# Patient Record
Sex: Female | Born: 1935
Health system: Southern US, Community
[De-identification: ages and names within clinical notes are randomized; demographics above are authoritative.]

## PROBLEM LIST (undated history)

## (undated) DIAGNOSIS — Z9889 Other specified postprocedural states: Secondary | ICD-10-CM

## (undated) DIAGNOSIS — F329 Major depressive disorder, single episode, unspecified: Secondary | ICD-10-CM

## (undated) DIAGNOSIS — Z8719 Personal history of other diseases of the digestive system: Secondary | ICD-10-CM

## (undated) DIAGNOSIS — N393 Stress incontinence (female) (male): Secondary | ICD-10-CM

## (undated) DIAGNOSIS — E785 Hyperlipidemia, unspecified: Secondary | ICD-10-CM

## (undated) DIAGNOSIS — M1990 Unspecified osteoarthritis, unspecified site: Secondary | ICD-10-CM

## (undated) DIAGNOSIS — K219 Gastro-esophageal reflux disease without esophagitis: Secondary | ICD-10-CM

## (undated) DIAGNOSIS — D649 Anemia, unspecified: Secondary | ICD-10-CM

## (undated) DIAGNOSIS — R112 Nausea with vomiting, unspecified: Secondary | ICD-10-CM

## (undated) DIAGNOSIS — R200 Anesthesia of skin: Secondary | ICD-10-CM

## (undated) DIAGNOSIS — K635 Polyp of colon: Secondary | ICD-10-CM

## (undated) DIAGNOSIS — J40 Bronchitis, not specified as acute or chronic: Secondary | ICD-10-CM

## (undated) DIAGNOSIS — I447 Left bundle-branch block, unspecified: Secondary | ICD-10-CM

## (undated) DIAGNOSIS — G250 Essential tremor: Secondary | ICD-10-CM

## (undated) DIAGNOSIS — Z86711 Personal history of pulmonary embolism: Secondary | ICD-10-CM

## (undated) DIAGNOSIS — F419 Anxiety disorder, unspecified: Secondary | ICD-10-CM

## (undated) DIAGNOSIS — I252 Old myocardial infarction: Secondary | ICD-10-CM

## (undated) DIAGNOSIS — C679 Malignant neoplasm of bladder, unspecified: Secondary | ICD-10-CM

## (undated) DIAGNOSIS — G5603 Carpal tunnel syndrome, bilateral upper limbs: Secondary | ICD-10-CM

## (undated) DIAGNOSIS — H9190 Unspecified hearing loss, unspecified ear: Secondary | ICD-10-CM

## (undated) DIAGNOSIS — J189 Pneumonia, unspecified organism: Secondary | ICD-10-CM

## (undated) DIAGNOSIS — R091 Pleurisy: Secondary | ICD-10-CM

## (undated) DIAGNOSIS — I10 Essential (primary) hypertension: Secondary | ICD-10-CM

## (undated) DIAGNOSIS — I2510 Atherosclerotic heart disease of native coronary artery without angina pectoris: Secondary | ICD-10-CM

## (undated) DIAGNOSIS — F32A Depression, unspecified: Secondary | ICD-10-CM

## (undated) HISTORY — DX: Major depressive disorder, single episode, unspecified: F32.9

## (undated) HISTORY — DX: Anxiety disorder, unspecified: F41.9

## (undated) HISTORY — PX: CATARACT EXTRACTION W/ INTRAOCULAR LENS  IMPLANT, BILATERAL: SHX1307

## (undated) HISTORY — DX: Depression, unspecified: F32.A

## (undated) HISTORY — DX: Left bundle-branch block, unspecified: I44.7

## (undated) HISTORY — PX: TRANSURETHRAL RESECTION OF BLADDER TUMOR: SHX2575

## (undated) HISTORY — DX: Hyperlipidemia, unspecified: E78.5

## (undated) HISTORY — DX: Gastro-esophageal reflux disease without esophagitis: K21.9

## (undated) HISTORY — PX: CARPAL TUNNEL RELEASE: SHX101

## (undated) HISTORY — DX: Essential (primary) hypertension: I10

## (undated) HISTORY — PX: CARDIOVASCULAR STRESS TEST: SHX262

## (undated) HISTORY — PX: OTHER SURGICAL HISTORY: SHX169

---

## 1968-03-18 HISTORY — PX: APPENDECTOMY: SHX54

## 1996-03-18 HISTORY — PX: LAPAROSCOPIC CHOLECYSTECTOMY: SUR755

## 1997-09-06 ENCOUNTER — Other Ambulatory Visit: Admission: RE | Admit: 1997-09-06 | Discharge: 1997-09-06 | Payer: Self-pay | Admitting: Gynecology

## 1997-09-14 ENCOUNTER — Ambulatory Visit (HOSPITAL_COMMUNITY): Admission: RE | Admit: 1997-09-14 | Discharge: 1997-09-14 | Payer: Self-pay | Admitting: Specialist

## 1997-11-16 ENCOUNTER — Ambulatory Visit (HOSPITAL_COMMUNITY): Admission: RE | Admit: 1997-11-16 | Discharge: 1997-11-16 | Payer: Self-pay | Admitting: Specialist

## 1998-06-22 ENCOUNTER — Other Ambulatory Visit: Admission: RE | Admit: 1998-06-22 | Discharge: 1998-06-22 | Payer: Self-pay | Admitting: Gynecology

## 1998-09-08 ENCOUNTER — Other Ambulatory Visit: Admission: RE | Admit: 1998-09-08 | Discharge: 1998-09-08 | Payer: Self-pay | Admitting: Gynecology

## 1999-02-05 ENCOUNTER — Encounter: Admission: RE | Admit: 1999-02-05 | Discharge: 1999-02-05 | Payer: Self-pay | Admitting: Gynecology

## 1999-07-30 ENCOUNTER — Encounter: Admission: RE | Admit: 1999-07-30 | Discharge: 1999-07-30 | Payer: Self-pay | Admitting: Gynecology

## 1999-07-30 ENCOUNTER — Encounter: Payer: Self-pay | Admitting: Gynecology

## 1999-09-10 ENCOUNTER — Other Ambulatory Visit: Admission: RE | Admit: 1999-09-10 | Discharge: 1999-09-10 | Payer: Self-pay | Admitting: Gynecology

## 2000-01-22 ENCOUNTER — Encounter: Admission: RE | Admit: 2000-01-22 | Discharge: 2000-01-22 | Payer: Self-pay | Admitting: Gynecology

## 2000-01-22 ENCOUNTER — Encounter: Payer: Self-pay | Admitting: Gynecology

## 2000-02-12 ENCOUNTER — Other Ambulatory Visit: Admission: RE | Admit: 2000-02-12 | Discharge: 2000-02-12 | Payer: Self-pay | Admitting: Gynecology

## 2000-08-12 ENCOUNTER — Encounter: Payer: Self-pay | Admitting: Gynecology

## 2000-08-12 ENCOUNTER — Encounter: Admission: RE | Admit: 2000-08-12 | Discharge: 2000-08-12 | Payer: Self-pay | Admitting: Gynecology

## 2000-09-11 ENCOUNTER — Other Ambulatory Visit: Admission: RE | Admit: 2000-09-11 | Discharge: 2000-09-11 | Payer: Self-pay | Admitting: Gynecology

## 2001-08-14 ENCOUNTER — Encounter: Payer: Self-pay | Admitting: Gynecology

## 2001-08-14 ENCOUNTER — Encounter: Admission: RE | Admit: 2001-08-14 | Discharge: 2001-08-14 | Payer: Self-pay | Admitting: Gynecology

## 2001-09-14 ENCOUNTER — Other Ambulatory Visit: Admission: RE | Admit: 2001-09-14 | Discharge: 2001-09-14 | Payer: Self-pay | Admitting: Gynecology

## 2002-10-28 ENCOUNTER — Encounter: Payer: Self-pay | Admitting: Gynecology

## 2002-10-28 ENCOUNTER — Encounter: Admission: RE | Admit: 2002-10-28 | Discharge: 2002-10-28 | Payer: Self-pay | Admitting: Gynecology

## 2002-11-09 ENCOUNTER — Ambulatory Visit (HOSPITAL_BASED_OUTPATIENT_CLINIC_OR_DEPARTMENT_OTHER): Admission: RE | Admit: 2002-11-09 | Discharge: 2002-11-09 | Payer: Self-pay | Admitting: Urology

## 2002-11-09 ENCOUNTER — Encounter (INDEPENDENT_AMBULATORY_CARE_PROVIDER_SITE_OTHER): Payer: Self-pay

## 2003-03-19 HISTORY — PX: KNEE ARTHROSCOPY: SUR90

## 2003-07-01 ENCOUNTER — Inpatient Hospital Stay (HOSPITAL_COMMUNITY): Admission: RE | Admit: 2003-07-01 | Discharge: 2003-07-04 | Payer: Self-pay | Admitting: Orthopaedic Surgery

## 2003-07-01 HISTORY — PX: TOTAL HIP ARTHROPLASTY: SHX124

## 2003-07-04 ENCOUNTER — Inpatient Hospital Stay (HOSPITAL_COMMUNITY)
Admission: RE | Admit: 2003-07-04 | Discharge: 2003-07-08 | Payer: Self-pay | Admitting: Physical Medicine & Rehabilitation

## 2003-12-02 ENCOUNTER — Encounter: Admission: RE | Admit: 2003-12-02 | Discharge: 2003-12-02 | Payer: Self-pay | Admitting: Gynecology

## 2003-12-05 ENCOUNTER — Other Ambulatory Visit: Admission: RE | Admit: 2003-12-05 | Discharge: 2003-12-05 | Payer: Self-pay | Admitting: Gynecology

## 2004-01-02 ENCOUNTER — Inpatient Hospital Stay (HOSPITAL_COMMUNITY): Admission: RE | Admit: 2004-01-02 | Discharge: 2004-01-06 | Payer: Self-pay | Admitting: Orthopaedic Surgery

## 2004-01-02 HISTORY — PX: OTHER SURGICAL HISTORY: SHX169

## 2005-01-03 ENCOUNTER — Encounter: Admission: RE | Admit: 2005-01-03 | Discharge: 2005-01-03 | Payer: Self-pay | Admitting: Gynecology

## 2005-05-30 ENCOUNTER — Encounter: Admission: RE | Admit: 2005-05-30 | Discharge: 2005-05-30 | Payer: Self-pay | Admitting: Urology

## 2005-06-04 ENCOUNTER — Ambulatory Visit (HOSPITAL_BASED_OUTPATIENT_CLINIC_OR_DEPARTMENT_OTHER): Admission: RE | Admit: 2005-06-04 | Discharge: 2005-06-04 | Payer: Self-pay | Admitting: Urology

## 2005-06-04 ENCOUNTER — Encounter (INDEPENDENT_AMBULATORY_CARE_PROVIDER_SITE_OTHER): Payer: Self-pay | Admitting: *Deleted

## 2006-02-13 ENCOUNTER — Encounter: Admission: RE | Admit: 2006-02-13 | Discharge: 2006-02-13 | Payer: Self-pay | Admitting: Gynecology

## 2006-02-26 ENCOUNTER — Other Ambulatory Visit: Admission: RE | Admit: 2006-02-26 | Discharge: 2006-02-26 | Payer: Self-pay | Admitting: Gynecology

## 2007-06-30 ENCOUNTER — Encounter: Admission: RE | Admit: 2007-06-30 | Discharge: 2007-06-30 | Payer: Self-pay | Admitting: Gynecology

## 2007-11-30 ENCOUNTER — Inpatient Hospital Stay (HOSPITAL_COMMUNITY): Admission: RE | Admit: 2007-11-30 | Discharge: 2007-12-04 | Payer: Self-pay | Admitting: Orthopedic Surgery

## 2007-11-30 HISTORY — PX: TOTAL KNEE ARTHROPLASTY: SHX125

## 2009-01-13 ENCOUNTER — Encounter: Admission: RE | Admit: 2009-01-13 | Discharge: 2009-01-13 | Payer: Self-pay | Admitting: Gynecology

## 2009-01-24 ENCOUNTER — Encounter: Admission: RE | Admit: 2009-01-24 | Discharge: 2009-01-24 | Payer: Self-pay | Admitting: Gynecology

## 2009-02-28 ENCOUNTER — Ambulatory Visit (HOSPITAL_BASED_OUTPATIENT_CLINIC_OR_DEPARTMENT_OTHER): Admission: RE | Admit: 2009-02-28 | Discharge: 2009-02-28 | Payer: Self-pay | Admitting: Urology

## 2010-01-04 ENCOUNTER — Encounter: Admission: RE | Admit: 2010-01-04 | Discharge: 2010-01-04 | Payer: Self-pay | Admitting: Gynecology

## 2010-06-19 LAB — POCT I-STAT 4, (NA,K, GLUC, HGB,HCT)
HCT: 38 % (ref 36.0–46.0)
Hemoglobin: 12.9 g/dL (ref 12.0–15.0)
Sodium: 142 mEq/L (ref 135–145)

## 2010-07-31 NOTE — Op Note (Signed)
NAME:  [name], Christina               ACCOUNT NO.:  [id number]   MEDICAL RECORD NO.:  [id number]          PATIENT TYPE:  INP   LOCATION:  0003                         FACILITY:  [facility]   PHYSICIAN:  Ollen Gross, M.D.    DATE OF BIRTH:  19-Jul-1925   DATE OF PROCEDURE:  11/30/2007  DATE OF DISCHARGE:                               OPERATIVE REPORT   PREOPERATIVE DIAGNOSIS:  Osteoarthritis left knee.   POSTOPERATIVE DIAGNOSIS:  Osteoarthritis left knee.   PROCEDURE:  Left total knee arthroplasty.   SURGEON:  Ollen Gross, M.D.   ASSISTANT:  Jamelle Rushing, P.A.-C.   ANESTHESIA:  Spinal.   ESTIMATED BLOOD LOSS:  Minimal.   DRAINS:  None.   TOURNIQUET TIME:  31 minutes at 300 mmHg.   COMPLICATIONS:  None.   CONDITION:  Stable to recovery.   CLINICAL NOTE:  [name] is a 77 year old female who has end-stage  arthritis of the left knee with progressively worsening pain and  dysfunction.  She has failed nonoperative management and presents now  for left total knee arthroplasty.   PROCEDURE IN DETAIL:  After successful administration of spinal  anesthetic, a tourniquet was placed on her left thigh and left lower  extremity prepped and draped in the usual sterile fashion.  Extremity  was wrapped in Esmarch, knee flexed, tourniquet inflated to 300 mmHg.  Midline incision was made with a 10 blade through subcutaneous tissue to  the level of the extensor mechanism.  A fresh blade was used make a  medial parapatellar arthrotomy.  Soft tissue over the proximal medial  tibia was subperiosteally elevated to the joint line with the knife and  into the semimembranosus bursa with a Cobb elevator.  Soft tissue  laterally was elevated with attention being paid to avoiding the patella  tendon on the tibial tubercle.  Patella subluxed laterally, knee flexed  90 degrees, and ACL and PCL removed.  Drill was used to create a  starting hole in the distal femur, and the canal was thoroughly  irrigated.  The 5 degrees left valgus alignment guide was placed and  referencing off the posterior condyles rotation was marked and a block  pinned to remove 10 mm off the distal femur.  Distal femoral resection  was made with an oscillating saw.  Sizing blocks placed; size 2 was most  appropriate.  Rotation was marked off the epicondylar axis.  The size  two cutting block was placed and the anterior, posterior, chamfer cuts  made.   Tibia subluxed forward and menisci were removed.  Extramedullary tibial  alignment guide was placed referencing proximally at the medial aspect  of the tibial tubercle and distally along the second metatarsal axis  tibial crest.  Block was initially pinned to remove 10 mm off the  nondeficient lateral side.  This did not get down to the base of the  medial defect.  We had to go down further to get the base of the medial  defect.  Tibial resection was made with an oscillating saw.  Size 2 was  the most appropriate tibial component and the  proximal tibia prepared  with the modular drill and keel punch for size 2.  Femoral preparation  was completed with the intercondylar cut.   Size 2 mobile bearing tibial trial and a size 2 posterior stabilized  femoral trial and a 12.5-mm posterior stabilized rotating platform  insert trial were placed.  A 12.5 had just a little hyperextension and  some AP laxity.  Went to a 15 which allowed for full extension with  excellent varus-valgus, anterior and posterior balance throughout full  range of motion.  The patella was everted, thickness measured to be 20  mm.  Freehand resection was taken to 12 mm, 32 template was placed, lug  holes were drilled, trial patella was placed, and it tracked normally.  Osteophytes were removed off the posterior femur with the trial in  place.  All trials were removed and the cut bone surfaces were prepared  with pulsatile lavage.  Cement was mixed, and once ready for  implantation, a size 2  mobile bearing tibia, size 2 posterior stabilized  femur, and 32 patella were cemented into place.  Patella was held with a  clamp.  Trial 15-mm insert was placed, knee held in full extension, and  all extruded cement removed.  When the cement was fully hardened, then a  he permanent 15-mm posterior stabilized rotating platform insert was  placed in the tibial tray.  Wounds were copiously irrigated with saline  solution and FloSeal then injected on the posterior capsule and medial  and lateral gutters and suprapatellar area.  Moist sponge was placed.  Tourniquet released with a total time of 31 minutes.  Sponges held for 2  minutes, then removed.  Minimal bleeding was encountered and that which  was encountered was stopped with electrocautery.  He was again irrigated  with saline solution and the arthrotomy closed with interrupted #1 PDS.  Flexion against gravity was 140 degrees.  Subcutaneous was closed with  interrupted 2-0 Vicryl and subcuticular running 4-0 Monocryl.  The  incisions were cleaned and dried, and Steri-Strips and a bulky sterile  dressing were applied.  She was then placed into a knee immobilizer,  awakened and transported to recovery in stable condition.      Ollen Gross, M.D.  Electronically Signed     FA/MEDQ  D:  11/30/2007  T:  11/30/2007  Job:  [id number]

## 2010-07-31 NOTE — Discharge Summary (Signed)
NAME:  Christina, [name]               ACCOUNT NO.:  [id number]   MEDICAL RECORD NO.:  [id number]          PATIENT TYPE:  INP   LOCATION:  1609                         FACILITY:  [facility]   PHYSICIAN:  Ollen Gross, M.D.    DATE OF BIRTH:  09/30/1925   DATE OF ADMISSION:  11/30/2007  DATE OF DISCHARGE:  12/04/2007                               DISCHARGE SUMMARY   ADMISSION DIAGNOSES:  1. Osteoarthritis, left knee.  2. Tinnitus.  3. Anxiety.  4. Essential tremor.  5. History bronchitis.  6. Hypertension.  7. Small abdominal aneurysm per magnetic resonance imaging which is      followed by Dr. Sherlyn Lick.  8. Hiatal hernia.  9. Reflux disease.  10.Stress urinary incontinence.  11.Osteoporosis.  12.Degenerative disk disease.   DISCHARGE DIAGNOSES:  1. Osteoarthritis of the left knee, status post left total knee      replacement arthroplasty.  2. Mild postoperative blood loss anemia.  3. Postoperative hypokalemia, improved.  4. Tinnitus.  5. Anxiety.  6. Essential tremor.  7. History bronchitis.  8. Hypertension.  9. Small abdominal aneurysm per magnetic resonance imaging which is      followed by Dr. Sherlyn Lick.  10.Hiatal hernia.  11.Reflux disease.  12.Stress urinary incontinence.  13.Osteoporosis.  14.Degenerative disk disease.   PROCEDURE:  November 30, 2007, left total knee.   SURGEON:  Ollen Gross, M.D.   ASSISTANT:  Jamelle Rushing, P.A.   ANESTHESIA:  Spinal.   CONSULTATIONS:  None.   BRIEF HISTORY:  Christina [name] is a 77 year old female who has end-stage  arthritis of the left knee, progressive worsening pain and dysfunction,  who has failed operative management, now presents for total knee  arthroplasty.   LABORATORY DATA:  Preoperative CBC showed a hemoglobin of 14.1,  hematocrit 42.8, white cell count 5.8, platelets 179, PT/INR 14.1 and  1.1 with PTT 28 preoperatively.  Preop UA was negative.  Chem panel:  Minimally elevated BUN of 25.  Remaining Chem panel all  within normal  limits.  Serial BMETs were followed.  Potassium did drop down to 3.3,  came back up to 3.8.  BUN improved down to 11, last noted at 17.  Serial  CBCs were followed.  Hemoglobin dropped down to 10.9 and then to 10.3  last noted.  The lowest it got was 9.9 with hematocrit of 29.5, last H&H  came back up 10.8 and 32.0.   DIAGNOSTICS:  1. Chest x-ray November 20, 2007:  Stable exam, demonstrating no      acute findings, moderate size hiatal hernia.  2. EKG dated September 29, 2007:  Left bundle branch block, sinus rhythm,      no significant change since EKG of March 2008.   HOSPITAL COURSE:  The patient was admitted to [facility] and  tolerated the procedure well.  Later transferred to the recovery room  and the orthopedic floor.  Started on PCA and p.o. analgesic for pain  control following surgery.  Did have a little bit of nausea in the  evening surgery morning of day 1, treated with antibiotics.  Had decent  output, a little on the  lower side, so we gave some gentle fluids.  Hemoglobin was 10.  Started getting up out of bed.  Started back on her  home meds.  By day 2, doing a little bit better with the nausea.  However, when she was getting up with therapy, she had a little episode  of lightheadedness, but never had a blackout, never had a syncopal  episode, was a little presyncopal episode though, started getting  lightheaded and a little bit of intermittent nausea with that.  It  sounded like she vagaled when she got up.  We checked her vitals and her  pressure was a little low, about 99/58, O2 sats were fine.  I gave her  little bit of gentle fluids and checked her vitals more closely.  She  remained stable beyond that point and did not have any further episodes.  Hemoglobin was 10.3 at that time.  Started getting up with therapy.  We  did want to look for a skilled facility.  The family was in agreement.  They had contacted [facility], so social work  got involved and  they did actually have a bed, so we decided she would be transferred at  that time.  She had a little  low-grade temperature on the evening of postop day 2.  It was back down  by the morning of day 3 and did well after that.  Encouraged incentive  spirometer, felt to be just a little bit of low-grade cycling  temperature from being out of the bed.  Hemoglobin is 9.9 on day 3.  Dressing was changed on day 2 and incision looked good.  Continued to  heal well.  By day 4, she had developed some small little blisters,  about the size of a quarter at the distal incision, but no signs of  infection.  She was seen on the morning of day 4, she was doing well,  tolerating her meds and decided to be transferred to [facility]  at that time.   DISCHARGE/PLAN:  The patient was transferred to [facility] in [place] on  December 04, 2007.   DISCHARGE MEDICATIONS:  1. Coumadin protocol.  Please titrate the Coumadin level for a target      INR between 2.0 and 3.0.  She needs to be on Coumadin for 3 weeks      from date of surgery of November 30, 2007.  2. Colace 100 mg p.o. b.i.d.  3. Zetia 10 mg p.o. daily.  4. Lasix 20 mg p.o. daily.  5. Aldactone 12.5 mg p.o. daily.  6. Xanax 0.5 mg p.o. daily.  7. Prometrium 200 mg p.o. daily.  8. Zoloft 50 mg daily.  9. Prilosec 20 mg daily.  10.Nu-Iron 150 mg p.o. daily for 3 weeks, then discontinue the iron.  11.Percocet 5 mg 1-2 every 4 hours as needed for pain.  12.Tylenol 325 1-2 every 4-6 h. as needed for mild pain or temporal      headache.  13.Robaxin 500 mg p.o. q.6-8 h. p.r.n. spasm.   DIET:  Low-sodium, heart-healthy diet.   ACTIVITY:  She can be weight bearing as tolerated to the left lower  extremity, total knee protocol.  Range of motion and strengthening  exercises.  PT and OT for gait training, ambulation and ADLs.   DISCHARGE INSTRUCTIONS:  1. She may start showering, however, do not submerge the incision       under water.  2. Daily dressing  change, dry dressing.   FOLLOW UP:  She is to follow up with Dr. Lequita Halt in the office in  approximately 2 weeks from the date of surgery.  Please contact the  office at [phone] to help arrange appointment and transfer of the  patient over for follow up care at the [facility]  [facility].   DISPOSITION:  She is going to [facility].   CONDITION ON DISCHARGE:  Improving.      Alexzandrew L. Perkins, P.A.C.      Ollen Gross, M.D.  Electronically Signed    ALP/MEDQ  D:  12/04/2007  T:  12/04/2007  Job:  [id number]   cc:   Ollen Gross, M.D.  Fax: [phone]   Malkiat Dhatt, M.D.  Fax: [phone]   Gretta Cool, M.D.  Fax: [phone]   [facility]

## 2010-07-31 NOTE — H&P (Signed)
NAME:  Christina [name], [name]               ACCOUNT NO.:  [id number]   MEDICAL RECORD NO.:  [id number]          PATIENT TYPE:  INP   LOCATION:  NA                           [facility]ACILITY[facility]  [facility]   PHYSICIAN:  Ollen Gross, M.D.    DATE OF BIRTH:  Jun 06, 1925   DATE OF ADMISSION:  11/30/2007  DATE OF DISCHARGE:                              HISTORY & PHYSICAL   DATE OF OFFICE VISIT:  June 20, 2007.   CHIEF COMPLAINT:  Left knee pain.   HISTORY OF PRESENT ILLNESS:  The patient 77 year old female who has been  seen by Dr. Lequita Halt for ongoing left knee pain.  She was initially  evaluated in the office and found to have bone-on-bone arthritis in the  medial patellofemoral compartments on the left knee.  She has had a  right knee done and appears to be doing well.  She has significant  pseudoarthritis.  Felt to be a good candidate.  Risks and benefits  discussed.  The patient subsequently admitted to the hospital.  She has  been seen by Dr. Sherlyn Lick and felt to be stable for surgery.   ALLERGIES:  None.   CURRENT MEDICATIONS:  Furosemide, Aldactone, Xanax, Prilosec, Vicodin,  Prometrium, Vivelle patch, Estrace cream, vitamin D3, Citracal,  sertraline, and fish oil tabs.   PAST MEDICAL HISTORY:  1. Tinnitus.  2. Anxiety.  3. Essential tremor.  4. History of bronchitis.  5. Hypertension.  6. Small abdominal aneurysm per MRI which is followed by Dr. Sherlyn Lick.  7. Hiatal hernia.  8. Reflux disease.  9. Stress urinary incontinence.  10.Osteoporosis.  11.Degenerative disk disease.   PAST SURGICAL HISTORY:  1. Tubal ligation.  2. Laparoscopic gallbladder.  3. Bilateral cataract surgery.  4. Right knee scope.  5. Right hip replacement.  6. Right knee replacement.   FAMILY HISTORY:  Father deceased with heart disease.  Mother deceased  with heart disease.  Brother deceased with heart disease and a twin  sister deceased with history of stroke.   SOCIAL HISTORY:  Widowed, retired Airline pilot,  nonsmoker.  No alcohol.  She wants to look into skilled rehab facility, particularly the [facility]  [facility]urs[facility] facility in [place].   REVIEW OF SYSTEMS:  GENERAL:  No fevers, chills or night sweats.  NEUROLOGIC:  No seizures, syncope or paralysis.  RESPIRATORY:  No  shortness of breath, cough or hemoptysis.  CARDIOVASCULAR:  No chest  pain.  GI:  No nausea, vomiting, diarrhea, or constipation.  GU:  No  dysuria, hematuria or discharge.  Little bit of stress urinary  incontinence.  MUSCULOSKELETAL:  Left knee.   PHYSICAL EXAMINATION:  VITAL SIGNS:  Pulse 68, respirations 14, blood  pressure 140/88.  GENERAL:  A 77 year old small frame, petite, white female, well-  nourished, well-developed, in no acute distress.  Good historian.  HEENT:  Normocephalic, atraumatic.  Pupils are round and reactive.  Oropharynx clear.  EOMs intact.  NECK:  Supple.  No bruits.  CHEST:  Clear.  HEART:  Regular rate and rhythm with a very faint early systolic  ejection murmur over the pulmonic point.  ABDOMEN:  Soft,  nontender.  Bowel sounds present.  BREASTS/RECTAL/GENITALIA:  Not done, not pertinent to present illness.  EXTREMITIES:  Left knee slight varus, range of motion 10-110, marked  crepitus, tender more medial than lateral.   IMPRESSION:  Osteoarthritis, left knee.   PLAN:  The patient admitted to [facility] to undergo a left  total knee replacement arthroplasty.  Surgery will be performed by Dr.  Ollen Gross.      Alexzandrew L. Perkins, P.A.C.      Ollen Gross, M.D.  Electronically Signed    ALP/MEDQ  D:  11/29/2007  T:  11/30/2007  Job:  [id number]   cc:   Harl Bowie, M.D.  Fax: [phone]   Gretta Cool, M.D.  Fax: [phone]   Ollen Gross, M.D.  Fax: [phone]

## 2010-08-03 NOTE — Discharge Summary (Signed)
NAME:  Christina [name], Christina [name]               ACCOUNT NO.:  [id number]   MEDICAL RECORD NO.:  [id number]          PATIENT TYPE:  INP   LOCATION:  5035                         FACILITY:  [facility]   PHYSICIAN:  Mark C. Ophelia Charter, M.D.    DATE OF BIRTH:  09-Jan-1926   DATE OF ADMISSION:  01/02/2004  DATE OF DISCHARGE:  01/06/2004                                 DISCHARGE SUMMARY   ADMITTING DIAGNOSES:  Right knee osteoarthritis.   PERTINENT HISTORY:  Ms. Peake is a very pleasant 77 year old female with a  long-standing history of right knee pain.  She has been having increased  swelling, progressively worsening pain, and decreased ability to ambulate.  She has had no response to conservative treatment measures.  She has also  had a significant decrease in her daily activity level due to this knee  pain.  X-rays taken have shown advanced degenerative changes with moderate  to advanced medial and lateral joint line narrowing of the right knee.  A  right total knee arthroplasty was recommended and agreed upon.   SPECIAL PROCEDURES:  Patient underwent a right total knee arthroplasty on  January 02, 2004.  Surgeon, Annell Greening, M.D.  Assistant, Sandrea Matte, P.A.-  C.  The patient tolerated this procedure well.  There were no complications.  Please see operative note for details.   HOSPITAL COURSE:  Patient admitted January 02, 2004.  Underwent a right  total knee arthroplasty.  There were no complications.  Please see operative  note for details.  January 03, 2004 patient's laboratories were as follows.  Hemoglobin 10.2, hematocrit 29.3.  INR 1.1.  Patient was having increased  pain today.  However, she had not been using her PCA morphine.  PCA pump was  encouraged.  January 04, 2004 patient's laboratories were as follows.  Hemoglobin 9.6.  INR 1.5.  Patient continued to complain of pain today and  was still not using her PCA pump.  PCA was discontinued and her OxyContin  was changed from 10 mg p.o. b.i.d.  to 20 mg p.o. b.i.d.  Physical therapy  worked with the patient today and had her out of bed to chair and also had  her standing.  She did ambulate 12 feet today with a rolling walker.  CPM  machine was continued.  Patient was given 3 mg of Coumadin today per  pharmacy.  The patient's oxygen discontinued today.  Her IV was discontinued  and a saline lock was placed.  Plexipulses were discontinued today.  January 05, 2004 patient's laboratories were as follows.  Hemoglobin 9.1.  INR 2.1.  Patient progressed well in therapy today ambulating 20 feet with her rolling  walker.  Pain rated as moderate today.  Patient was given Coumadin 3 mg  today.  January 06, 2004 patient's laboratories were as follows.  Hemoglobin  8.5, hematocrit 24.4.  INR 2.2.  The patient's INR goal was 2.0-3.0 and she  is now therapeutic.  She was given 2 mg of Coumadin today.  In therapy the  patient ambulated 55 feet with a rolling walker, was progressing well.  CPM  machine up around 50 degrees.  The patient was able to flex her right knee  to 67 degrees.  Patient was complaining of mild nausea and dizziness today  and stated that she has not had a bowel movement.  She has her postoperative  rehabilitation therapy set up at [facility] in [place] and will be  discharged to [facility] skilled [facility] facility today.  Since she has not had  a bowel movement, she will get an enema of choice or suppository of choice  this afternoon or this evening when she is at [facility].  I will also start her  on iron sulfate 325 mg p.o. b.i.d. with meals secondary to her dizziness and  her hemoglobin of 8.5.  She will continue on this iron for a total of two  weeks, then she can resume her multivitamin.   DISCHARGE MEDICATIONS:  1.  OxyContin 20 mg p.o. q.12h.  2.  Tylox one to two tablets p.o. q.4-6h. p.r.n. pain.  3.  Coumadin per pharmacy protocol x4 weeks total.  4.  Restoril 30 mg p.o. q.h.s. p.r.n. sleep.  5.  Iron sulfate  325 mg p.o. b.i.d. with meals for a total of two weeks.  6.  Colace 100 mg p.o. b.i.d.  7.  Resume home medications.   SPECIAL INSTRUCTIONS:  Patient is to have an enema of choice or suppository  of choice today as soon as she gets to [facility] nursing facility.  I do want  her to have a bowel movement some time today before she goes to sleep since  she is having slight cramping and nausea.  I am going to start her on iron  sulfate 325 mg p.o. b.i.d. with meals secondary to slight dizziness and a  hemoglobin of 8.5.  She will continue daily physical therapy working on  range of motion of her knee and progressive ambulation.  She will continue  her CPM machine increasing the flexion degrees daily.  She will continue  wearing her ted hose while at [facility] nursing facility.  She will continue  Coumadin and PT/INR monitoring for a total of four weeks.  Dressing to be  changed daily and her dressing will be changed before discharge.  She will  follow up with Dr. Annell Greening at [facility] in two weeks and she  already has an appointment set up.  Continue total knee arthroplasty,  physical therapy protocol/procedures and total knee arthroplasty  precautions.  Staples to be taken out two to three weeks postoperatively.   CONDITION ON DISCHARGE:  Stable.   DISCHARGE DISPOSITION:  To [facility] skilled nursing facility in [place].   DISCHARGE DIAGNOSES:  Status post right total knee arthroplasty.       JH/MEDQ  D:  01/06/2004  T:  01/06/2004  Job:  [id number]

## 2010-08-03 NOTE — Op Note (Signed)
NAME:  Christina [name], Christina [name]               ACCOUNT NO.:  [id number]   MEDICAL RECORD NO.:  [id number]          PATIENT TYPE:  INP   LOCATION:  5035                         FACILITY:  [facility]   PHYSICIAN:  Mark C. Ophelia Charter, M.D.    DATE OF BIRTH:  14-Jan-1926   DATE OF PROCEDURE:  01/02/2004  DATE OF DISCHARGE:                                 OPERATIVE REPORT   PREOPERATIVE DIAGNOSIS:  Right knee osteoarthritis.   POSTOPERATIVE DIAGNOSIS:  Right knee osteoarthritis.   PROCEDURE:  Right total knee arthroplasty.   SURGEON:  Mark C. Ophelia Charter, M.D.   ASSISTANT:  Sandrea Matte, P.A.-C.   ANESTHESIA:  GOT, preoperative femoral nerve block.   ESTIMATED BLOOD LOSS:  100 mL.   TOURNIQUET TIME:  1 hour 3 minutes.   PROCEDURE:  After induction of general anesthesia and preoperative femoral  nerve block, standard prepping and draping, inspection of the knee revealed  no significant varus or valgus deformity.  She had within a few degrees of  full extension with no significant flexion or deformity.  The limb was  wrapped with an Esmarch and the tourniquet was inflated.  A midline incision  was made and VMO was split in line with its fibers.  The patella was cut  initially and sized for a number 5.  The femur was sized for a number 5.  After intramedullary hole was drilled in the femur, the distal cut was made.  An intramedullary hole was drilled in the tibia, AP cut was made on the  tibia, and then chamfer cuts, notch cuts, on the femur based on number 5  size.  The tibia was number 5, as well.  The trials were inserted and a 10  mm bearing was placed.  Rotation was checked and rotation was marked.  After  pulsatile irrigation and keel hole preparation for the tibia, pulsatile  lavage was made, cement was vacuum mixed, inserted in the tibia first,  followed by the femur, and then patella.  The trial insert was not used.  The permanent 10 mm insert was inserted.  All excess cement had been  removed.  Care  was taken to make sure there was no cement in the notch.  After dropping the tourniquet, the cement was hard, hemostasis was obtained.  Closure was performed.  #1 Tycron was used for closure of the capsule and  repair of the VMO.  2-0 Vicryl was used in the subcutaneous tissue, and skin  stapled close. Marcaine infiltration.  Skin staples and postop dressing with  knee immobilizer.  Instrument count and needle counts were correct.      MCY/MEDQ  D:  01/02/2004  T:  01/02/2004  Job:  [id number]

## 2010-08-03 NOTE — Discharge Summary (Signed)
NAME:  Christina [name], Christina [name]                         ACCOUNT NO.:  [id number]   MEDICAL RECORD NO.:  [id number]                   PATIENT TYPE:  INP   LOCATION:  5006                                 FACILITY:  [facility]   PHYSICIAN:  Christina [name], M.D.                 DATE OF BIRTH:  11-Nov-1925   DATE OF ADMISSION:  07/01/2003  DATE OF DISCHARGE:  07/04/2003                                 DISCHARGE SUMMARY   ADMITTING DIAGNOSIS:  Advanced osteoarthritis, lateral right hip.   PERTINENT HISTORY:  Christina [name] is a 77 year old female with continuing pain  in the right hip that is mainly felt when ambulating.  She has failed  conservative treatment measures, and this pain is affecting her activities  of daily living.  X-rays taken showed bone on bone degenerative changes of  the right hip.  A right total hip arthroplasty was discussed and agreed  upon.   OPERATIONS:  The patient underwent right total hip arthroplasty on July 01, 2003.  The patient tolerated the procedure well.  Please see operative note  for details.   SURGEON:  Christina [name].   ASSISTANT:  Christina [name], P.A.-C.   HOSPITAL COURSE:  Coumadin was started postoperatively, in a routine  fashion.  Pharmacy was consulted to dose this.   On July 02, 2003, postoperative day #1, the patient was doing well.  Sensations were intact.  She had good dorsiflexion and plantar flexion of  her foot.  Vital signs were stable.  She was afebrile.  The O2 that was  given overnight was discontinued. Her hemoglobin was 10.3, hematocrit was  29.6, INR 1.2.   She was given a 5 mg dose of Coumadin today.   LABORATORY DATA:  On July 03, 2003, labs were followed.  Hemoglobin 10.0,  hematocrit 29.1, INR 1.9.  The patient had a slight temperature today of  101.  The nurses were worried that she may have developed some type of  infection, possible pneumonia.  She was using her incentive spirometry every  two hours as instructed.  A portable  chest x-ray was ordered, and did come  back normal.  PT worked with the patient to get her out of bed to a  recliner.  The patient was given a 1 mg dose of Coumadin today due to the  increased in INR from the previous day.   On July 04, 2003, the patient's labs were as follows:  Hemoglobin and  hematocrit 9.0 and 26.1.  INR 2.2.  Temperature today was down to 99.4.  The  patient was up moving around today, and was feeling quite well.  Rehabilitation has evaluated the patient.  She was deemed appropriate for  rehab admission.   The patient was given 3 mg of Coumadin today.  The patient was discharged to  rehabilitation today on July 04, 2003, in stable condition.  Her pain was  controlled.  She was progressing well in therapy.  She continued to progress  well in rehabilitation.   DISCHARGE MEDICATIONS:  1. Tylox one to two tablets q.4-6h. p.r.n. pain.  2. Coumadin per pharmacy dosing.  3. Plendil 5 mg q.a.m.  4. Triamterene hydrochlorothiazide 25 mg, one-half tablet q.o.d.  5. __________ 25 mg q.a.m.  6. Multivitamin q.a.m.  7. Xanax 0.5 mg one to two tablets t.i.d. p.r.n.  8. Estrace vaginal cream apply Monday, Wednesday and Friday.  9. Advair Diskus, 250/50, one puff [name].i.d.  10.      Rhinocort 32 micrograms [name].i.d. nasal spray.  11.      Nexium 40 mg q.p.m.  12.      Claritin 10 mg q.p.m.   DISCHARGE CONDITION:  Stable.   DISCHARGE DISPOSITION:  To rehabilitation.   DISCHARGE DIAGNOSES:  Status post right total hip arthroplasty.   SPECIAL INSTRUCTIONS:  1. The patient will follow up with Dr. Ophelia [name] two weeks postoperatively.     Staples will be taken out at this time, and Steri-Strips were applied.     Preoperatively, the patient had talked about going to __________Nursing     Home in [place] for a week or two after her rehab stay.  She may or may     not have to do this, depending on how well she progresses in rehab.  If     she does not go here, she will be getting Home  Health PT three times a     week.  2. Coumadin will be taken for a total of four weeks.  3. She is to leave her TED hose on until further recommendations when we see     her in the office.      Christina [name], P.A.                       Christina [name], M.D.    JH/MEDQ  D:  08/02/2003  T:  08/02/2003  Job:  [id number]

## 2010-08-03 NOTE — Op Note (Signed)
NAME:  Christina [name], Christina [name]               ACCOUNT NO.:  [id number]   MEDICAL RECORD NO.:  [id number]          PATIENT TYPE:  AMB   LOCATION:  NESC                         FACILITY:  [facility]   PHYSICIAN:  Boston Service, M.D.DATE OF BIRTH:  08-Jul-1925   DATE OF PROCEDURE:  06/04/2005  DATE OF DISCHARGE:                                 OPERATIVE REPORT   PREOPERATIVE DIAGNOSIS:  Low grade noninvasive transitional cell carcinoma.   POSTOPERATIVE DIAGNOSIS:  Low grade noninvasive transitional cell carcinoma.   PROCEDURE:  Cystoscopy, retrograde TURBT.   SURGEON:  Boston Service, M.D.   ASSISTANT:  None.   ANESTHESIA:  General.   SPECIMENS:  BT.   ESTIMATED BLOOD LOSS:  Minimal.   COMPLICATIONS:  None obvious.   INDICATIONS FOR PROCEDURE:  A 77 year old female with a history of low grade  noninvasive TCCA,  TURBT August 2004, chest x-ray and CT of the abdomen and  pelvis done prior to initial resections showed no evidence of extravesical  disease. Recent cystoscopy showed polypoid erythematous patch adjacent to  the left ureteral orifice. Plans made for resection and biopsy.   PROCEDURE:  Cystoscopy, retrograde TURBT,  TUR VaporTrode.   DESCRIPTION OF PROCEDURE:  The patient was prepped and draped in the dorsal  lithotomy position and after institution of an adequate level of general  anesthesia, a 21-French panendoscope was gently inserted at the urethral  meatus, normal urethra and sphincter. Clear efflux right and left orifice,  erythematous patch noted lateral to the right ureteral orifice, retrograde  ureteral catheter was selected, positioned at the right ureteral orifice  with gentle injection of 3-6 mL of contrast. The ureter, pelvis and calyces  were promptly outlined without filling defect or obstruction. A similar  technique was used on the left side. Both sides drained out promptly at 3-5  minutes. With ureteral catheters in place, the lesion adjacent to the right  ureteral orifice was first biopsied and then resected, adequate hemostasis  obtained with the VaporTrode. Care was taken to avoid injury to the  intramural ureter. Once all suspicious  urothelium had been either resected or cauterized, ureteral catheters were  withdrawn. Prompt efflux at both ureteral orifices, bladder was drained,  resectoscope sheath was removed and the patient was returned to recovery in  satisfactory condition.           ______________________________  Boston Service, M.D.     RH/MEDQ  D:  06/04/2005  T:  06/05/2005  Job:  [id number]   cc:   Gretta Cool, M.D.  Fax: [phone]   Malkiat Dhatt, M.D.  Fax: [phone]

## 2010-08-03 NOTE — Op Note (Signed)
NAME:  Cregg, Eulla B                         ACCOUNT NO.:  [id number]   MEDICAL RECORD NO.:  [id number]                   PATIENT TYPE:  AMB   LOCATION:  NESC                                 FACILITY:  [facility]   PHYSICIAN:  Boston Service, M.D.             DATE OF BIRTH:  23-Aug-1925   DATE OF PROCEDURE:  11/09/2002  DATE OF DISCHARGE:                                 OPERATIVE REPORT   INDICATIONS:  A 77 year old white female, bladder tumor x2, has had careful  followup with Dr. Sherlyn Lick and Dr. Nicholas Lose.  The patient related passage of a  small clot in July 2004.  Careful office followup showed persistent  microhematuria August 2004. Urology consult was requested.  Cystoscopy  showed bladder tumor, right and left side.  CT scan of the abdomen and  pelvis pending at the time of this dictation.  Plans made for cystoscopy  under anesthesia, retrograde and TURBT.   DESCRIPTION OF PROCEDURE:  The patient was prepped and draped in the dorsal  lithotomy position after institution of an adequate level of general  anesthesia.  Well-lubricated, flexible panendoscope was gently inserted at  the urethral meatus. Normal urethra sphincter, trigone and orifices.  Papillary tumor identified above both the right and left ureteral orifice.  No additional tumors were identified.  Rigid panendoscope was inserted.  Right and left retrogrades were performed using the blocking 10-French  catheter.  No evidence of filling defect or obstruction on either the right  or left sides.  Resectoscope sheath was then inserted with ureteral catheter  in place in the right ureteral orifice.  Tumor was resected between the 7  o'clock and 8 o'clock position, taking care to avoid injury to the trigone  or intramural ureter.  Similar technique was used on the left side.  Tissue  was then sent to pathology.  TUR loop was then replaced with the VaporTrode  element.  Adequate hemostasis was obtained.  Ureteral catheter  remained in  place in both cases with removal of the ureteral catheter.  There was prompt  efflux of pink urine at both orifices.  No evidence of obstruction.  A 20-  French Foley catheter was inserted and left to straight drain.  The patient  was given a B&O suppository and returned to recovery in satisfactory  condition.                                               Boston Service, M.D.    RH/MEDQ  D:  11/09/2002  T:  11/09/2002  Job:  [id number]   cc:   Harl Bowie, M.D.  [address]  [place]  [place] [address]  Fax: [phone]   Gretta Cool, M.D.  [address] [place]  [place] [address]  Fax:  [phone]  

## 2010-08-03 NOTE — Discharge Summary (Signed)
NAME:  Christina [name], Christina [name]                         ACCOUNT NO.:  [id number]   MEDICAL RECORD NO.:  [id number]                   PATIENT TYPE:  IPS   LOCATION:  4148                                 FACILITY:  [facility]   PHYSICIAN:  Erick Colace, M.D.           DATE OF BIRTH:  [birth year]   DATE OF ADMISSION:  07/04/2003  DATE OF DISCHARGE:  07/08/2003                                 DISCHARGE SUMMARY   DISCHARGE DIAGNOSES:  1. Right total hip replacement.  2. Postoperative anemia.  3. Hypertension.  4. Constipation.   HISTORY OF PRESENT ILLNESS:  The patient is a 77 year old female with a  history of hypertension, asthma and end stage osteoarthritis of the right  hip with pain with ambulation. She elected to undergo a right total hip  replacement on April 15 by Dr. Ophelia Charter.  Postoperatively, her pain was  tolerated and improved.  Coumadin was used for deep venous thrombosis  prophylaxis.  Her INR was 2.92 today.  Her postoperative issues included  fevers and a chest x-ray was done showing no active disease.  Therapy was  initiated and the patient had maintained mod assist with transfers, mod  assist with traveling 20 feet times two.   PAST MEDICAL HISTORY:  1. Hypertension.  2. Arthritis.  3. Asthma.  4. Benign essential tremor.  5. Insomnia.  6. Cholecystectomy.  7. Right knee scope.  8. Left knee injury.  9. Bladder cancer with lesion excised.  10.      Gastrointestinal bleeding.  11.      Seasonal allergies.   ALLERGIES:  No known drug allergies.   SOCIAL HISTORY:  The patient lives in a one level home with three to four  steps at entry.  She was independent prior to admission.  She does not use  any alcohol or tobacco.   HOSPITAL COURSE:  The patient was admitted to rehab on July 01, 2003 for  inpatient therapies to consist of PT and OT.  On past admission, the patient  had been placed on Coumadin for deep venous thrombosis prophylaxis.  Her INR  had been stable and  therapeutic.  She was noted to have a moderate amount of  serous drainage from the left hip and dry dressings were continued daily.  Pain control has been variable and the patient was started on OxyContin with  the dose increased to [name].i.d. for better pain relief.   Laboratories done on past admission:  Hemoglobin 8.9, hematocrit 25.8, white  count 5.9, platelets 187,000.  The patient was started on iron supplement  and remains on this for postoperative anemia.  Electrolytes:  Sodium 135.  Potassium 4.1.  Chloride 100.  C02 21. BUN 9.  Creatinine 0.9.  Glucose 112.   The patient's hip incision is noted to be healing well without any signs or  symptoms of infection.  Staples remain intact.  She has been making steady  progress. She  is currently at supervision for bed mobility, supervision with  ambulating 100 feet times two with a rolling walker, min assist for  navigating stairs.  In terms of ADLs, the patient is at sit up assist for  upper body care, min assist for low body care, supervision for toileting.  She prefers to continue therapies at a lower level and a search for a  skilled nursing facility was initiated.  A bed is available in [place] and  the patient is to be discharged to this facility.   DISCHARGE MEDICATIONS:  1. Coumadin (leave blank).  2. __________ 10 mg a day.  3. Advair 250/50 one puff [name].i.d.  4. Rhinocort one squirt [name].i.d.  5. Estrace vaginal cream 0.01% apply vaginally on Monday, Wednesday and     Friday.  6. Plendil 5 mg p.o. daily.  7. Aldactone 25 mg p.o. daily.  8. Multivitamin once a day.  9. Maxzide 37.5/25 mg one alternating every other day.  10.      Nexium 40 mg per day.  11.      OxyContin 10 mg in the a.m. and 20 mg in the p.m.  12.      Keflex 250 mg p.o. q.i.d. times five additional days.  13.      Xanax 0.5 to 1.5 mg p.o. t.i.d. p.r.n. tremors.  14.      Senokot-S two p.o. [name].i.d.  15.      Oxycodone IR 5 to 10 mg p.o. q.4 to 6h p.r.n. pain.   16.      Tylenol 650 mg p.o. q.i.d. p.r.n. pain.   ACTIVITY:  1. Follow right total hip precautions.  2. Weightbearing as tolerated with the use of a walker.   DIET:  Regular.   WOUND CARE:  Cleanse with normal saline daily and apply dressing until  drainage resolves.   SPECIAL INSTRUCTIONS:  1. Staples are to be discontinued on postoperative day 14.  2. Progressive PT and OT to continue past discharge.   FOLLOW UP:  1. The patient is to follow up with Dr. Ophelia Charter in the next couple of weeks     for a postoperative check.  2. Follow up with Dr.  Wynn Banker as needed.  3. Follow up with M.D. for medical issues.      Greg Cutter, P.A.                    Erick Colace, M.D.    PP/MEDQ  D:  07/07/2003  T:  07/07/2003  Job:  [id number]   cc:   Doreen Beam  [address]  [place]  [place] [address]  Fax: [phone]   Donnetta Hail  [address]  [place]  [place] [address]  Fax: [phone]   Veverly Fells. Ophelia Charter, M.D.  [address] [place], [place] [address]  Fax: [phone]

## 2010-08-03 NOTE — Op Note (Signed)
NAME:  Christina [name], Christina [name]                         ACCOUNT NO.:  [id number]   MEDICAL RECORD NO.:  [id number]                   PATIENT TYPE:  INP   LOCATION:  5006                                 FACILITY:  [facility]   PHYSICIAN:  Mark C. Ophelia Charter, M.D.                 DATE OF BIRTH:  October 30, 1925   DATE OF PROCEDURE:  07/01/2003  DATE OF DISCHARGE:                                 OPERATIVE REPORT   PREOPERATIVE DIAGNOSIS:  Right hip osteoarthritis.   POSTOPERATIVE DIAGNOSIS:  Right hip osteoarthritis.   PROCEDURE:  Right total hip arthroplasty.   SURGEON:  Mark C. Ophelia Charter, M.D.   ANESTHESIA:  GOT, Marcaine skin local.   ESTIMATED BLOOD LOSS:  200 mL.   COMPONENTS USED:  Accolade #1 size stem, +4 ball, 28 mm head, 46 acetabular  shell, +10 degree liner.   PROCEDURE:  After induction of general anesthesia, orotracheal intubation,  the patient was placed in lateral position with preoperative Ancef  prophylaxis, standard prepping and draping for a total hip arthroplasty.  Sterile skin marker was used and Betadine Vi-Drape x2 to seal the skin over  the buttocks and also the groin.  Posterior approach was made, gluteus  maximus was split in line with the fibers, piriformis was tagged, Charnley  retractor was placed, posterior capsule was opened.  A large Steinmann pin  was placed underneath the gluteus medius perpendicular to the floor into the  pelvis just coming through the inner table.  With the leg positioned  parallel to the opposite down leg, a small drill bit was pounded into the  trochanter and the measurement was made, which was 65 mm.  This was the leg  length between the two parallel pins prior to cutting the neck.  The hip was  dislocated, the neck was cut a fingerbreadth above the lesser trochanter.  Sequential canal reaming was performed.  Initially the 0 broach, followed by  the #1 broach was used.  Due to the patient's extremely size at 4 feet 8-1/2  inches, the canal was well  filled with the #1 size broach.  Next attention  was turned to the acetabulum.  Sequential reaming was performed up to 46 mm.  This allowed reaming down to the base of the fovea.  A 46 was inserted.  It  would not quite sit all the way down, and a 48 trial was inserted, would not  go inside, so a 46 shell was inserted.  All excessive soft tissue had been  cleared from the rim and the line between the ASIS and the sciatic notch had  been drawn on the patient with the preoperative lateral x-ray of the pelvis.  This seemed to align exactly at the corner of the room and give the patient  20 degrees of cup flexion, and some additional slight cup flexion was added.  As the cup was inserted, the anterior and posterior wall  was very spongy in  this patient with known osteoporosis and extremely small pelvis size.  The  cup would not fit securely.  There were times when it almost felt like it  was secure; however, with manual testing and pushing very hard it would then  begin to dislodge.  A choice was between using the larger cup with holes was  considered; however, there was concern that if the larger cup was put in  with the patient's osteoporotic bone, either the anterior or posterior wall  might crack, or due to the sponginess it might just allow acceptance but  still not allow excellent tight fit, with similar problem with the 46.  It  was elected to put a coating of cement since there was excellent size with  the reamer and the socket.  The reamer had been inserted again and touched  up and again was tried, and again it would almost get solid fixation and one  time a broach was used to insert the trial; however, with reducing and  dislocating the hip, the cup then slightly moved.  Pulsatile lavage was  used.  The cement was vacuum-mixed, and multiple sponges were used in the  socket until the socket was completely dry.  A small amount of cement was  inserted.  The cup was inserted, held for 15  minutes with the 10 degree wall  posterolateral, planned in line with the small post.  All excessive cement  was removed and the permanent liner was inserted, and there was excellent  solid fixation with no motion.  The trial was inserted.  There was excellent  stability with a +4 neck, and this restored some of the patient's  preoperative 7-8 mm shortening.  With measurement, the patient had been  lengthened 5-6 mm, which still left her about even or maybe 2 mm short,  which was satisfactory.  She had flexion to 90 degrees in adduction and then  internal rotation to 85 degrees before she would begin to sublux.  The  anterior capsule was not tight.  The quad was not excessively tight.  No  trochanteric impingement and good stability with full extension and external  rotation.  Permanent stem was inserted, permanent +4 ball after irrigation.  Intraoperative x-ray was taken with the trials just to make sure due to the  patient's extremely small size that there was adequate filling of the canal  and position.  The piriformis was repaired back to the gluteus medius  tendon.  The tensor fascia was reapproximated with #1 Tycron suture, 0  Vicryl in the gluteus maximus, fascia, with 2-0 Vicryl in the subcutaneous  tissue, skin staple closure, Marcaine infiltration, postop dressing, and a  knee immobilizer.  Instrument count and needle count was correct.                                               Mark C. Ophelia Charter, M.D.    MCY/MEDQ  D:  07/01/2003  T:  07/04/2003  Job:  [id number]

## 2010-10-22 ENCOUNTER — Encounter: Payer: Self-pay | Admitting: Cardiovascular Disease

## 2010-10-22 ENCOUNTER — Ambulatory Visit (INDEPENDENT_AMBULATORY_CARE_PROVIDER_SITE_OTHER): Payer: Medicare Other | Admitting: Cardiovascular Disease

## 2010-10-22 DIAGNOSIS — E785 Hyperlipidemia, unspecified: Secondary | ICD-10-CM | POA: Insufficient documentation

## 2010-10-22 DIAGNOSIS — I10 Essential (primary) hypertension: Secondary | ICD-10-CM | POA: Insufficient documentation

## 2010-10-22 DIAGNOSIS — Z01810 Encounter for preprocedural cardiovascular examination: Secondary | ICD-10-CM

## 2010-10-22 DIAGNOSIS — R079 Chest pain, unspecified: Secondary | ICD-10-CM

## 2010-10-22 MED ORDER — AMLODIPINE BESYLATE 2.5 MG PO TABS: 2.5000 mg | ORAL_TABLET | Freq: Every day | ORAL | Status: DC

## 2010-10-22 NOTE — Assessment & Plan Note (Signed)
Her blood pressure is elevated. I will go ahead and add amlodipine 2.5 mg once daily. We can likely switch her to a combination Exforge at followup.

## 2010-10-22 NOTE — Patient Instructions (Signed)
Your physician recommends that you schedule a follow-up appointment in: after test  Your physician has requested that you have a lexiscan myoview. For further information please visit [email or web address] Please follow instruction sheet, as given.  Your physician has recommended you make the following change in your medication: START Amlodipine 2.5 mg daily

## 2010-10-22 NOTE — Assessment & Plan Note (Signed)
This is being treated with Crestor 10 mg once daily.

## 2010-10-22 NOTE — Assessment & Plan Note (Signed)
This is a preoperative cardiovascular evaluation before a planned left shoulder surgery. The patient does complain of chest pain which seems to be overall atypical. She has reduced functional capacity do to significant arthritis. Her ECG is abnormal with left bundle branch block. Due to all of that, I recommend further cardiac evaluation with a pharmacologic nuclear stress test. Will also need to control her blood pressure before the surgery.

## 2010-10-22 NOTE — Progress Notes (Signed)
HPI  This is an 77 year old female who is here to establish cardiovascular care and also for preoperative cardiovascular evaluation for a planned left shoulder surgery. She use to followup with Dr.Dhatt. She has history of hypertension as well as hyperlipidemia. She is known to have left bundle branch block. She had previous cardiac evaluation that included a stress test and an echocardiogram but those were more than 10 years ago. She does get occasional episodes of chest pain which is substernal and lasted for a few minutes. It happens at rest. Her functional capacity is limited do to severe arthritis. She had multiple surgeries in her knees do to previous fall. Her blood pressure has been elevated recently. She used to be on [place] which was withdrawn from the market few months ago. She is currently on Diovan 320 mg once daily.  No Known Allergies   No current outpatient prescriptions on file prior to visit.     Past Medical History  Diagnosis Date  . Goiter   . Pulmonary embolism   . Anxiety   . Impacted cerumen   . CKD (chronic kidney disease)   . GERD (gastroesophageal reflux disease)   . Edema   . Depressive disorder   . Osteoporosis   . LBBB (left bundle branch block)   . Carpal tunnel syndrome   . LBBB (left bundle branch block)   . HTN (hypertension)   . HLD (hyperlipidemia)      Past Surgical History  Procedure Date  . Carpal tunnel release     x3 ( 2L & 1R)  . Tubal ligation   . Cholecystectomy   . Total knee arthroplasty     bilateral  . Total hip arthroplasty     right  . Cataract extraction      Family History  Problem Relation Age of Onset  . Heart disease    . Hypertension    . Heart attack    . Stroke       History   Social History  . Marital Status: Widowed    Spouse Name: N/A    Number of Children: 1  . Years of Education: N/A   Occupational History  . retired    Social History Main Topics  . Smoking status: Never Smoker   .  Smokeless tobacco: Not on file  . Alcohol Use: No  . Drug Use: No  . Sexually Active: Not on file   Other Topics Concern  . Not on file   Social History Narrative  . No narrative on file     ROS Constitutional: Negative for fever, chills, diaphoresis, activity change, appetite change and fatigue.  HENT: Negative for hearing loss, nosebleeds, congestion, sore throat, facial swelling, drooling, trouble swallowing, neck pain, voice change, sinus pressure and tinnitus.  Eyes: Negative for photophobia, pain, discharge and visual disturbance.  Respiratory: Negative for apnea, cough and wheezing.  Cardiovascular: Negative for palpitations and leg swelling.  Gastrointestinal: Negative for nausea, vomiting, abdominal pain, diarrhea, constipation, blood in stool and abdominal distention.  Genitourinary: Negative for dysuria, urgency, frequency, hematuria and decreased urine volume.  Musculoskeletal: Negative for myalgias, back pain, joint swelling, arthralgias and gait problem.  Skin: Negative for color change, pallor, rash and wound.  Neurological: Negative for dizziness, tremors, seizures, syncope, speech difficulty, weakness, light-headedness, numbness and headaches.  Psychiatric/Behavioral: Negative for suicidal ideas, hallucinations, behavioral problems and agitation. The patient is not nervous/anxious.     PHYSICAL EXAM   BP 156/86  Pulse 73  Ht  4\' 10"  (1.473 m)  Wt 134 lb (60.782 kg)  BMI 28.01 kg/m2  SpO2 97%  Constitutional: She is oriented to person, place, and time. She appears well-developed and well-nourished. No distress.  HENT: No nasal discharge.  Head: Normocephalic and atraumatic.  Eyes: Pupils are equal, round, and reactive to light. Right eye exhibits no discharge. Left eye exhibits no discharge.  Neck: Normal range of motion. Neck supple. No JVD present. No thyromegaly present.  Cardiovascular: Normal rate, regular rhythm, normal heart sounds and intact distal  pulses. Exam reveals no gallop and no friction rub.  There is a 1/6 systolic ejection murmur at the base of the heart. Pulmonary/Chest: Effort normal and breath sounds normal. No stridor. No respiratory distress. She has no wheezes. She has no rales. She exhibits no tenderness.  Abdominal: Soft. Bowel sounds are normal. She exhibits no distension. There is no tenderness. There is no rebound and no guarding.  Musculoskeletal: Normal range of motion. She exhibits trace edema and no tenderness.  Neurological: She is alert and oriented to person, place, and time. Coordination normal.  Skin: Skin is warm and dry. No rash noted. She is not diaphoretic. No erythema. No pallor.  Psychiatric: She has a normal mood and affect. Her behavior is normal. Judgment and thought content normal.     EKG: Normal sinus rhythm with left bundle branch block.   ASSESSMENT AND PLAN

## 2010-10-25 ENCOUNTER — Encounter: Payer: Self-pay | Admitting: *Deleted

## 2010-10-29 ENCOUNTER — Encounter: Payer: Self-pay | Admitting: Cardiovascular Disease

## 2010-11-05 ENCOUNTER — Ambulatory Visit (HOSPITAL_COMMUNITY): Payer: Medicare Other | Attending: Cardiovascular Disease | Admitting: Radiology

## 2010-11-05 DIAGNOSIS — I4949 Other premature depolarization: Secondary | ICD-10-CM

## 2010-11-05 DIAGNOSIS — R079 Chest pain, unspecified: Secondary | ICD-10-CM | POA: Insufficient documentation

## 2010-11-05 DIAGNOSIS — I447 Left bundle-branch block, unspecified: Secondary | ICD-10-CM

## 2010-11-05 MED ORDER — ADENOSINE (DIAGNOSTIC) 3 MG/ML IV SOLN
0.5600 mg/kg | Freq: Once | INTRAVENOUS | Status: AC
  Administered 2010-11-05: 33.9 mg via INTRAVENOUS

## 2010-11-05 MED ORDER — TECHNETIUM TC 99M TETROFOSMIN IV KIT
33.0000 | PACK | Freq: Once | INTRAVENOUS | Status: AC | PRN
  Administered 2010-11-05: 33 via INTRAVENOUS

## 2010-11-05 MED ORDER — TECHNETIUM TC 99M TETROFOSMIN IV KIT
10.9000 | PACK | Freq: Once | INTRAVENOUS | Status: AC | PRN
  Administered 2010-11-05: 10.9 via INTRAVENOUS

## 2010-11-05 NOTE — Progress Notes (Signed)
[facility] SITE 3 NUCLEAR MED [address] [place] [address] [phone]  Cardiology Nuclear Med Study  Christina [name] is a 77 y.o. female [id number] 02/27/1926   Nuclear Med Background Indication for Stress Test:  Evaluation for Ischemia and Pending Surgical Clearance 11/15/10: Dr. Rennis Chris History:  No previous documented CAD, Echo and Myocardial Perfusion Study Cardiac Risk Factors: Family History - CAD, Hypertension, LBBB and Lipids  Symptoms:  Chest Pain   Nuclear Pre-Procedure Caffeine/Decaff Intake:  None NPO After: 7:00pm   Lungs:  clear IV 0.9% NS with Angio Cath:  22g  IV Site: R Hand  IV Started by:  Stanton Kidney, EMT-P  Chest Size (in):  34 Cup Size: B  Height: 4\' 10"  (1.473 m)  Weight:  134 lb (60.782 kg)  BMI:  Body mass index is 28.01 kg/(m^2). Tech Comments:  NA    Nuclear Med Study 1 or 2 day study: 1 day  Stress Test Type:  Adenosine  Reading MD: Kristeen Miss, MD  Order Authorizing Provider:  M.Arida  Resting Radionuclide: Technetium 95m Tetrofosmin  Resting Radionuclide Dose: 10.9 mCi   Stress Radionuclide:  Technetium 13m Tetrofosmin  Stress Radionuclide Dose: 33.0 mCi           Stress Protocol Rest HR: 62 Stress HR: 85  Rest BP: 152/73 Stress BP: 172/78  Exercise Time (min): n/a METS: n/a   Predicted Max HR: 138 bpm % Max HR: 61.59 bpm Rate Pressure Product: 57846   Dose of Adenosine (mg):  34.1 Dose of Lexiscan: n/a mg  Dose of Atropine (mg): n/a Dose of Dobutamine: n/a mcg/kg/min (at max HR)  Stress Test Technologist: Milana Na, EMT-P  Nuclear Technologist:  Harlow Asa, CNMT     Rest Procedure:  Myocardial perfusion imaging was performed at rest 45 minutes following the intravenous administration of Technetium 62m Tetrofosmin. Rest ECG: NSR-LBBB  Stress Procedure:  The patient received IV adenosine at 140 mcg/kg/min for 4 minutes.  There were no significant changes, flushed, chest pressure, nausea, and  occ pvcs with infusion.  Technetium 73m Tetrofosmin was injected at the 2 minute mark and quantitative spect images were obtained after a 45 minute delay. Stress ECG: No significant change from baseline ECG  QPS Raw Data Images:  Normal; no motion artifact; normal heart/lung ratio. Stress Images:  Normal homogeneous uptake in all areas of the myocardium. Rest Images:  Normal homogeneous uptake in all areas of the myocardium. Subtraction (SDS):  Normal Transient Ischemic Dilatation (Normal <1.22):  1.02 Lung/Heart Ratio (Normal <0.45):  0.28  Quantitative Gated Spect Images QGS EDV:  66 ml QGS ESV:  21 ml QGS cine images:  NL LV Function; NL Wall Motion QGS EF: 69%  Impression Exercise Capacity:  Adenosine study with no exercise. BP Response:  Normal blood pressure response. Clinical Symptoms:  No chest pain. ECG Impression:  No significant ST segment change suggestive of ischemia. Comparison with Prior Nuclear Study: No images to compare  Overall Impression:  Normal stress nuclear study.  No evidence of ischemia.  Normal LV function.    Vesta Mixer, Montez Hageman., MD, [facility]

## 2010-11-07 ENCOUNTER — Other Ambulatory Visit (HOSPITAL_COMMUNITY): Payer: Medicare Other

## 2010-11-12 ENCOUNTER — Ambulatory Visit (INDEPENDENT_AMBULATORY_CARE_PROVIDER_SITE_OTHER): Payer: Medicare Other | Admitting: Cardiovascular Disease

## 2010-11-12 ENCOUNTER — Encounter: Payer: Self-pay | Admitting: Cardiovascular Disease

## 2010-11-12 DIAGNOSIS — Z01810 Encounter for preprocedural cardiovascular examination: Secondary | ICD-10-CM

## 2010-11-12 DIAGNOSIS — I10 Essential (primary) hypertension: Secondary | ICD-10-CM

## 2010-11-12 NOTE — Assessment & Plan Note (Signed)
The patient actually is not having the surgery anymore. But if she changes her mind she would be considered at low to moderate risk for cardiac complication. Her stress test showed no evidence of ischemia with normal ejection fraction. She does have a chronic left bundle branch block. She's not having chest pain anymore.

## 2010-11-12 NOTE — Progress Notes (Signed)
HPI  This is an 77 year old female who is here today for followup visit. She was seen recently for preoperative cardiovascular evaluation for his shoulder surgery. She complained of atypical chest pain and was found to have left bundle branch block which is not new. Due to that, I referred her for an adenosine nuclear stress test which was performed and showed no evidence of ischemia with normal ejection fraction. Her blood pressure was elevated during her visit and I started her on amlodipine 2.5 mg once daily. The patient has not had any further chest pain. She decided not to have the surgery as her shoulder is feeling better.  No Known Allergies   Current Outpatient Prescriptions on File Prior to Visit  Medication Sig Dispense Refill  . ALPRAZolam (XANAX) 0.5 MG tablet Take 0.5 mg by mouth daily.        Marland Kitchen amLODipine (NORVASC) 2.5 MG tablet Take 1 tablet (2.5 mg total) by mouth daily.  30 tablet  11  . Ascorbic Acid (VITAMIN C) 1000 MG tablet Take 1,000 mg by mouth daily.        . Cholecalciferol (VITAMIN D3) 1000 UNITS tablet Take 1,000 Units by mouth daily.        . fish oil-omega-3 fatty acids 1000 MG capsule Take 1 g by mouth daily.        Marland Kitchen omeprazole (PRILOSEC) 20 MG capsule Take 20 mg by mouth daily.        . Pyridoxine HCl (VITAMIN B-6) 250 MG tablet Take 250 mg by mouth daily.        . rosuvastatin (CRESTOR) 10 MG tablet Take 10 mg by mouth daily.        . valsartan (DIOVAN) 320 MG tablet Take 320 mg by mouth daily.           Past Medical History  Diagnosis Date  . Goiter   . Pulmonary embolism   . Anxiety   . Impacted cerumen   . CKD (chronic kidney disease)   . GERD (gastroesophageal reflux disease)   . Edema   . Depressive disorder   . Osteoporosis   . LBBB (left bundle branch block)   . Carpal tunnel syndrome   . LBBB (left bundle branch block)   . HTN (hypertension)   . HLD (hyperlipidemia)      Past Surgical History  Procedure Date  . Carpal tunnel release       x3 ( 2L & 1R)  . Tubal ligation   . Cholecystectomy   . Total knee arthroplasty     bilateral  . Total hip arthroplasty     right  . Cataract extraction      Family History  Problem Relation Age of Onset  . Heart disease    . Hypertension    . Heart attack    . Stroke       History   Social History  . Marital Status: Widowed    Spouse Name: N/A    Number of Children: 1  . Years of Education: N/A   Occupational History  . retired    Social History Main Topics  . Smoking status: Never Smoker   . Smokeless tobacco: Not on file  . Alcohol Use: No  . Drug Use: No  . Sexually Active: Not on file   Other Topics Concern  . Not on file   Social History Narrative  . No narrative on file      PHYSICAL EXAM   BP 146/78  Pulse 73  Ht 4\' 10"  (1.473 m)  Wt 134 lb (60.782 kg)  BMI 28.01 kg/m2  SpO2 95%  Constitutional: She is oriented to person, place, and time. She appears well-developed and well-nourished. No distress.  HENT: No nasal discharge.  Head: Normocephalic and atraumatic.  Eyes: Pupils are equal, round, and reactive to light. Right eye exhibits no discharge. Left eye exhibits no discharge.  Neck: Normal range of motion. Neck supple. No JVD present. No thyromegaly present.  Cardiovascular: Normal rate, regular rhythm, normal heart sounds. Exam reveals no gallop and no friction rub.  No murmur heard.  Pulmonary/Chest: Effort normal and breath sounds normal. No stridor. No respiratory distress. She has no wheezes. She has no rales. She exhibits no tenderness.  Abdominal: Soft. Bowel sounds are normal. She exhibits no distension. There is no tenderness. There is no rebound and no guarding.  Musculoskeletal: Normal range of motion. She exhibits no edema and no tenderness.  Neurological: She is alert and oriented to person, place, and time. Coordination normal.  Skin: Skin is warm and dry. No rash noted. She is not diaphoretic. No erythema. No pallor.   Psychiatric: She has a normal mood and affect. Her behavior is normal. Judgment and thought content normal.      ASSESSMENT AND PLAN

## 2010-11-12 NOTE — Assessment & Plan Note (Signed)
Her blood pressure is more controlled than before. Continue current medications. I think the goal for her is to have a systolic blood pressure less than 150 during office visits. Her blood pressurre tends to run higher in the office than at home.

## 2010-11-12 NOTE — Patient Instructions (Signed)
Your physician recommends that you schedule a follow-up appointment in: 6 mths in [place]

## 2010-11-15 ENCOUNTER — Inpatient Hospital Stay (HOSPITAL_COMMUNITY): Admission: RE | Admit: 2010-11-15 | Payer: Medicare Other | Source: Ambulatory Visit | Admitting: Orthopedic Surgery

## 2010-12-17 LAB — BASIC METABOLIC PANEL
BUN: 11
BUN: 17
CO2: 30
CO2: 32
Calcium: 8.6
Calcium: 8.6
Calcium: 9.1
Calcium: 9.3
Creatinine, Ser: 0.76
Creatinine, Ser: 1.01
GFR calc Af Amer: >60
GFR calc Af Amer: >60
GFR calc Af Amer: >60
GFR calc non Af Amer: 53 — ABNORMAL LOW
GFR calc non Af Amer: 54 — ABNORMAL LOW
Glucose, Bld: 131 — ABNORMAL HIGH
Potassium: 4.3
Sodium: 135
Sodium: 139

## 2010-12-17 LAB — CBC
HCT: 32.7 — ABNORMAL LOW
Hemoglobin: 10.3 — ABNORMAL LOW
Hemoglobin: 10.9 — ABNORMAL LOW
MCHC: 33.4
MCHC: 33.7
Platelets: 129 — ABNORMAL LOW
Platelets: 181
RBC: 3.27 — ABNORMAL LOW
RBC: 3.4 — ABNORMAL LOW
RBC: 3.61 — ABNORMAL LOW
RDW: 14.4
RDW: 14.4
RDW: 14.5
WBC: 7

## 2010-12-17 LAB — PROTIME-INR
INR: 1.2
INR: 2 — ABNORMAL HIGH
INR: 2.1 — ABNORMAL HIGH
Prothrombin Time: 21.6 — ABNORMAL HIGH
Prothrombin Time: 24.4 — ABNORMAL HIGH

## 2010-12-17 LAB — GLUCOSE, CAPILLARY: Glucose-Capillary: 126 — ABNORMAL HIGH

## 2010-12-19 LAB — COMPREHENSIVE METABOLIC PANEL
ALT: 14
AST: 24
Albumin: 3.7
CO2: 32
Calcium: 10.3
Chloride: 101
GFR calc Af Amer: 53 — ABNORMAL LOW
GFR calc non Af Amer: 44 — ABNORMAL LOW
Sodium: 144
Total Bilirubin: 0.7

## 2010-12-19 LAB — URINALYSIS, ROUTINE W REFLEX MICROSCOPIC
Glucose, UA: NEGATIVE
Ketones, ur: NEGATIVE
Nitrite: NEGATIVE
Specific Gravity, Urine: 1.009
pH: 6.5

## 2010-12-19 LAB — TYPE AND SCREEN
ABO/RH(D): A POS
Antibody Screen: NEGATIVE

## 2010-12-19 LAB — APTT: aPTT: 28

## 2010-12-19 LAB — PROTIME-INR
INR: 1.1
Prothrombin Time: 14.1

## 2010-12-19 LAB — CBC
Platelets: 179
RBC: 4.68
WBC: 5.8

## 2010-12-19 LAB — ABO/RH: ABO/RH(D): A POS

## 2011-01-16 ENCOUNTER — Other Ambulatory Visit: Payer: Self-pay | Admitting: Gynecology

## 2011-01-16 DIAGNOSIS — Z1231 Encounter for screening mammogram for malignant neoplasm of breast: Secondary | ICD-10-CM

## 2011-02-12 ENCOUNTER — Ambulatory Visit
Admission: RE | Admit: 2011-02-12 | Discharge: 2011-02-12 | Disposition: A | Discharge status: Home / Self Care | Payer: Medicare Other | Source: Ambulatory Visit | Attending: Gynecology | Admitting: Gynecology

## 2011-02-12 DIAGNOSIS — Z1231 Encounter for screening mammogram for malignant neoplasm of breast: Secondary | ICD-10-CM

## 2011-03-27 DIAGNOSIS — I10 Essential (primary) hypertension: Secondary | ICD-10-CM | POA: Diagnosis not present

## 2011-03-27 DIAGNOSIS — Z6827 Body mass index (BMI) 27.0-27.9, adult: Secondary | ICD-10-CM | POA: Diagnosis not present

## 2011-04-04 ENCOUNTER — Encounter (HOSPITAL_COMMUNITY): Payer: Self-pay | Admitting: Pharmacy Technician

## 2011-04-12 ENCOUNTER — Other Ambulatory Visit (HOSPITAL_COMMUNITY): Payer: Medicare Other

## 2011-04-24 ENCOUNTER — Other Ambulatory Visit (HOSPITAL_COMMUNITY): Payer: Medicare Other

## 2011-05-01 ENCOUNTER — Encounter (HOSPITAL_COMMUNITY)
Admission: RE | Admit: 2011-05-01 | Discharge: 2011-05-01 | Disposition: A | Discharge status: Home / Self Care | Payer: Medicare Other | Source: Ambulatory Visit | Attending: Orthopedic Surgery | Admitting: Orthopedic Surgery

## 2011-05-01 ENCOUNTER — Encounter (HOSPITAL_COMMUNITY)
Admission: RE | Admit: 2011-05-01 | Discharge: 2011-05-01 | Disposition: A | Discharge status: Home / Self Care | Payer: Medicare Other | Source: Ambulatory Visit | Attending: Anesthesiology | Admitting: Anesthesiology

## 2011-05-01 ENCOUNTER — Encounter (HOSPITAL_COMMUNITY): Payer: Self-pay

## 2011-05-01 DIAGNOSIS — Z01811 Encounter for preprocedural respiratory examination: Secondary | ICD-10-CM | POA: Diagnosis not present

## 2011-05-01 DIAGNOSIS — I10 Essential (primary) hypertension: Secondary | ICD-10-CM | POA: Diagnosis not present

## 2011-05-01 DIAGNOSIS — K449 Diaphragmatic hernia without obstruction or gangrene: Secondary | ICD-10-CM | POA: Diagnosis not present

## 2011-05-01 HISTORY — DX: Other specified postprocedural states: Z98.890

## 2011-05-01 HISTORY — DX: Nausea with vomiting, unspecified: R11.2

## 2011-05-01 HISTORY — DX: Bronchitis, not specified as acute or chronic: J40

## 2011-05-01 HISTORY — DX: Pneumonia, unspecified organism: J18.9

## 2011-05-01 LAB — CBC
HCT: 41.3 % (ref 36.0–46.0)
Hemoglobin: 14.1 g/dL (ref 12.0–15.0)
MCH: 32.5 pg (ref 26.0–34.0)
MCHC: 34.1 g/dL (ref 30.0–36.0)
MCV: 95.2 fL (ref 78.0–100.0)
Platelets: 156 K/uL (ref 150–400)
RBC: 4.34 MIL/uL (ref 3.87–5.11)
RDW: 14.6 % (ref 11.5–15.5)
WBC: 6.4 K/uL (ref 4.0–10.5)

## 2011-05-01 LAB — BASIC METABOLIC PANEL WITH GFR
BUN: 12 mg/dL (ref 6–23)
CO2: 30 meq/L (ref 19–32)
Calcium: 10.7 mg/dL — ABNORMAL HIGH (ref 8.4–10.5)
Chloride: 102 meq/L (ref 96–112)
Creatinine, Ser: 0.91 mg/dL (ref 0.50–1.10)
GFR calc Af Amer: 66 mL/min — ABNORMAL LOW
GFR calc non Af Amer: 57 mL/min — ABNORMAL LOW
Glucose, Bld: 99 mg/dL (ref 70–99)
Potassium: 3.9 meq/L (ref 3.5–5.1)
Sodium: 140 meq/L (ref 135–145)

## 2011-05-01 LAB — TYPE AND SCREEN
ABO/RH(D): A POS
Antibody Screen: NEGATIVE

## 2011-05-01 MED ORDER — CHLORHEXIDINE GLUCONATE 4 % EX LIQD: 60.0000 mL | Freq: Once | CUTANEOUS | Status: DC

## 2011-05-01 NOTE — Progress Notes (Signed)
req echo from dr Kirke Corin in [place] [phone]  note in epic  From dr Kirke Corin from  11/12/10 ekg 10/29/10

## 2011-05-01 NOTE — Pre-Procedure Instructions (Addendum)
20 Christina [name]  05/01/2011   Your procedure is scheduled on:  2.21.13  Report to [facility] at 800 AM.  Call this number if you have problems the morning of surgery: [phone]   Remember:   Do not eat food:After Midnight.  May have clear liquids: up to 4 Hours before arrival. 400 am  Clear liquids include soda, tea, black coffee, apple or grape juice, broth.  Take these medicines the morning of surgery with A SIP OF WATER: xanax, amlodipine,prilosec,propanolol  STOP omega 3, otc vitamins any aspirin, nsaid, herbal meds ,or blood thinners   Do not wear jewelry, make-up or nail polish.  Do not wear lotions, powders, or perfumes. You may wear deodorant.  Do not shave 48 hours prior to surgery.  Do not bring valuables to the hospital.  Contacts, dentures or bridgework may not be worn into surgery.  Leave suitcase in the car. After surgery it may be brought to your room.  For patients admitted to the hospital, checkout time is 11:00 AM the day of discharge.   Patients discharged the day of surgery will not be allowed to drive home.  Name and phone number of your driver:debbie [phone]  Special Instructions: Incentive Spirometry - Practice and bring it with you on the day of surgery. and CHG Shower Use Special Wash: 1/2 bottle night before surgery and 1/2 bottle morning of surgery.   Please read over the following fact sheets that you were given: Pain Booklet, Coughing and Deep Breathing, Blood Transfusion Information, Total Joint Packet, MRSA Information and Surgical Site Infection Prevention

## 2011-05-02 NOTE — Progress Notes (Signed)
Spoke with Shonna Chock PA about LBBB EKG and cardiac notes. Per MD note 10/22/10 patient's ECHO was over 10 yrs ago. He did order stress test which was done 11/05/10 (on chart) and has not for clearance from 11/12/10 (on chart). Okay to proceed per [facility] Standard.

## 2011-05-03 DIAGNOSIS — S8010XA Contusion of unspecified lower leg, initial encounter: Secondary | ICD-10-CM | POA: Diagnosis not present

## 2011-05-03 DIAGNOSIS — Z6827 Body mass index (BMI) 27.0-27.9, adult: Secondary | ICD-10-CM | POA: Diagnosis not present

## 2011-05-08 MED ORDER — CEFAZOLIN SODIUM 1-5 GM-% IV SOLN
1.0000 g | INTRAVENOUS | Status: AC
  Administered 2011-05-09: 1 g via INTRAVENOUS

## 2011-05-08 MED ORDER — LACTATED RINGERS IV SOLN
INTRAVENOUS | Status: DC
  Administered 2011-05-09: 25 mL/h via INTRAVENOUS

## 2011-05-09 ENCOUNTER — Encounter (HOSPITAL_COMMUNITY): Payer: Self-pay | Admitting: Certified Registered"

## 2011-05-09 ENCOUNTER — Inpatient Hospital Stay (HOSPITAL_COMMUNITY)
Admission: RE | Admit: 2011-05-09 | Discharge: 2011-05-12 | DRG: 484 | Disposition: A | Discharge status: Skilled Nursing Facility | Payer: Medicare Other | Source: Ambulatory Visit | Attending: Orthopedic Surgery | Admitting: Orthopedic Surgery

## 2011-05-09 ENCOUNTER — Encounter (HOSPITAL_COMMUNITY): Payer: Self-pay

## 2011-05-09 ENCOUNTER — Encounter (HOSPITAL_COMMUNITY)
Admission: RE | Disposition: A | Discharge status: Skilled Nursing Facility | Payer: Self-pay | Source: Ambulatory Visit | Attending: Orthopedic Surgery

## 2011-05-09 ENCOUNTER — Ambulatory Visit (HOSPITAL_COMMUNITY): Payer: Medicare Other | Admitting: Certified Registered"

## 2011-05-09 DIAGNOSIS — I447 Left bundle-branch block, unspecified: Secondary | ICD-10-CM | POA: Diagnosis not present

## 2011-05-09 DIAGNOSIS — H6120 Impacted cerumen, unspecified ear: Secondary | ICD-10-CM | POA: Diagnosis not present

## 2011-05-09 DIAGNOSIS — E049 Nontoxic goiter, unspecified: Secondary | ICD-10-CM | POA: Diagnosis not present

## 2011-05-09 DIAGNOSIS — Z96659 Presence of unspecified artificial knee joint: Secondary | ICD-10-CM

## 2011-05-09 DIAGNOSIS — Z79899 Other long term (current) drug therapy: Secondary | ICD-10-CM | POA: Diagnosis not present

## 2011-05-09 DIAGNOSIS — X58XXXA Exposure to other specified factors, initial encounter: Secondary | ICD-10-CM | POA: Diagnosis present

## 2011-05-09 DIAGNOSIS — Z9849 Cataract extraction status, unspecified eye: Secondary | ICD-10-CM | POA: Diagnosis not present

## 2011-05-09 DIAGNOSIS — Z86711 Personal history of pulmonary embolism: Secondary | ICD-10-CM

## 2011-05-09 DIAGNOSIS — Z01812 Encounter for preprocedural laboratory examination: Secondary | ICD-10-CM | POA: Diagnosis not present

## 2011-05-09 DIAGNOSIS — M810 Age-related osteoporosis without current pathological fracture: Secondary | ICD-10-CM | POA: Diagnosis present

## 2011-05-09 DIAGNOSIS — J40 Bronchitis, not specified as acute or chronic: Secondary | ICD-10-CM | POA: Diagnosis not present

## 2011-05-09 DIAGNOSIS — Z471 Aftercare following joint replacement surgery: Secondary | ICD-10-CM | POA: Diagnosis not present

## 2011-05-09 DIAGNOSIS — M19019 Primary osteoarthritis, unspecified shoulder: Secondary | ICD-10-CM | POA: Diagnosis not present

## 2011-05-09 DIAGNOSIS — K219 Gastro-esophageal reflux disease without esophagitis: Secondary | ICD-10-CM | POA: Diagnosis present

## 2011-05-09 DIAGNOSIS — I10 Essential (primary) hypertension: Secondary | ICD-10-CM | POA: Diagnosis present

## 2011-05-09 DIAGNOSIS — S9000XA Contusion of unspecified ankle, initial encounter: Secondary | ICD-10-CM | POA: Diagnosis present

## 2011-05-09 DIAGNOSIS — F341 Dysthymic disorder: Secondary | ICD-10-CM | POA: Diagnosis not present

## 2011-05-09 DIAGNOSIS — E785 Hyperlipidemia, unspecified: Secondary | ICD-10-CM | POA: Diagnosis present

## 2011-05-09 DIAGNOSIS — F329 Major depressive disorder, single episode, unspecified: Secondary | ICD-10-CM | POA: Diagnosis not present

## 2011-05-09 DIAGNOSIS — R609 Edema, unspecified: Secondary | ICD-10-CM | POA: Diagnosis not present

## 2011-05-09 DIAGNOSIS — G8918 Other acute postprocedural pain: Secondary | ICD-10-CM | POA: Diagnosis not present

## 2011-05-09 DIAGNOSIS — I2699 Other pulmonary embolism without acute cor pulmonale: Secondary | ICD-10-CM | POA: Diagnosis not present

## 2011-05-09 DIAGNOSIS — Z96649 Presence of unspecified artificial hip joint: Secondary | ICD-10-CM

## 2011-05-09 DIAGNOSIS — Z96619 Presence of unspecified artificial shoulder joint: Secondary | ICD-10-CM | POA: Diagnosis not present

## 2011-05-09 DIAGNOSIS — Z01811 Encounter for preprocedural respiratory examination: Secondary | ICD-10-CM | POA: Diagnosis not present

## 2011-05-09 DIAGNOSIS — Z01818 Encounter for other preprocedural examination: Secondary | ICD-10-CM | POA: Diagnosis not present

## 2011-05-09 DIAGNOSIS — G5600 Carpal tunnel syndrome, unspecified upper limb: Secondary | ICD-10-CM | POA: Diagnosis not present

## 2011-05-09 DIAGNOSIS — F411 Generalized anxiety disorder: Secondary | ICD-10-CM | POA: Diagnosis not present

## 2011-05-09 DIAGNOSIS — R52 Pain, unspecified: Secondary | ICD-10-CM | POA: Diagnosis not present

## 2011-05-09 HISTORY — PX: TOTAL SHOULDER ARTHROPLASTY: SHX126

## 2011-05-09 SURGERY — ARTHROPLASTY, SHOULDER, TOTAL
Anesthesia: General | Site: Shoulder | Laterality: Left | Wound class: Clean

## 2011-05-09 MED ORDER — AMLODIPINE BESYLATE 2.5 MG PO TABS
2.5000 mg | ORAL_TABLET | Freq: Every day | ORAL | Status: DC
  Administered 2011-05-10 – 2011-05-12 (×3): 2.5 mg via ORAL
  Filled 2011-05-09 (×5): qty 1

## 2011-05-09 MED ORDER — FENTANYL CITRATE 0.05 MG/ML IJ SOLN: 50.0000 ug | INTRAMUSCULAR | Status: DC | PRN

## 2011-05-09 MED ORDER — ALUM & MAG HYDROXIDE-SIMETH 200-200-20 MG/5ML PO SUSP
30.0000 mL | ORAL | Status: DC | PRN
  Filled 2011-05-09: qty 30

## 2011-05-09 MED ORDER — GLYCOPYRROLATE 0.2 MG/ML IJ SOLN
INTRAMUSCULAR | Status: DC | PRN
  Administered 2011-05-09: .5 mg via INTRAVENOUS

## 2011-05-09 MED ORDER — HYDROMORPHONE HCL PF 1 MG/ML IJ SOLN: 0.2500 mg | INTRAMUSCULAR | Status: DC | PRN

## 2011-05-09 MED ORDER — PANTOPRAZOLE SODIUM 40 MG PO TBEC
40.0000 mg | DELAYED_RELEASE_TABLET | Freq: Every day | ORAL | Status: DC
Start: 2011-05-09 — End: 2011-05-12
  Administered 2011-05-10 – 2011-05-11 (×2): 40 mg via ORAL
  Filled 2011-05-09 (×3): qty 1

## 2011-05-09 MED ORDER — NEOSTIGMINE METHYLSULFATE 1 MG/ML IJ SOLN
INTRAMUSCULAR | Status: DC | PRN
  Administered 2011-05-09: 3 mg via INTRAVENOUS

## 2011-05-09 MED ORDER — LACTATED RINGERS IV SOLN
INTRAVENOUS | Status: DC
  Administered 2011-05-09 – 2011-05-10 (×2): via INTRAVENOUS

## 2011-05-09 MED ORDER — PROPRANOLOL HCL 10 MG PO TABS
10.0000 mg | ORAL_TABLET | Freq: Every day | ORAL | Status: DC
  Administered 2011-05-10 – 2011-05-12 (×3): 10 mg via ORAL
  Filled 2011-05-09 (×4): qty 1

## 2011-05-09 MED ORDER — SCOPOLAMINE 1 MG/3DAYS TD PT72: 1.0000 | MEDICATED_PATCH | Freq: Once | TRANSDERMAL | Status: DC

## 2011-05-09 MED ORDER — METHOCARBAMOL 500 MG PO TABS: 500.0000 mg | ORAL_TABLET | Freq: Four times a day (QID) | ORAL | Status: DC | PRN

## 2011-05-09 MED ORDER — CEFAZOLIN SODIUM 1-5 GM-% IV SOLN
1.0000 g | Freq: Four times a day (QID) | INTRAVENOUS | Status: AC
  Administered 2011-05-09 – 2011-05-10 (×3): 1 g via INTRAVENOUS
  Filled 2011-05-09 (×3): qty 50

## 2011-05-09 MED ORDER — MENTHOL 3 MG MT LOZG: 1.0000 | LOZENGE | OROMUCOSAL | Status: DC | PRN

## 2011-05-09 MED ORDER — METHOCARBAMOL 100 MG/ML IJ SOLN
500.0000 mg | Freq: Four times a day (QID) | INTRAVENOUS | Status: DC | PRN
  Filled 2011-05-09: qty 5

## 2011-05-09 MED ORDER — ACETAMINOPHEN 325 MG PO TABS: 650.0000 mg | ORAL_TABLET | Freq: Four times a day (QID) | ORAL | Status: DC | PRN

## 2011-05-09 MED ORDER — MIDAZOLAM HCL 2 MG/2ML IJ SOLN: 1.0000 mg | INTRAMUSCULAR | Status: DC | PRN

## 2011-05-09 MED ORDER — ONDANSETRON HCL 4 MG PO TABS: 4.0000 mg | ORAL_TABLET | Freq: Four times a day (QID) | ORAL | Status: DC | PRN

## 2011-05-09 MED ORDER — ZOLPIDEM TARTRATE 5 MG PO TABS
5.0000 mg | ORAL_TABLET | Freq: Every evening | ORAL | Status: DC | PRN
  Administered 2011-05-12: 5 mg via ORAL
  Filled 2011-05-09: qty 1

## 2011-05-09 MED ORDER — ALPRAZOLAM 0.5 MG PO TABS
0.5000 mg | ORAL_TABLET | Freq: Two times a day (BID) | ORAL | Status: DC
  Administered 2011-05-09 – 2011-05-12 (×6): 0.5 mg via ORAL
  Filled 2011-05-09 (×7): qty 1

## 2011-05-09 MED ORDER — PROPOFOL 10 MG/ML IV EMUL
INTRAVENOUS | Status: DC | PRN
  Administered 2011-05-09: 140 mg via INTRAVENOUS

## 2011-05-09 MED ORDER — ONDANSETRON HCL 4 MG/2ML IJ SOLN
INTRAMUSCULAR | Status: DC | PRN
  Administered 2011-05-09: 4 mg via INTRAVENOUS

## 2011-05-09 MED ORDER — PHENOL 1.4 % MT LIQD
1.0000 | OROMUCOSAL | Status: DC | PRN
  Filled 2011-05-09: qty 177

## 2011-05-09 MED ORDER — METOCLOPRAMIDE HCL 5 MG/ML IJ SOLN
5.0000 mg | Freq: Three times a day (TID) | INTRAMUSCULAR | Status: DC | PRN
  Filled 2011-05-09: qty 2

## 2011-05-09 MED ORDER — SODIUM CHLORIDE 0.9 % IV SOLN
10.0000 mg | INTRAVENOUS | Status: DC | PRN
  Administered 2011-05-09: 40 ug/min via INTRAVENOUS

## 2011-05-09 MED ORDER — DOCUSATE SODIUM 100 MG PO CAPS
100.0000 mg | ORAL_CAPSULE | Freq: Two times a day (BID) | ORAL | Status: DC
  Administered 2011-05-09 – 2011-05-12 (×7): 100 mg via ORAL
  Filled 2011-05-09 (×7): qty 1

## 2011-05-09 MED ORDER — METOCLOPRAMIDE HCL 10 MG PO TABS: 5.0000 mg | ORAL_TABLET | Freq: Three times a day (TID) | ORAL | Status: DC | PRN

## 2011-05-09 MED ORDER — BUPIVACAINE-EPINEPHRINE PF 0.5-1:200000 % IJ SOLN
INTRAMUSCULAR | Status: DC | PRN
  Administered 2011-05-09: 25 mL

## 2011-05-09 MED ORDER — HYDROMORPHONE HCL PF 1 MG/ML IJ SOLN: 0.5000 mg | INTRAMUSCULAR | Status: DC | PRN

## 2011-05-09 MED ORDER — HYDROCODONE-ACETAMINOPHEN 5-325 MG PO TABS
1.0000 | ORAL_TABLET | ORAL | Status: DC | PRN
  Administered 2011-05-09: 1 via ORAL
  Filled 2011-05-09: qty 1

## 2011-05-09 MED ORDER — DEXTROSE 5 % IV SOLN
INTRAVENOUS | Status: DC | PRN
  Administered 2011-05-09: 11:00:00 via INTRAVENOUS

## 2011-05-09 MED ORDER — SCOPOLAMINE 1 MG/3DAYS TD PT72
MEDICATED_PATCH | TRANSDERMAL | Status: DC | PRN
  Administered 2011-05-09: 1 via TRANSDERMAL

## 2011-05-09 MED ORDER — ROSUVASTATIN CALCIUM 10 MG PO TABS
10.0000 mg | ORAL_TABLET | Freq: Every day | ORAL | Status: DC
  Administered 2011-05-09 – 2011-05-11 (×3): 10 mg via ORAL
  Filled 2011-05-09 (×4): qty 1

## 2011-05-09 MED ORDER — MIDAZOLAM HCL 5 MG/5ML IJ SOLN
INTRAMUSCULAR | Status: DC | PRN
  Administered 2011-05-09: 2 mg via INTRAVENOUS

## 2011-05-09 MED ORDER — PHENYLEPHRINE HCL 10 MG/ML IJ SOLN
INTRAMUSCULAR | Status: DC | PRN
  Administered 2011-05-09: 40 ug via INTRAVENOUS
  Administered 2011-05-09: 80 ug via INTRAVENOUS

## 2011-05-09 MED ORDER — CEFAZOLIN SODIUM 1-5 GM-% IV SOLN
INTRAVENOUS | Status: AC
  Filled 2011-05-09: qty 50

## 2011-05-09 MED ORDER — OLMESARTAN MEDOXOMIL 40 MG PO TABS
40.0000 mg | ORAL_TABLET | Freq: Every day | ORAL | Status: DC
Start: 2011-05-09 — End: 2011-05-12
  Administered 2011-05-09 – 2011-05-12 (×4): 40 mg via ORAL
  Filled 2011-05-09 (×4): qty 1

## 2011-05-09 MED ORDER — FENTANYL CITRATE 0.05 MG/ML IJ SOLN
INTRAMUSCULAR | Status: DC | PRN
  Administered 2011-05-09: 100 ug via INTRAVENOUS

## 2011-05-09 MED ORDER — DIPHENHYDRAMINE HCL 12.5 MG/5ML PO ELIX
12.5000 mg | ORAL_SOLUTION | ORAL | Status: DC | PRN
  Filled 2011-05-09: qty 10

## 2011-05-09 MED ORDER — ACETAMINOPHEN 650 MG RE SUPP: 650.0000 mg | Freq: Four times a day (QID) | RECTAL | Status: DC | PRN

## 2011-05-09 MED ORDER — OXYCODONE-ACETAMINOPHEN 5-325 MG PO TABS: 1.0000 | ORAL_TABLET | ORAL | Status: DC | PRN

## 2011-05-09 MED ORDER — LORAZEPAM 2 MG/ML IJ SOLN: 1.0000 mg | Freq: Once | INTRAMUSCULAR | Status: DC | PRN

## 2011-05-09 MED ORDER — SODIUM CHLORIDE 0.9 % IR SOLN
Status: DC | PRN
  Administered 2011-05-09: 1000 mL

## 2011-05-09 MED ORDER — LACTATED RINGERS IV SOLN
INTRAVENOUS | Status: DC
  Administered 2011-05-09: 10:00:00 via INTRAVENOUS

## 2011-05-09 MED ORDER — ROCURONIUM BROMIDE 100 MG/10ML IV SOLN
INTRAVENOUS | Status: DC | PRN
  Administered 2011-05-09: 40 mg via INTRAVENOUS

## 2011-05-09 MED ORDER — KETOROLAC TROMETHAMINE 30 MG/ML IJ SOLN
30.0000 mg | Freq: Four times a day (QID) | INTRAMUSCULAR | Status: AC
  Administered 2011-05-09 – 2011-05-11 (×8): 30 mg via INTRAVENOUS
  Filled 2011-05-09 (×10): qty 1

## 2011-05-09 MED ORDER — PROMETHAZINE HCL 25 MG/ML IJ SOLN: 6.2500 mg | INTRAMUSCULAR | Status: DC | PRN

## 2011-05-09 MED ORDER — KETOROLAC TROMETHAMINE 30 MG/ML IJ SOLN
INTRAMUSCULAR | Status: AC
  Filled 2011-05-09: qty 1

## 2011-05-09 MED ORDER — ONDANSETRON HCL 4 MG/2ML IJ SOLN: 4.0000 mg | Freq: Four times a day (QID) | INTRAMUSCULAR | Status: DC | PRN

## 2011-05-09 SURGICAL SUPPLY — 60 items
BLADE SAW SGTL 83.5X18.5 (BLADE) ×2 IMPLANT
CEMENT BONE DEPUY (Cement) ×1 IMPLANT
CLOSURE STERI STRIP 1/2 X4 (GAUZE/BANDAGES/DRESSINGS) ×1 IMPLANT
CLOTH BEACON ORANGE TIMEOUT ST (SAFETY) ×2 IMPLANT
COVER SURGICAL LIGHT HANDLE (MISCELLANEOUS) ×2 IMPLANT
DRAPE INCISE IOBAN 66X45 STRL (DRAPES) ×2 IMPLANT
DRAPE SURG 17X11 SM STRL (DRAPES) ×2 IMPLANT
DRAPE SURG 17X23 STRL (DRAPES) ×2 IMPLANT
DRAPE U-SHAPE 47X51 STRL (DRAPES) ×2 IMPLANT
DRILL BIT 7/64X5 (BIT) IMPLANT
DRSG MEPILEX BORDER 4X8 (GAUZE/BANDAGES/DRESSINGS) ×2 IMPLANT
DURAPREP 26ML APPLICATOR (WOUND CARE) ×2 IMPLANT
ELECT CAUTERY BLADE 6.4 (BLADE) ×3 IMPLANT
ELECT REM PT RETURN 9FT ADLT (ELECTROSURGICAL) ×2
ELECTRODE REM PT RTRN 9FT ADLT (ELECTROSURGICAL) ×1 IMPLANT
FACESHIELD LNG OPTICON STERILE (SAFETY) ×6 IMPLANT
GLOVE BIO SURGEON STRL SZ7.5 (GLOVE) ×2 IMPLANT
GLOVE BIO SURGEON STRL SZ8 (GLOVE) ×2 IMPLANT
GLOVE BIOGEL PI IND STRL 7.5 (GLOVE) IMPLANT
GLOVE BIOGEL PI INDICATOR 7.5 (GLOVE) ×1
GLOVE EUDERMIC 7 POWDERFREE (GLOVE) ×2 IMPLANT
GLOVE SS BIOGEL STRL SZ 7.5 (GLOVE) ×1 IMPLANT
GLOVE SUPERSENSE BIOGEL SZ 7.5 (GLOVE) ×1
GLOVE SURG SS PI 7.0 STRL IVOR (GLOVE) ×2 IMPLANT
GOWN BRE IMP PREV XXLGXLNG (GOWN DISPOSABLE) ×1 IMPLANT
GOWN STRL NON-REIN LRG LVL3 (GOWN DISPOSABLE) ×2 IMPLANT
GOWN STRL REIN XL XLG (GOWN DISPOSABLE) ×2 IMPLANT
KIT BASIN OR (CUSTOM PROCEDURE TRAY) ×2 IMPLANT
KIT ROOM TURNOVER OR (KITS) ×2 IMPLANT
MANIFOLD NEPTUNE II (INSTRUMENTS) ×2 IMPLANT
NDL HYPO 25GX1X1/2 BEV (NEEDLE) IMPLANT
NDL SUT 6 .5 CRC .975X.05 MAYO (NEEDLE) ×1 IMPLANT
NEEDLE HYPO 25GX1X1/2 BEV (NEEDLE) IMPLANT
NEEDLE MAYO TAPER (NEEDLE) ×2
NS IRRIG 1000ML POUR BTL (IV SOLUTION) ×2 IMPLANT
PACK SHOULDER (CUSTOM PROCEDURE TRAY) ×2 IMPLANT
PAD ARMBOARD 7.5X6 YLW CONV (MISCELLANEOUS) ×4 IMPLANT
PASSER SUT SWANSON 36MM LOOP (INSTRUMENTS) ×1 IMPLANT
PENCIL BUTTON HOLSTER BLD 10FT (ELECTRODE) ×1 IMPLANT
PIN METAGLENE 2.5 (PIN) ×1 IMPLANT
SLING ARM IMMOBILIZER LRG (SOFTGOODS) ×1 IMPLANT
SMARTMIX MINI TOWER (MISCELLANEOUS) ×2
SPONGE LAP 18X18 X RAY DECT (DISPOSABLE) ×2 IMPLANT
SPONGE LAP 4X18 X RAY DECT (DISPOSABLE) ×1 IMPLANT
STRIP CLOSURE SKIN 1/2X4 (GAUZE/BANDAGES/DRESSINGS) ×1 IMPLANT
SUCTION FRAZIER TIP 10 FR DISP (SUCTIONS) ×2 IMPLANT
SUT BONE WAX W31G (SUTURE) IMPLANT
SUT FIBERWIRE #2 38 T-5 BLUE (SUTURE) ×6
SUT MNCRL AB 3-0 PS2 18 (SUTURE) ×2 IMPLANT
SUT VIC AB 1 CT1 27 (SUTURE) ×4
SUT VIC AB 1 CT1 27XBRD ANBCTR (SUTURE) ×3 IMPLANT
SUT VIC AB 2-0 CT1 27 (SUTURE) ×2
SUT VIC AB 2-0 CT1 TAPERPNT 27 (SUTURE) ×2 IMPLANT
SUTURE FIBERWR #2 38 T-5 BLUE (SUTURE) ×2 IMPLANT
SYR 30ML SLIP (SYRINGE) ×1 IMPLANT
SYR CONTROL 10ML LL (SYRINGE) IMPLANT
TOWEL OR 17X24 6PK STRL BLUE (TOWEL DISPOSABLE) ×2 IMPLANT
TOWEL OR 17X26 10 PK STRL BLUE (TOWEL DISPOSABLE) ×2 IMPLANT
TOWER SMARTMIX MINI (MISCELLANEOUS) ×1 IMPLANT
WATER STERILE IRR 1000ML POUR (IV SOLUTION) ×1 IMPLANT

## 2011-05-09 NOTE — Anesthesia Procedure Notes (Signed)
Anesthesia Regional Block:  Interscalene brachial plexus block  Pre-Anesthetic Checklist: ,, timeout performed, Correct Patient, Correct Site, Correct Laterality, Correct Procedure, Correct Position, site marked, Risks and benefits discussed,  Surgical consent,  Pre-op evaluation,  At surgeon's request and post-op pain management  Laterality: Left  Prep: chloraprep       Needles:  Injection technique: Single-shot  Needle Type: Stimulator Needle - 40     Needle Length: 5cm 5 cm Needle Gauge: 22 and 22 G    Additional Needles:  Procedures: nerve stimulator Interscalene brachial plexus block  Nerve Stimulator or Paresthesia:  Response: 0.5 mA,   Additional Responses:   Narrative:  Start time: 05/09/2011 10:00 AM End time: 05/09/2011 10:17 AM Injection made incrementally with aspirations every 5 mL. Anesthesiologist: Dr Gypsy Balsam  Additional Notes: 1000-1017 L ISB POP Chg prep, sterile tech, fbp #22 stim needle w/ stim down to .5ma Multi neg asp Vernia Buff .5% w/ epi 1:200000 Total 25cc No compl Dr Gypsy Balsam

## 2011-05-09 NOTE — Progress Notes (Signed)
Report given to maryann rn as caregiver 

## 2011-05-09 NOTE — H&P (Signed)
Christina [name]    Chief Complaint: OA Left Shoulder  HPI: The patient is a 78 y.o. female with end stage Left shoulder OA  Past Medical History  Diagnosis Date  . Goiter   . Pulmonary embolism 8/ 11  . Anxiety   . Impacted cerumen   . GERD (gastroesophageal reflux disease)   . Edema   . Depressive disorder   . Osteoporosis   . Carpal tunnel syndrome   . HTN (hypertension)   . HLD (hyperlipidemia)   . PONV (postoperative nausea and vomiting)   . Bronchitis     hx  . Bladder polyp     bladder lesions  . Pneumonia     pleursy  . LBBB (left bundle branch block)   . LBBB (left bundle branch block)     Past Surgical History  Procedure Date  . Carpal tunnel release     x2 ( l & 1R)  . Tubal ligation   . Cholecystectomy   . Total knee arthroplasty 05,09    bilateral  . Total hip arthroplasty 05    right  . Cataract extraction     bil  . Appendectomy   . Knee arthroscopy 05    rt    Family History  Problem Relation Age of Onset  . Heart disease    . Hypertension    . Heart attack    . Stroke      Social History:  reports that she has never smoked. She does not have any smokeless tobacco history on file. She reports that she does not drink alcohol or use illicit drugs.  Allergies: No Known Allergies  Medications Prior to Admission  Medication Dose Route Frequency Provider Last Rate Last Dose  . ceFAZolin (ANCEF) 1-5 GM-% IVPB           . ceFAZolin (ANCEF) IVPB 1 g/50 mL premix  1 g Intravenous 60 min Pre-Op Senaida Lange, MD      . fentaNYL (SUBLIMAZE) injection 50-100 mcg  50-100 mcg Intravenous PRN Bedelia Person, MD      . fentaNYL (SUBLIMAZE) injection 50-100 mcg  50-100 mcg Intravenous PRN Bedelia Person, MD      . lactated ringers infusion   Intravenous Continuous Ralene Bathe, PA      . midazolam (VERSED) injection 1-2 mg  1-2 mg Intravenous PRN Bedelia Person, MD      . midazolam (VERSED) injection 1-2 mg  1-2 mg Intravenous PRN Bedelia Person, MD      . scopolamine  (TRANSDERM-SCOP) 1.5 MG 1.5 mg  1 patch Transdermal Once Bedelia Person, MD       Medications Prior to Admission  Medication Sig Dispense Refill  . ALPRAZolam (XANAX) 0.5 MG tablet Take 0.5 mg by mouth 2 (two) times daily.       Marland Kitchen amLODipine (NORVASC) 2.5 MG tablet Take 2.5 mg by mouth daily.      . Ascorbic Acid (VITAMIN C) 1000 MG tablet Take 1,000 mg by mouth daily.        . Cholecalciferol (VITAMIN D3) 1000 UNITS tablet Take 1,000 Units by mouth daily.        . fish oil-omega-3 fatty acids 1000 MG capsule Take 2 g by mouth daily.       Marland Kitchen omeprazole (PRILOSEC) 20 MG capsule Take 20 mg by mouth daily.        . rosuvastatin (CRESTOR) 10 MG tablet Take 10 mg by mouth at bedtime.       Marland Kitchen  valsartan (DIOVAN) 320 MG tablet Take 320 mg by mouth daily.           Physical Exam: Left shoulder examined with pain and crepitance, unchanged from office visit 11/30 12. Left anklle with difuse ecchymosis, dorsi and platar flexors show good strength and motion, no instability, no crepitance  Vitals  Temp:  [97.8 F (36.6 C)] 97.8 F (36.6 C) (02/21 0809) Pulse Rate:  [62] 62  (02/21 0809) Resp:  [16] 16  (02/21 0809) BP: (168)/(74) 168/74 mmHg (02/21 0809) SpO2:  [95 %] 95 % (02/21 0809)  Assessment/Plan  Impression: OA Left Shoulder  Left ankle contusion  Plan of Action: Procedure(s): TOTAL SHOULDER ARTHROPLASTY left  Uyen Eichholz M 05/09/2011, 9:41 AM

## 2011-05-09 NOTE — Transfer of Care (Signed)
Immediate Anesthesia Transfer of Care Note  Patient: Christina [name]  Procedure(s) Performed: Procedure(s) (LRB): TOTAL SHOULDER ARTHROPLASTY (Left)  Patient Location: PACU  Anesthesia Type: GA combined with regional for post-op pain  Level of Consciousness: awake  Airway & Oxygen Therapy: Patient Spontanous Breathing and Patient connected to nasal cannula oxygen  Post-op Assessment: Report given to PACU RN, Post -op Vital signs reviewed and stable and Patient moving all extremities  Post vital signs: Reviewed and stable  Complications: No apparent anesthesia complications

## 2011-05-09 NOTE — Anesthesia Preprocedure Evaluation (Signed)
Anesthesia Evaluation  Patient identified by MRN, date of birth, ID band Patient awake    Reviewed: Allergy & Precautions, H&P , NPO status , Patient's Chart, lab work & pertinent test results  History of Anesthesia Complications (+) PONV  Airway Mallampati: I TM Distance: >3 FB Neck ROM: Full    Dental   Pulmonary    Pulmonary exam normal       Cardiovascular hypertension,     Neuro/Psych PSYCHIATRIC DISORDERS Anxiety    GI/Hepatic GERD-  ,  Endo/Other    Renal/GU      Musculoskeletal   Abdominal Normal abdominal exam  (+)   Peds  Hematology   Anesthesia Other Findings   Reproductive/Obstetrics                           Anesthesia Physical Anesthesia Plan  ASA: III  Anesthesia Plan: General   Post-op Pain Management:    Induction: Intravenous  Airway Management Planned: Oral ETT  Additional Equipment:   Intra-op Plan:   Post-operative Plan: Extubation in OR  Informed Consent: I have reviewed the patients History and Physical, chart, labs and discussed the procedure including the risks, benefits and alternatives for the proposed anesthesia with the patient or authorized representative who has indicated his/her understanding and acceptance.     Plan Discussed with: CRNA and Surgeon  Anesthesia Plan Comments:         Anesthesia Quick Evaluation

## 2011-05-09 NOTE — Preoperative (Signed)
Beta Blockers   Reason not to administer Beta Blockers:Propanolol taken 0630hrs on 21 Feb 13

## 2011-05-09 NOTE — Anesthesia Postprocedure Evaluation (Signed)
  Anesthesia Post-op Note  Patient: Christina [name]  Procedure(s) Performed: Procedure(s) (LRB): TOTAL SHOULDER ARTHROPLASTY (Left)  Patient Location: PACU  Anesthesia Type: GA combined with regional for post-op pain  Level of Consciousness: awake, alert  and oriented  Airway and Oxygen Therapy: Patient Spontanous Breathing and Patient connected to nasal cannula oxygen  Post-op Pain: mild  Post-op Assessment: Post-op Vital signs reviewed, Patient's Cardiovascular Status Stable, Respiratory Function Stable, Patent Airway, No signs of Nausea or vomiting and Pain level controlled  Post-op Vital Signs: Reviewed and stable  Complications: No apparent anesthesia complications

## 2011-05-09 NOTE — Op Note (Signed)
05/09/2011  12:11 PM  PATIENT:   Christina [name]  78 y.o. female  PRE-OPERATIVE DIAGNOSIS:  OA Left Shoulder   POST-OPERATIVE DIAGNOSIS:  same  PROCEDURE:  Left TSA with size 12 stem, 44x18 head, 40 pegged glenoid  SURGEON:  Tatsuo Musial, Vania Rea M.D.  ASSISTANTS: Shuford pac   ANESTHESIA:   GET+ISB  EBL: 300  SPECIMEN:  none  Drains: none   PATIENT DISPOSITION:  PACU - hemodynamically stable.    PLAN OF CARE: Admit to inpatient TSA protocol  Dictation#  [id number] [id number]

## 2011-05-10 DIAGNOSIS — M19019 Primary osteoarthritis, unspecified shoulder: Secondary | ICD-10-CM | POA: Diagnosis present

## 2011-05-10 MED ORDER — HYDROCODONE-ACETAMINOPHEN 5-325 MG PO TABS: 1.0000 | ORAL_TABLET | ORAL | Status: AC | PRN

## 2011-05-10 MED ORDER — ZOLPIDEM TARTRATE 5 MG PO TABS: 5.0000 mg | ORAL_TABLET | Freq: Every evening | ORAL | Status: DC | PRN

## 2011-05-10 MED ORDER — METHOCARBAMOL 500 MG PO TABS: 500.0000 mg | ORAL_TABLET | Freq: Four times a day (QID) | ORAL | Status: AC | PRN

## 2011-05-10 NOTE — Progress Notes (Signed)
Clinical Social Work-CSW met with pt and son at bedside to discuss d/c planning-Pt will d/c to [facility] on Sun-Confirmed with [id number] in shadow chart-report left for weekend SW-Bernd Crom-MSW, [phone]

## 2011-05-10 NOTE — Progress Notes (Signed)
UR COMPLETED  

## 2011-05-10 NOTE — Discharge Instructions (Signed)
   Kevin M. Supple, M.D., F.A.A.O.S. Orthopaedic Surgery Specializing in Arthroscopic and Reconstructive Surgery of the Shoulder and Knee [phone] [address] - [place], [place] [address] - Fax [phone]   POST-OP TOTAL SHOULDER REPLACEMENT/SHOULDER HEMIARTHROPLASTY INSTRUCTIONS  1. Call the office at [phone] to schedule your first post-op appointment 10-14 days from the date of your surgery.  2. Leave the steri-strips in place over your incisions when performing dressing changes and showering. You may remove your dressings and begin showering on day 5 from surgery. You can expect drainage that is bloody or yellow in nature that should gradually decrease from day of surgery. Change your dressing daily until drainage is completely resolved, then you may feel free to go without a bandage. You can also expect significant bruising around your shoulder that will drift down your arm and into your chest wall. This is very normal and should resolve over several days.  3. Wear your sling/immobilizer at all times except to perform the exercises below or to occasionally let your arm dangle by your side to stretch your elbow. You also need to sleep in your sling immobilizer until instructed otherwise.  4. Range of motion to your elbow, wrist, and hand are encouraged 3-5 times daily. Exercise to your hand and fingers helps to reduce swelling you may experience.  5. Utilize ice to the shoulder 3-5 times minimum a day and additionally if you are experiencing pain.  6. Prescriptions for a pain medication and a muscle relaxant are provided for you. It is recommended that if you are experiencing pain that you pain medication alone is not controlling, add the muscle relaxant along with the pain medication which can give additional pain relief. The first 1-2 days is generally the most severe of your pain and then should gradually decrease. As your pain lessens it is recommended that you  decrease your use of the pain medications to an "as needed basis'" only and to always comply with the recommended dosages of the pain medications.  7. Pain medications can produce constipation along with their use. If you experience this, the use of an over the counter stool softener or laxative daily is recommended.   8. For most patients, if insurance allows, home health services to include therapy has been arranged.  9. For additional questions or concerns, please do not hesitate to call the office. If after hours there is an answering service to forward your concerns to the physician on call.  POST-OP EXERCISES  Pendulum Exercises  Perform pendulum exercises while standing and bending at the waist. Support your uninvolved arm on a table or chair and allow your operated arm to hang freely. Make sure to do these exercises passively - not using you shoulder muscles.  Repeat 20 times. Do 3 sessions per day.      

## 2011-05-10 NOTE — Discharge Summary (Signed)
PATIENT ID:      Christina [name]  MRN:     [id number] DOB/AGE:    [birth year] / 78 y.o.     DISCHARGE SUMMARY  ADMISSION DATE:    05/09/2011 DISCHARGE DATE:   05/10/2011   ADMISSION DIAGNOSIS: OA Left Shoulder   (OA Left Shoulder )  DISCHARGE DIAGNOSIS:  OA Left Shoulder     ADDITIONAL DIAGNOSIS: Active Problems:  Shoulder arthritis  Past Medical History  Diagnosis Date  . Goiter   . Pulmonary embolism 8/ 11  . Anxiety   . Impacted cerumen   . GERD (gastroesophageal reflux disease)   . Edema   . Depressive disorder   . Osteoporosis   . Carpal tunnel syndrome   . HTN (hypertension)   . HLD (hyperlipidemia)   . PONV (postoperative nausea and vomiting)   . Bronchitis     hx  . Bladder polyp     bladder lesions  . Pneumonia     pleursy  . LBBB (left bundle branch block)   . LBBB (left bundle branch block)     PROCEDURE: Procedure(s): TOTAL SHOULDER ARTHROPLASTY on 05/09/2011  CONSULTS:     HISTORY:  See H&P in chart  HOSPITAL COURSE:  Christina [name] is a 78 y.o. admitted on 05/09/2011 and found to have a diagnosis of OA Left Shoulder .  After appropriate laboratory studies were obtained  they were taken to the operating room on 05/09/2011 and underwent Procedure(s): TOTAL SHOULDER ARTHROPLASTY.   They were given perioperative antibiotics:  Anti-infectives     Start     Dose/Rate Route Frequency Ordered Stop   05/09/11 1600   ceFAZolin (ANCEF) IVPB 1 g/50 mL premix        1 g 100 mL/hr over 30 Minutes Intravenous Every 6 hours 05/09/11 1252 05/10/11 0526   05/09/11 0813   ceFAZolin (ANCEF) 1-5 GM-% IVPB     Comments: TODD, ROBERT: cabinet override         05/09/11 0813 05/09/11 2014   05/08/11 1445   ceFAZolin (ANCEF) IVPB 1 g/50 mL premix        1 g 100 mL/hr over 30 Minutes Intravenous 60 min pre-op 05/08/11 1431 05/09/11 1031        . Blood products given:none  Patient was doing extremely well POD 1. Pain controlled and no N/V. Plan was for 72 hr admit  and DC to [facility] place on Sunday. The remainder of the hospital course was dedicated to ambulation and strengthening.   The patient was discharged on 1 Day Post-Op in  Good condition.    PATIENT ID:      Christina [name]  MRN:     [id number] DOB/AGE:    1926-02-08 / 78 y.o.     DISCHARGE SUMMARY  ADMISSION DATE:    05/09/2011 DISCHARGE DATE:   05/10/2011   ADMISSION DIAGNOSIS: OA Left Shoulder   (OA Left Shoulder )  DISCHARGE DIAGNOSIS:  OA Left Shoulder     ADDITIONAL DIAGNOSIS: Active Problems:  Shoulder arthritis  Past Medical History  Diagnosis Date  . Goiter   . Pulmonary embolism 8/ 11  . Anxiety   . Impacted cerumen   . GERD (gastroesophageal reflux disease)   . Edema   . Depressive disorder   . Osteoporosis   . Carpal tunnel syndrome   . HTN (hypertension)   . HLD (hyperlipidemia)   . PONV (postoperative nausea and vomiting)   . Bronchitis     hx  .  Bladder polyp     bladder lesions  . Pneumonia     pleursy  . LBBB (left bundle branch block)   . LBBB (left bundle branch block)     PROCEDURE: Procedure(s): TOTAL SHOULDER ARTHROPLASTY on 05/09/2011     HISTORY:  See H&P in chart  HOSPITAL COURSE:  Christina [name] is a 78 y.o. admitted on 05/09/2011 and found to have a diagnosis of OA Left Shoulder .  After appropriate laboratory studies were obtained  they were taken to the operating room on 05/09/2011 and underwent Procedure(s): TOTAL SHOULDER ARTHROPLASTY.   They were given perioperative antibiotics:  Anti-infectives     Start     Dose/Rate Route Frequency Ordered Stop   05/09/11 1600   ceFAZolin (ANCEF) IVPB 1 g/50 mL premix        1 g 100 mL/hr over 30 Minutes Intravenous Every 6 hours 05/09/11 1252 05/10/11 0526   05/09/11 0813   ceFAZolin (ANCEF) 1-5 GM-% IVPB     Comments: TODD, ROBERT: cabinet override         05/09/11 0813 05/09/11 2014   05/08/11 1445   ceFAZolin (ANCEF) IVPB 1 g/50 mL premix        1 g 100 mL/hr over 30 Minutes Intravenous  60 min pre-op 05/08/11 1431 05/09/11 1031        . Blood products given:none  Pt did extremely well POD 1 with excellent pain control and no N/V. Plans are for 72 hr admit and DC to [facility] on Sunday. She was NVI to LUE with minimal drainage. The remainder of the hospital course was dedicated to ambulation and strengthening.   The patient was discharged on 1 Day Post-Op in  Good condition.

## 2011-05-10 NOTE — Op Note (Signed)
Christina [name], Christina [name]               ACCOUNT NO.:  [id number]  MEDICAL RECORD NO.:  [id number]  LOCATION:  5001                         FACILITY:  [facility]  PHYSICIAN:  Vania Rea. Waynetta Metheny, M.D.  DATE OF BIRTH:  [birth year]  DATE OF PROCEDURE:  05/09/2011 DATE OF DISCHARGE:                              OPERATIVE REPORT   PREOPERATIVE DIAGNOSIS:  End-stage left shoulder osteoarthrosis.  POSTOPERATIVE DIAGNOSIS:  End-stage left shoulder osteoarthrosis.  PROCEDURE:  Left total shoulder arthroplasty utilizing a [employer] press-fit size 12 stem with a 44 x 18 head and a cemented pegged 40 glenoid.  SURGEON:  Vania Rea. Kamiya Acord, M.D.  Threasa HeadsFrench Ana A. Shuford, PA-C.  ANESTHESIA:  General endotracheal as well as some interscalene block.  BLOOD LOSS:  Approximately 300 mL.  DRAINS:  None.  HISTORY:  Christina [name] is an 78 year old female, who has had chronic and progressive increasing left shoulder pain with functional limitations related to end-stage glenohumeral arthrosis.  She is brought to the operating room at this time for planned left total shoulder arthroplasty.  Preoperatively, I counseled Christina [name] on treatment options as well as risks versus benefits thereof.  Possible surgical complications were reviewed including potential for bleeding, infection, neurovascular injury, persistent pain, anesthetic complications, failure of the implant, possible need for additional surgery.  She understands and accepts and agrees with our planned procedure.  PROCEDURE IN DETAIL:  After undergoing routine preop evaluation, the patient received prophylactic antibiotics, and an interscalene block was established in the holding area by the Anesthesia Department.  She was placed supine on the op table and underwent smooth induction of a general endotracheal anesthesia.  She was placed into beach-chair position and appropriately padded and protected.  Left shoulder girdle region was then  sterilely prepped and draped in standard fashion.  Time- out was called.  An anterior approach to the left shoulder was made through a 12-cm incision along the deltopectoral interval with skin flaps elevated sharply and electrocautery was used for hemostasis. Dissection carried deeply to identify the cephalic vein, which was retracted laterally with the deltoid and deltopectoral interval was then bluntly developed in the upper centimeter of the pec major tendon was tenotomized to improve visualization.  We then identified the conjoined tendon,  retracted this medially and divided adhesions in the subacromial/subdeltoid bursa and retractors were then placed.  The bicipital groove was then unroofed.  Biceps tendon was tenotomized for later tenodesis.  We then split the rotator cuff along the rotator interval at the base of the coracoid and then divided the subscapularis from its insertion to the lesser tuberosity leaving a cuff of tissue for later repair and tagged the free margin of the subscap with a series of 3 grasping #2 FiberWire sutures.  We then mobilized the subscapularis and retracted it medially.  We then performed a capsular release from the anteroinferior and posteroinferior aspects of the humeral head allowing to deliver the head through the wound and then carefully protecting the rotator cuff superiorly and posteriorly.  We then used extramedullary guide to identify our proposed humeral head resection and this was performed at approximately 30 degrees of retroversion using the oscillating saw and carefully protecting  the rotator cuff.  The head was then removed.  We used a hand reaming to gain access to the humeral medullary canal up to size 12.  We then used a cookie-cutter broach to determine appropriate version and broached up to size 12 implant with excellent fit.  Trials were then removed and placed a metal cap on the cut surface of the proximal humerus and then, we  exposed the glenoid with a Fukuda retractor and pitchfork anteriorly.  There was noted to be severe synovitis and performed a synovectomy as well as circumferential labral removal.  We gained good visualization of the entire periphery of the glenoid, which had diffuse expose of chondral bone.  We placed a central guide pin and then reamed to the size 40, which had the best fit and coverage to subchondral bony-bed.  Used peripheral reamer to remove residual bone at the margins of the glenoid.  We then placed our central peg hole and then using the glenoid guide, we placed the peripheral drill holes.  The trial glenoid showed good fit.  We then irrigated and meticulously cleaned the glenoid.  Cement was then mixed and at the appropriate consistency, we passed cement into the peripheral peg holes and the final glenoid was impacted and held in position to allow cement thoroughly hardened.  Once this was completed, we returned our attention to the proximal humerus where we implanted the stem using a bone graft from the humeral head to bone graft the metaphyseal portion and inserted the size 12 stem, obtaining excellent fixation and overall construct was much to our satisfaction.  We then performed trial reductions and the 44 x 18 head showed the best coverage of the proximal humerus with good soft tissue balance.  We then meticulously cleaned  the [facility] taper and implanted the final 44 x 18 head, impacted this into position, performed final reduction.  The wound was then irrigated.  We then repaired the subscapularis back to the lesser tuberosity through the cuff of soft tissue utilizing the #2 FiberWire and then also closed the lateral 2 cm of the rotator interval with 2 figure-of-eight #2 FiberWire and then performed a biceps tenodesis using the FiberWire as well.  At this point, shoulder was taken through range of motion, showing excellent motion and good stability.  The deltopectoral  interval was then allowed to reapproximate and this was closed with 0 Vicryl, 2-0 Vicryl used for the subcu layer, and intracuticular 3-0 Monocryl for the skin followed by Steri-Strips.  Ralene Bathe, PA-C, was utilized as Product manager throughout this case and essential for assistance with positioning of the extremity, retraction, exposure, implantation of the implants, hemostasis, wound closure, and intraoperative decision making.  A dry dressing was applied over the left shoulder.  Left arm was placed in a sling.  The patient was awakened, extubated, and taken to recovery room in stable condition.     Vania Rea. Sanam Marmo, M.D.     KMS/MEDQ  D:  05/09/2011  T:  05/10/2011  Job:  [id number]

## 2011-05-10 NOTE — Evaluation (Signed)
Occupational Therapy Evaluation Patient Details Name: Christina [name] MRN: [id number] DOB: 1925/08/21 Today's Date: 05/10/2011 Please refer to full evaluation in shadow chart.  Pt goals are listed below. Problem List:  Patient Active Problem List  Diagnoses  . HTN (hypertension)  . HLD (hyperlipidemia)  . Preop cardiovascular exam  . Shoulder arthritis    Past Medical History:  Past Medical History  Diagnosis Date  . Goiter   . Pulmonary embolism 8/ 11  . Anxiety   . Impacted cerumen   . GERD (gastroesophageal reflux disease)   . Edema   . Depressive disorder   . Osteoporosis   . Carpal tunnel syndrome   . HTN (hypertension)   . HLD (hyperlipidemia)   . PONV (postoperative nausea and vomiting)   . Bronchitis     hx  . Bladder polyp     bladder lesions  . Pneumonia     pleursy  . LBBB (left bundle branch block)   . LBBB (left bundle branch block)    Past Surgical History:  Past Surgical History  Procedure Date  . Carpal tunnel release     x2 ( l & 1R)  . Tubal ligation   . Cholecystectomy   . Total knee arthroplasty 05,09    bilateral  . Total hip arthroplasty 05    right  . Cataract extraction     bil  . Appendectomy   . Knee arthroscopy 05    rt    OT Assessment/Plan/Recommendation OT Assessment Clinical Impression Statement: Pt s/p admit for L total shoulder arthroplasty who is having no pain, lives alone and will go to SNF for d/c.  Pt would benefit from one more OT treatment session to review pendulum exercises.  Pt has very little pain and therfore tends to do more ACTIVE movement during pendulums than PASSIVE. OT Recommendation/Assessment: Patient will need skilled OT in the acute care venue OT Problem List: Decreased strength;Decreased range of motion Barriers to Discharge: Decreased caregiver support Barriers to Discharge Comments: Pt to go to SNF OT Therapy Diagnosis : Generalized weakness OT Plan OT Frequency: Other (comment) (One more visit  only) OT Treatment/Interventions: Therapeutic exercise OT Recommendation Follow Up Recommendations: Skilled nursing facility Equipment Recommended: Defer to next venue Individuals Consulted Consulted and Agree with Results and Recommendations: Family member/caregiver Family Member Consulted: daughter OT Goals Acute Rehab OT Goals OT Goal Formulation: With patient/family Time For Goal Achievement: 2 days Arm Goals Additional Arm Goal #1: Pt will demonstrate I with pendulum exercises with LUE focusing on motion being a passive movement not an active movement. Arm Goal: Additional Goal #1 - Progress: Goal set today Additional Arm Goal #2: Daughter will be I with shoulder FF PROM to 90 and ABD to 60. Arm Goal: Additional Goal #2 - Progress: Goal set today  OT Evaluation Precautions/Restrictions  Precautions Precautions: Shoulder Type of Shoulder Precautions: PROM shoulder 90 FF, 60ABD, 30 ER.  Pendulums. Precaution Booklet Issued: Yes (comment) Required Braces or Orthoses: Yes Other Brace/Splint: sling to LUE at all times except during exercises and bathing. Restrictions Weight Bearing Restrictions: Yes LUE Weight Bearing: Non weight bearing Prior Functioning Home Living Lives With: Alone Receives Help From: Family Type of Home: House Home Layout: One level Bathroom Shower/Tub: Walk-in shower;Door Bathroom Toilet: Handicapped height Bathroom Accessibility: Yes How Accessible: Accessible via walker Home Adaptive Equipment: Grab bars around toilet;Grab bars in shower;Hand-held shower hose;Shower chair with back;Straight cane Prior Function Level of Independence: Independent with basic ADLs;Independent with homemaking with ambulation;Independent  with gait;Independent with transfers Able to Take Stairs?: Yes Driving: Yes Vocation: Retired ADL   Vision/Perception    Film/video editor  Exercises Pendulum Exercise: PROM;Standing;Left;10 reps Shoulder Flexion: PROM;10 reps;Seated;Limitations (only to 90 degrees) Shoulder ABduction: PROM;Left;10 reps;Seated;Limitations (only to 60 degrees) Shoulder External Rotation: PROM;Left;10 reps;Seated;Limitations (only to 30 degrees) Elbow Flexion: AROM;Left;10 reps;Seated Elbow Extension: AROM;Left;10 reps;Seated Wrist Flexion: AROM;10 reps;Seated Wrist Extension: AROM;10 reps;Left;Seated Digit Composite Flexion: AROM;10 reps;Seated;Left Composite Extension: AROM;Left;Seated;10 reps End of Session OT - End of Session Activity Tolerance: Patient tolerated treatment well Patient left: in bed;with call bell in reach;with family/visitor present Nurse Communication: Mobility status for ambulation General Behavior During Session: [facility] for tasks performed Cognition: [facility] for tasks performed   Hope Budds [phone] 05/10/2011, 9:49 AM

## 2011-05-11 NOTE — Progress Notes (Signed)
Occupational Therapy Treatment Patient Details Name: Christina [name] MRN: [id number] DOB: 05-Jul-1925 Today's Date: 05/11/2011  OT Assessment/Plan OT Assessment/Plan Comments on Treatment Session: Educated pt /pt's daughter on use of button hook. Pt able to use and is modified Indep with dressing. OT Plan: Discharge plan remains appropriate OT Frequency: Other (comment) Follow Up Recommendations: Skilled nursing facility Equipment Recommended: Defer to next venue OT Goals Acute Rehab OT Goals OT Goal Formulation: With patient/family Time For Goal Achievement: 2 days Arm Goals Additional Arm Goal #1: pt will be mod I with UB dressing using button hook Arm Goal: Additional Goal #1 - Progress: Met Additional Arm Goal #2: Daughter will be I with shoulder FF PROM to 90 and ABD to 60. Arm Goal: Additional Goal #2 - Progress: Met  OT Treatment Precautions/Restrictions  Precautions Precautions: Shoulder Type of Shoulder Precautions: PROM shoulder 90 FF, 60ABD, 30 ER.  Pendulums. Precaution Booklet Issued: Yes (comment) Required Braces or Orthoses: Yes Other Brace/Splint: sling to LUE at all times except during exercises and bathing. Restrictions Weight Bearing Restrictions: Yes LUE Weight Bearing: Non weight bearing   ADL ADL Upper Body Bathing: Performed;Minimal assistance;Other (comment) (cues to not move shoulder) Where Assessed - Upper Body Bathing: Standing at sink (using pendulum motion) Upper Body Dressing: Simulated;Minimal assistance;Other (comment) (using 1 handed technique. Pt has difficulty with buttons) Where Assessed - Upper Body Dressing: Sitting, bed Ambulation Related to ADLs: supervision Mobility    Exercises Shoulder Exercises Pendulum Exercise: PROM;Standing;Left;10 reps Shoulder Flexion: PROM;10 reps;Seated;Limitations (only to 90 degrees) Shoulder ABduction: PROM;Left;10 reps;Seated;Limitations (only to 60 degrees) Shoulder External Rotation: PROM;Left;10  reps;Seated;Limitations (only to 30 degrees) Elbow Flexion: AROM;Left;10 reps;Seated Elbow Extension: AROM;Left;10 reps;Seated Wrist Flexion: AROM;10 reps;Seated Wrist Extension: AROM;10 reps;Left;Seated Digit Composite Flexion: AROM;10 reps;Seated;Left Composite Extension: AROM;Left;Seated;10 reps Neck Flexion: AROM;10 reps Neck Extension: AROM;10 reps Neck Lateral Flexion - Right: AROM;10 reps Neck Lateral Flexion - Left: AROM;10 reps  End of Session OT - End of Session Equipment Utilized During Treatment: Gait belt Activity Tolerance: Patient tolerated treatment well Patient left: in bed;with call bell in reach;with family/visitor present Nurse Communication: Mobility status for ambulation General Behavior During Session: [facility] for tasks performed Cognition: [facility] for tasks performed  [facility]  05/11/2011, 6:24 PM [facility], OTR/L  [phone] 05/11/2011

## 2011-05-11 NOTE — Progress Notes (Signed)
Occupational Therapy Treatment Patient Details Name: CINTHIA RODDEN MRN: [id number] DOB: 07-23-1925 Today's Date: 05/11/2011  OT Assessment/Plan OT Assessment/Plan Comments on Treatment Session: Pt is making good progress. Cues to not use shoulder actively. Written HEP given to pt/ daughter for use at SNF. OT Plan: Discharge plan remains appropriate OT Frequency: Other (comment) (One more visit only) Follow Up Recommendations: Skilled nursing facility Equipment Recommended: Defer to next venue OT Goals Acute Rehab OT Goals OT Goal Formulation: With patient/family Time For Goal Achievement: 2 days Arm Goals Additional Arm Goal #1: Pt will demonstrate I with pendulum exercises with LUE focusing on motion being a passive movement not an active movement. Arm Goal: Additional Goal #1 - Progress: Met Additional Arm Goal #2: Daughter will be I with shoulder FF PROM to 90 and ABD to 60. Arm Goal: Additional Goal #2 - Progress: Met  OT Treatment Precautions/Restrictions  Precautions Precautions: Shoulder Type of Shoulder Precautions: PROM shoulder 90 FF, 60ABD, 30 ER.  Pendulums. Precaution Booklet Issued: Yes (comment) Required Braces or Orthoses: Yes Other Brace/Splint: sling to LUE at all times except during exercises and bathing. Restrictions Weight Bearing Restrictions: Yes LUE Weight Bearing: Non weight bearing   ADL ADL Upper Body Bathing: Performed;Minimal assistance;Other (comment) (cues to not move shoulder) Where Assessed - Upper Body Bathing: Standing at sink (using pendulum motion) Upper Body Dressing: Simulated;Minimal assistance;Other (comment) (using 1 handed technique. Pt has difficulty with buttons) Where Assessed - Upper Body Dressing: Sitting, bed Ambulation Related to ADLs: supervision Mobility    Exercises Shoulder Exercises Pendulum Exercise: PROM;Standing;Left;10 reps Shoulder Flexion: PROM;10 reps;Seated;Limitations (only to 90 degrees) Shoulder  ABduction: PROM;Left;10 reps;Seated;Limitations (only to 60 degrees) Shoulder External Rotation: PROM;Left;10 reps;Seated;Limitations (only to 30 degrees) Elbow Flexion: AROM;Left;10 reps;Seated Elbow Extension: AROM;Left;10 reps;Seated Wrist Flexion: AROM;10 reps;Seated Wrist Extension: AROM;10 reps;Left;Seated Digit Composite Flexion: AROM;10 reps;Seated;Left Composite Extension: AROM;Left;Seated;10 reps Neck Flexion: AROM;10 reps Neck Extension: AROM;10 reps Neck Lateral Flexion - Right: AROM;10 reps Neck Lateral Flexion - Left: AROM;10 reps  End of Session OT - End of Session Equipment Utilized During Treatment: Gait belt Activity Tolerance: Patient tolerated treatment well Patient left: in bed;with call bell in reach;with family/visitor present Nurse Communication: Mobility status for ambulation General Behavior During Session: [facility] for tasks performed Cognition: [facility] for tasks performed  Stehanie Ekstrom,HILLARY  05/11/2011, 6:18 PM [facility], OTR/L  [phone] 05/11/2011

## 2011-05-12 DIAGNOSIS — E049 Nontoxic goiter, unspecified: Secondary | ICD-10-CM | POA: Diagnosis not present

## 2011-05-12 DIAGNOSIS — K449 Diaphragmatic hernia without obstruction or gangrene: Secondary | ICD-10-CM | POA: Diagnosis present

## 2011-05-12 DIAGNOSIS — Z86711 Personal history of pulmonary embolism: Secondary | ICD-10-CM | POA: Diagnosis not present

## 2011-05-12 DIAGNOSIS — Z96659 Presence of unspecified artificial knee joint: Secondary | ICD-10-CM | POA: Diagnosis not present

## 2011-05-12 DIAGNOSIS — D649 Anemia, unspecified: Secondary | ICD-10-CM | POA: Diagnosis not present

## 2011-05-12 DIAGNOSIS — G5600 Carpal tunnel syndrome, unspecified upper limb: Secondary | ICD-10-CM | POA: Diagnosis not present

## 2011-05-12 DIAGNOSIS — R52 Pain, unspecified: Secondary | ICD-10-CM | POA: Diagnosis not present

## 2011-05-12 DIAGNOSIS — E870 Hyperosmolality and hypernatremia: Secondary | ICD-10-CM | POA: Diagnosis present

## 2011-05-12 DIAGNOSIS — F329 Major depressive disorder, single episode, unspecified: Secondary | ICD-10-CM | POA: Diagnosis present

## 2011-05-12 DIAGNOSIS — I214 Non-ST elevation (NSTEMI) myocardial infarction: Secondary | ICD-10-CM | POA: Diagnosis present

## 2011-05-12 DIAGNOSIS — Z79899 Other long term (current) drug therapy: Secondary | ICD-10-CM | POA: Diagnosis not present

## 2011-05-12 DIAGNOSIS — R748 Abnormal levels of other serum enzymes: Secondary | ICD-10-CM | POA: Diagnosis not present

## 2011-05-12 DIAGNOSIS — I10 Essential (primary) hypertension: Secondary | ICD-10-CM | POA: Diagnosis not present

## 2011-05-12 DIAGNOSIS — D62 Acute posthemorrhagic anemia: Secondary | ICD-10-CM | POA: Diagnosis present

## 2011-05-12 DIAGNOSIS — K253 Acute gastric ulcer without hemorrhage or perforation: Secondary | ICD-10-CM | POA: Diagnosis not present

## 2011-05-12 DIAGNOSIS — M810 Age-related osteoporosis without current pathological fracture: Secondary | ICD-10-CM | POA: Diagnosis present

## 2011-05-12 DIAGNOSIS — H6120 Impacted cerumen, unspecified ear: Secondary | ICD-10-CM | POA: Diagnosis not present

## 2011-05-12 DIAGNOSIS — R609 Edema, unspecified: Secondary | ICD-10-CM | POA: Diagnosis present

## 2011-05-12 DIAGNOSIS — Z96649 Presence of unspecified artificial hip joint: Secondary | ICD-10-CM | POA: Diagnosis not present

## 2011-05-12 DIAGNOSIS — E878 Other disorders of electrolyte and fluid balance, not elsewhere classified: Secondary | ICD-10-CM | POA: Diagnosis present

## 2011-05-12 DIAGNOSIS — G8918 Other acute postprocedural pain: Secondary | ICD-10-CM | POA: Diagnosis not present

## 2011-05-12 DIAGNOSIS — M19019 Primary osteoarthritis, unspecified shoulder: Secondary | ICD-10-CM | POA: Diagnosis not present

## 2011-05-12 DIAGNOSIS — Z96619 Presence of unspecified artificial shoulder joint: Secondary | ICD-10-CM | POA: Diagnosis not present

## 2011-05-12 DIAGNOSIS — K3189 Other diseases of stomach and duodenum: Secondary | ICD-10-CM | POA: Diagnosis not present

## 2011-05-12 DIAGNOSIS — I130 Hypertensive heart and chronic kidney disease with heart failure and stage 1 through stage 4 chronic kidney disease, or unspecified chronic kidney disease: Secondary | ICD-10-CM | POA: Diagnosis not present

## 2011-05-12 DIAGNOSIS — I2699 Other pulmonary embolism without acute cor pulmonale: Secondary | ICD-10-CM | POA: Diagnosis not present

## 2011-05-12 DIAGNOSIS — E876 Hypokalemia: Secondary | ICD-10-CM | POA: Diagnosis not present

## 2011-05-12 DIAGNOSIS — Z471 Aftercare following joint replacement surgery: Secondary | ICD-10-CM | POA: Diagnosis not present

## 2011-05-12 DIAGNOSIS — I2510 Atherosclerotic heart disease of native coronary artery without angina pectoris: Secondary | ICD-10-CM | POA: Diagnosis not present

## 2011-05-12 DIAGNOSIS — K219 Gastro-esophageal reflux disease without esophagitis: Secondary | ICD-10-CM | POA: Diagnosis not present

## 2011-05-12 DIAGNOSIS — K209 Esophagitis, unspecified without bleeding: Secondary | ICD-10-CM | POA: Diagnosis present

## 2011-05-12 DIAGNOSIS — R112 Nausea with vomiting, unspecified: Secondary | ICD-10-CM | POA: Diagnosis not present

## 2011-05-12 DIAGNOSIS — K222 Esophageal obstruction: Secondary | ICD-10-CM | POA: Diagnosis present

## 2011-05-12 DIAGNOSIS — J40 Bronchitis, not specified as acute or chronic: Secondary | ICD-10-CM | POA: Diagnosis not present

## 2011-05-12 DIAGNOSIS — K922 Gastrointestinal hemorrhage, unspecified: Secondary | ICD-10-CM | POA: Diagnosis not present

## 2011-05-12 DIAGNOSIS — K250 Acute gastric ulcer with hemorrhage: Secondary | ICD-10-CM | POA: Diagnosis present

## 2011-05-12 DIAGNOSIS — F411 Generalized anxiety disorder: Secondary | ICD-10-CM | POA: Diagnosis present

## 2011-05-12 DIAGNOSIS — E785 Hyperlipidemia, unspecified: Secondary | ICD-10-CM | POA: Diagnosis not present

## 2011-05-12 DIAGNOSIS — I447 Left bundle-branch block, unspecified: Secondary | ICD-10-CM | POA: Diagnosis present

## 2011-05-12 NOTE — Progress Notes (Signed)
Pt to transfer to [facility] today via family transport. CSW provided SNF ppw to pt's family with instruction to give to SNF. No other CSW needs reported. CSW signing off. Dellie Burns, MSW, [place] [phone] (weekend)

## 2011-05-12 NOTE — Progress Notes (Signed)
Subjective: 3 Days Post-Op Procedure(s) (LRB): TOTAL SHOULDER ARTHROPLASTY (Left) Patient reports pain as mild.   Denies CP or SOB.  Voiding without difficulty. Positive flatus. Objective: Vital signs in last 24 hours: Temp:  [98 F (36.7 C)-98.5 F (36.9 C)] 98.5 F (36.9 C) (02/24 1610) Pulse Rate:  [59-73] 73  (02/24 0614) Resp:  [16-18] 16  (02/24 0614) BP: (90-150)/(44-70) 139/70 mmHg (02/24 0614) SpO2:  [93 %-97 %] 93 % (02/24 0614)  Intake/Output from previous day: 02/23 0701 - 02/24 0700 In: 840 [P.O.:840] Out: -  Intake/Output this shift:    No results found for this basename: HGB:5 in the last 72 hours No results found for this basename: WBC:2,RBC:2,HCT:2,PLT:2 in the last 72 hours No results found for this basename: NA:2,K:2,CL:2,CO2:2,BUN:2,CREATININE:2,GLUCOSE:2,CALCIUM:2 in the last 72 hours No results found for this basename: LABPT:2,INR:2 in the last 72 hours  Neurologically intact Neurovascular intact Sensation intact distally Incision: no drainage Compartment soft  Assessment/Plan: 3 Days Post-Op Procedure(s) (LRB): TOTAL SHOULDER ARTHROPLASTY (Left) Discharge to SNF  Coalton Arch R. 05/12/2011, 8:33 AM

## 2011-05-13 ENCOUNTER — Encounter (HOSPITAL_COMMUNITY): Payer: Self-pay | Admitting: Orthopedic Surgery

## 2011-05-13 DIAGNOSIS — D62 Acute posthemorrhagic anemia: Secondary | ICD-10-CM | POA: Diagnosis not present

## 2011-05-13 DIAGNOSIS — I130 Hypertensive heart and chronic kidney disease with heart failure and stage 1 through stage 4 chronic kidney disease, or unspecified chronic kidney disease: Secondary | ICD-10-CM | POA: Diagnosis not present

## 2011-05-13 DIAGNOSIS — I2510 Atherosclerotic heart disease of native coronary artery without angina pectoris: Secondary | ICD-10-CM | POA: Diagnosis not present

## 2011-05-13 DIAGNOSIS — K219 Gastro-esophageal reflux disease without esophagitis: Secondary | ICD-10-CM | POA: Diagnosis not present

## 2011-05-13 DIAGNOSIS — N039 Chronic nephritic syndrome with unspecified morphologic changes: Secondary | ICD-10-CM | POA: Diagnosis not present

## 2011-05-13 DIAGNOSIS — Z96619 Presence of unspecified artificial shoulder joint: Secondary | ICD-10-CM | POA: Diagnosis not present

## 2011-05-13 DIAGNOSIS — G8918 Other acute postprocedural pain: Secondary | ICD-10-CM | POA: Diagnosis not present

## 2011-05-13 DIAGNOSIS — K922 Gastrointestinal hemorrhage, unspecified: Secondary | ICD-10-CM | POA: Diagnosis not present

## 2011-05-17 DIAGNOSIS — E049 Nontoxic goiter, unspecified: Secondary | ICD-10-CM | POA: Diagnosis not present

## 2011-05-17 DIAGNOSIS — K922 Gastrointestinal hemorrhage, unspecified: Secondary | ICD-10-CM | POA: Diagnosis not present

## 2011-05-17 DIAGNOSIS — R112 Nausea with vomiting, unspecified: Secondary | ICD-10-CM | POA: Diagnosis not present

## 2011-05-17 DIAGNOSIS — K219 Gastro-esophageal reflux disease without esophagitis: Secondary | ICD-10-CM | POA: Diagnosis not present

## 2011-05-17 DIAGNOSIS — G5600 Carpal tunnel syndrome, unspecified upper limb: Secondary | ICD-10-CM | POA: Diagnosis not present

## 2011-05-17 DIAGNOSIS — K253 Acute gastric ulcer without hemorrhage or perforation: Secondary | ICD-10-CM | POA: Diagnosis not present

## 2011-05-17 DIAGNOSIS — H6120 Impacted cerumen, unspecified ear: Secondary | ICD-10-CM | POA: Diagnosis not present

## 2011-05-17 DIAGNOSIS — M19019 Primary osteoarthritis, unspecified shoulder: Secondary | ICD-10-CM | POA: Diagnosis not present

## 2011-05-17 DIAGNOSIS — D62 Acute posthemorrhagic anemia: Secondary | ICD-10-CM | POA: Diagnosis not present

## 2011-05-17 DIAGNOSIS — I2699 Other pulmonary embolism without acute cor pulmonale: Secondary | ICD-10-CM | POA: Diagnosis not present

## 2011-05-17 DIAGNOSIS — Z471 Aftercare following joint replacement surgery: Secondary | ICD-10-CM | POA: Diagnosis not present

## 2011-05-17 DIAGNOSIS — F329 Major depressive disorder, single episode, unspecified: Secondary | ICD-10-CM | POA: Diagnosis not present

## 2011-05-17 DIAGNOSIS — E785 Hyperlipidemia, unspecified: Secondary | ICD-10-CM | POA: Diagnosis not present

## 2011-05-17 DIAGNOSIS — R52 Pain, unspecified: Secondary | ICD-10-CM | POA: Diagnosis not present

## 2011-05-17 DIAGNOSIS — I10 Essential (primary) hypertension: Secondary | ICD-10-CM | POA: Diagnosis not present

## 2011-05-17 DIAGNOSIS — R748 Abnormal levels of other serum enzymes: Secondary | ICD-10-CM | POA: Diagnosis not present

## 2011-05-17 DIAGNOSIS — R609 Edema, unspecified: Secondary | ICD-10-CM | POA: Diagnosis not present

## 2011-05-17 DIAGNOSIS — Z96619 Presence of unspecified artificial shoulder joint: Secondary | ICD-10-CM | POA: Diagnosis not present

## 2011-05-17 DIAGNOSIS — J40 Bronchitis, not specified as acute or chronic: Secondary | ICD-10-CM | POA: Diagnosis not present

## 2011-05-17 DIAGNOSIS — I214 Non-ST elevation (NSTEMI) myocardial infarction: Secondary | ICD-10-CM | POA: Diagnosis not present

## 2011-05-17 DIAGNOSIS — D649 Anemia, unspecified: Secondary | ICD-10-CM | POA: Diagnosis not present

## 2011-05-17 DIAGNOSIS — F411 Generalized anxiety disorder: Secondary | ICD-10-CM | POA: Diagnosis not present

## 2011-05-17 DIAGNOSIS — M810 Age-related osteoporosis without current pathological fracture: Secondary | ICD-10-CM | POA: Diagnosis not present

## 2011-05-22 ENCOUNTER — Inpatient Hospital Stay (HOSPITAL_COMMUNITY)
Admission: AD | Admit: 2011-05-22 | Discharge: 2011-05-25 | DRG: 377 | Disposition: A | Discharge status: Skilled Nursing Facility | Payer: Medicare Other | Source: Other Acute Inpatient Hospital | Attending: Internal Medicine | Admitting: Internal Medicine

## 2011-05-22 ENCOUNTER — Encounter (HOSPITAL_COMMUNITY): Payer: Self-pay | Admitting: General Practice

## 2011-05-22 DIAGNOSIS — Z86711 Personal history of pulmonary embolism: Secondary | ICD-10-CM | POA: Diagnosis not present

## 2011-05-22 DIAGNOSIS — F411 Generalized anxiety disorder: Secondary | ICD-10-CM | POA: Diagnosis present

## 2011-05-22 DIAGNOSIS — I10 Essential (primary) hypertension: Secondary | ICD-10-CM

## 2011-05-22 DIAGNOSIS — Z01810 Encounter for preprocedural cardiovascular examination: Secondary | ICD-10-CM

## 2011-05-22 DIAGNOSIS — E878 Other disorders of electrolyte and fluid balance, not elsewhere classified: Secondary | ICD-10-CM | POA: Diagnosis present

## 2011-05-22 DIAGNOSIS — Z96649 Presence of unspecified artificial hip joint: Secondary | ICD-10-CM

## 2011-05-22 DIAGNOSIS — J40 Bronchitis, not specified as acute or chronic: Secondary | ICD-10-CM | POA: Diagnosis not present

## 2011-05-22 DIAGNOSIS — M19019 Primary osteoarthritis, unspecified shoulder: Secondary | ICD-10-CM

## 2011-05-22 DIAGNOSIS — K922 Gastrointestinal hemorrhage, unspecified: Secondary | ICD-10-CM | POA: Diagnosis not present

## 2011-05-22 DIAGNOSIS — M810 Age-related osteoporosis without current pathological fracture: Secondary | ICD-10-CM | POA: Diagnosis present

## 2011-05-22 DIAGNOSIS — K449 Diaphragmatic hernia without obstruction or gangrene: Secondary | ICD-10-CM | POA: Diagnosis not present

## 2011-05-22 DIAGNOSIS — I447 Left bundle-branch block, unspecified: Secondary | ICD-10-CM | POA: Diagnosis present

## 2011-05-22 DIAGNOSIS — R609 Edema, unspecified: Secondary | ICD-10-CM | POA: Diagnosis present

## 2011-05-22 DIAGNOSIS — K219 Gastro-esophageal reflux disease without esophagitis: Secondary | ICD-10-CM | POA: Diagnosis not present

## 2011-05-22 DIAGNOSIS — E870 Hyperosmolality and hypernatremia: Secondary | ICD-10-CM | POA: Diagnosis present

## 2011-05-22 DIAGNOSIS — E876 Hypokalemia: Secondary | ICD-10-CM | POA: Diagnosis not present

## 2011-05-22 DIAGNOSIS — K284 Chronic or unspecified gastrojejunal ulcer with hemorrhage: Secondary | ICD-10-CM | POA: Diagnosis not present

## 2011-05-22 DIAGNOSIS — Z96659 Presence of unspecified artificial knee joint: Secondary | ICD-10-CM

## 2011-05-22 DIAGNOSIS — E785 Hyperlipidemia, unspecified: Secondary | ICD-10-CM | POA: Diagnosis not present

## 2011-05-22 DIAGNOSIS — F329 Major depressive disorder, single episode, unspecified: Secondary | ICD-10-CM | POA: Diagnosis present

## 2011-05-22 DIAGNOSIS — K253 Acute gastric ulcer without hemorrhage or perforation: Secondary | ICD-10-CM | POA: Diagnosis not present

## 2011-05-22 DIAGNOSIS — K209 Esophagitis, unspecified without bleeding: Secondary | ICD-10-CM | POA: Diagnosis present

## 2011-05-22 DIAGNOSIS — K2100 Gastro-esophageal reflux disease with esophagitis, without bleeding: Secondary | ICD-10-CM | POA: Diagnosis not present

## 2011-05-22 DIAGNOSIS — F3289 Other specified depressive episodes: Secondary | ICD-10-CM | POA: Diagnosis present

## 2011-05-22 DIAGNOSIS — I214 Non-ST elevation (NSTEMI) myocardial infarction: Secondary | ICD-10-CM | POA: Diagnosis present

## 2011-05-22 DIAGNOSIS — K222 Esophageal obstruction: Secondary | ICD-10-CM

## 2011-05-22 DIAGNOSIS — K250 Acute gastric ulcer with hemorrhage: Secondary | ICD-10-CM | POA: Diagnosis present

## 2011-05-22 DIAGNOSIS — I059 Rheumatic mitral valve disease, unspecified: Secondary | ICD-10-CM | POA: Diagnosis not present

## 2011-05-22 DIAGNOSIS — D62 Acute posthemorrhagic anemia: Secondary | ICD-10-CM | POA: Diagnosis present

## 2011-05-22 DIAGNOSIS — G5600 Carpal tunnel syndrome, unspecified upper limb: Secondary | ICD-10-CM | POA: Diagnosis not present

## 2011-05-22 DIAGNOSIS — D649 Anemia, unspecified: Secondary | ICD-10-CM | POA: Diagnosis not present

## 2011-05-22 DIAGNOSIS — Z96619 Presence of unspecified artificial shoulder joint: Secondary | ICD-10-CM | POA: Diagnosis not present

## 2011-05-22 DIAGNOSIS — Z79899 Other long term (current) drug therapy: Secondary | ICD-10-CM

## 2011-05-22 DIAGNOSIS — R112 Nausea with vomiting, unspecified: Secondary | ICD-10-CM | POA: Diagnosis not present

## 2011-05-22 DIAGNOSIS — K3189 Other diseases of stomach and duodenum: Secondary | ICD-10-CM | POA: Diagnosis not present

## 2011-05-22 DIAGNOSIS — R748 Abnormal levels of other serum enzymes: Secondary | ICD-10-CM | POA: Diagnosis not present

## 2011-05-22 DIAGNOSIS — I219 Acute myocardial infarction, unspecified: Secondary | ICD-10-CM | POA: Diagnosis not present

## 2011-05-22 DIAGNOSIS — Z09 Encounter for follow-up examination after completed treatment for conditions other than malignant neoplasm: Secondary | ICD-10-CM | POA: Diagnosis not present

## 2011-05-22 HISTORY — DX: Polyp of colon: K63.5

## 2011-05-23 ENCOUNTER — Other Ambulatory Visit: Payer: Self-pay

## 2011-05-23 ENCOUNTER — Encounter (HOSPITAL_COMMUNITY)
Admission: AD | Disposition: A | Discharge status: Skilled Nursing Facility | Payer: Self-pay | Source: Other Acute Inpatient Hospital | Attending: Internal Medicine

## 2011-05-23 ENCOUNTER — Encounter (HOSPITAL_COMMUNITY): Payer: Self-pay | Admitting: Physician Assistant

## 2011-05-23 ENCOUNTER — Inpatient Hospital Stay (HOSPITAL_COMMUNITY): Payer: Medicare Other

## 2011-05-23 DIAGNOSIS — K253 Acute gastric ulcer without hemorrhage or perforation: Secondary | ICD-10-CM

## 2011-05-23 DIAGNOSIS — K922 Gastrointestinal hemorrhage, unspecified: Secondary | ICD-10-CM | POA: Diagnosis not present

## 2011-05-23 DIAGNOSIS — R112 Nausea with vomiting, unspecified: Secondary | ICD-10-CM | POA: Diagnosis not present

## 2011-05-23 DIAGNOSIS — K222 Esophageal obstruction: Secondary | ICD-10-CM | POA: Diagnosis not present

## 2011-05-23 DIAGNOSIS — D649 Anemia, unspecified: Secondary | ICD-10-CM | POA: Diagnosis present

## 2011-05-23 DIAGNOSIS — I214 Non-ST elevation (NSTEMI) myocardial infarction: Secondary | ICD-10-CM | POA: Diagnosis present

## 2011-05-23 DIAGNOSIS — I447 Left bundle-branch block, unspecified: Secondary | ICD-10-CM | POA: Diagnosis present

## 2011-05-23 DIAGNOSIS — I10 Essential (primary) hypertension: Secondary | ICD-10-CM | POA: Diagnosis not present

## 2011-05-23 DIAGNOSIS — K449 Diaphragmatic hernia without obstruction or gangrene: Secondary | ICD-10-CM | POA: Diagnosis not present

## 2011-05-23 DIAGNOSIS — Z09 Encounter for follow-up examination after completed treatment for conditions other than malignant neoplasm: Secondary | ICD-10-CM | POA: Diagnosis not present

## 2011-05-23 HISTORY — PX: ESOPHAGOGASTRODUODENOSCOPY: SHX5428

## 2011-05-23 LAB — CARDIAC PANEL(CRET KIN+CKTOT+MB+TROPI)
CK, MB: 3.9 ng/mL (ref 0.3–4.0)
CK, MB: 3.6 ng/mL (ref 0.3–4.0)
CK, MB: 3.1 ng/mL (ref 0.3–4.0)
Relative Index: 2.7 — ABNORMAL HIGH (ref 0.0–2.5)
Total CK: 116 U/L (ref 7–177)
Total CK: 113 U/L (ref 7–177)
Troponin I: 2.57 ng/mL (ref <0.30)
Troponin I: 2.6 ng/mL (ref <0.30)

## 2011-05-23 LAB — CBC
HCT: 24.8 % — ABNORMAL LOW (ref 36.0–46.0)
Hemoglobin: 8.4 g/dL — ABNORMAL LOW (ref 12.0–15.0)
MCHC: 33.9 g/dL (ref 30.0–36.0)
MCV: 95.8 fL (ref 78.0–100.0)
Platelets: 160 10*3/uL (ref 150–400)
RBC: 2.59 MIL/uL — ABNORMAL LOW (ref 3.87–5.11)
RBC: 2.57 MIL/uL — ABNORMAL LOW (ref 3.87–5.11)
RDW: 17.6 % — ABNORMAL HIGH (ref 11.5–15.5)
WBC: 5.7 10*3/uL (ref 4.0–10.5)
WBC: 4.8 10*3/uL (ref 4.0–10.5)

## 2011-05-23 LAB — BASIC METABOLIC PANEL
BUN: 14 mg/dL (ref 6–23)
CO2: 27 mEq/L (ref 19–32)
Chloride: 111 mEq/L (ref 96–112)
GFR calc non Af Amer: 65 mL/min — ABNORMAL LOW (ref >90)
Glucose, Bld: 79 mg/dL (ref 70–99)
Potassium: 3.2 mEq/L — ABNORMAL LOW (ref 3.5–5.1)
Sodium: 145 mEq/L (ref 135–145)

## 2011-05-23 SURGERY — EGD (ESOPHAGOGASTRODUODENOSCOPY)
Anesthesia: Moderate Sedation

## 2011-05-23 MED ORDER — SODIUM CHLORIDE 0.9 % IJ SOLN: 10.0000 mL | Freq: Two times a day (BID) | INTRAMUSCULAR | Status: DC

## 2011-05-23 MED ORDER — DOCUSATE SODIUM 100 MG PO CAPS
100.0000 mg | ORAL_CAPSULE | Freq: Two times a day (BID) | ORAL | Status: DC
  Administered 2011-05-23 – 2011-05-25 (×4): 100 mg via ORAL
  Filled 2011-05-23 (×7): qty 1

## 2011-05-23 MED ORDER — PROPRANOLOL HCL 10 MG PO TABS
10.0000 mg | ORAL_TABLET | Freq: Every day | ORAL | Status: DC
Start: 2011-05-23 — End: 2011-05-25
  Administered 2011-05-23 – 2011-05-25 (×3): 10 mg via ORAL
  Filled 2011-05-23 (×3): qty 1

## 2011-05-23 MED ORDER — ACETAMINOPHEN 325 MG PO TABS
650.0000 mg | ORAL_TABLET | Freq: Four times a day (QID) | ORAL | Status: DC | PRN
  Administered 2011-05-24: 650 mg via ORAL
  Filled 2011-05-23: qty 2

## 2011-05-23 MED ORDER — POTASSIUM CHLORIDE CRYS ER 20 MEQ PO TBCR
40.0000 meq | EXTENDED_RELEASE_TABLET | Freq: Once | ORAL | Status: AC
  Administered 2011-05-24: 40 meq via ORAL
  Filled 2011-05-23: qty 2

## 2011-05-23 MED ORDER — ATORVASTATIN CALCIUM 20 MG PO TABS
20.0000 mg | ORAL_TABLET | Freq: Every day | ORAL | Status: DC
  Administered 2011-05-23 – 2011-05-24 (×2): 20 mg via ORAL
  Filled 2011-05-23 (×3): qty 1

## 2011-05-23 MED ORDER — FENTANYL CITRATE 0.05 MG/ML IJ SOLN
INTRAMUSCULAR | Status: AC
Start: 2011-05-23 — End: 2011-05-23
  Filled 2011-05-23: qty 2

## 2011-05-23 MED ORDER — ONDANSETRON HCL 4 MG PO TABS: 4.0000 mg | ORAL_TABLET | Freq: Four times a day (QID) | ORAL | Status: DC | PRN

## 2011-05-23 MED ORDER — SODIUM CHLORIDE 0.9 % IV SOLN
8.0000 mg/h | INTRAVENOUS | Status: DC
  Administered 2011-05-23 – 2011-05-24 (×4): 8 mg/h via INTRAVENOUS
  Filled 2011-05-23 (×10): qty 80

## 2011-05-23 MED ORDER — AMLODIPINE BESYLATE 2.5 MG PO TABS
2.5000 mg | ORAL_TABLET | Freq: Every day | ORAL | Status: DC
  Administered 2011-05-23 – 2011-05-25 (×3): 2.5 mg via ORAL
  Filled 2011-05-23 (×3): qty 1

## 2011-05-23 MED ORDER — ACETAMINOPHEN 650 MG RE SUPP: 650.0000 mg | Freq: Four times a day (QID) | RECTAL | Status: DC | PRN

## 2011-05-23 MED ORDER — BUTAMBEN-TETRACAINE-BENZOCAINE 2-2-14 % EX AERO
INHALATION_SPRAY | CUTANEOUS | Status: DC | PRN
  Administered 2011-05-23: 2 via TOPICAL

## 2011-05-23 MED ORDER — ONDANSETRON HCL 4 MG/2ML IJ SOLN: 4.0000 mg | Freq: Four times a day (QID) | INTRAMUSCULAR | Status: DC | PRN

## 2011-05-23 MED ORDER — SODIUM CHLORIDE 0.9 % IJ SOLN: 3.0000 mL | Freq: Two times a day (BID) | INTRAMUSCULAR | Status: DC

## 2011-05-23 MED ORDER — MIDAZOLAM HCL 10 MG/2ML IJ SOLN
INTRAMUSCULAR | Status: DC | PRN
  Administered 2011-05-23 (×2): 2 mg via INTRAVENOUS

## 2011-05-23 MED ORDER — SODIUM CHLORIDE 0.9 % IV SOLN
INTRAVENOUS | Status: DC
  Administered 2011-05-23 – 2011-05-24 (×3): via INTRAVENOUS

## 2011-05-23 MED ORDER — FENTANYL NICU IV SYRINGE 50 MCG/ML
INJECTION | INTRAMUSCULAR | Status: DC | PRN
  Administered 2011-05-23 (×2): 25 ug via INTRAVENOUS

## 2011-05-23 MED ORDER — SODIUM CHLORIDE 0.9 % IJ SOLN
10.0000 mL | INTRAMUSCULAR | Status: DC | PRN
  Administered 2011-05-23: 10 mL

## 2011-05-23 MED ORDER — SENNA 8.6 MG PO TABS
1.0000 | ORAL_TABLET | Freq: Two times a day (BID) | ORAL | Status: DC
  Administered 2011-05-23 – 2011-05-25 (×4): 8.6 mg via ORAL
  Filled 2011-05-23 (×7): qty 1

## 2011-05-23 MED ORDER — MIDAZOLAM HCL 10 MG/2ML IJ SOLN
INTRAMUSCULAR | Status: AC
  Filled 2011-05-23: qty 2

## 2011-05-23 MED ORDER — OLMESARTAN MEDOXOMIL 20 MG PO TABS
20.0000 mg | ORAL_TABLET | Freq: Every day | ORAL | Status: DC
  Administered 2011-05-23 – 2011-05-25 (×3): 20 mg via ORAL
  Filled 2011-05-23 (×3): qty 1

## 2011-05-23 NOTE — Progress Notes (Signed)
Clinical Social Worker completed psychosocial assessment and FL2, which can be found in the shadow chart. CSW spoke with patient and daughter, Christina [name]. Patient came from [facility] in [place] and plans to return back when medically ready to continue her rehab. CSW will continue to follow to facilitate discharge to facility when stable.   Christina [name] MSW, [employer] [phone]

## 2011-05-23 NOTE — Progress Notes (Signed)
Pt came with two unit of blood transfusing from [facility], Unit numbers, [id number], [id number], pt tolerated well, no signs of reactions, vitals signs stables. Physician aware . Will continue to monitor.

## 2011-05-23 NOTE — Progress Notes (Signed)
Utilization Review Completed.Loring Liskey T3/09/2011   

## 2011-05-23 NOTE — Evaluation (Signed)
Physical Therapy Evaluation Patient Details Name: Christina [name] MRN: [id number] DOB: 15-Feb-1926 Today's Date: 05/23/2011  Problem List:  Patient Active Problem List  Diagnoses  . HTN (hypertension)  . HLD (hyperlipidemia)  . Preop cardiovascular exam  . Shoulder arthritis  . UGIB (upper gastrointestinal bleed)  . Anemia  . LBBB (left bundle branch block)  . NSTEMI (non-ST elevated myocardial infarction)    Past Medical History:  Past Medical History  Diagnosis Date  . Goiter   . Pulmonary embolism 8/ 11  . Anxiety   . Impacted cerumen   . GERD (gastroesophageal reflux disease)   . Edema   . Depressive disorder   . Osteoporosis   . Carpal tunnel syndrome   . HTN (hypertension)   . HLD (hyperlipidemia)   . PONV (postoperative nausea and vomiting)   . Bronchitis     hx  . Bladder polyp     bladder lesions  . Pneumonia     pleursy  . LBBB (left bundle branch block)   . LBBB (left bundle branch block)   . Colon polyps     last colonoscopy when pt was around 79. No polyps reported but she did previously have small polyps  . Hiatal hernia   . Dysphagia     patient has undergone upper endoscopy with esophageal dilatation probably  around the turn of the Century for further back.   Past Surgical History:  Past Surgical History  Procedure Date  . Carpal tunnel release     x2 ( l & 1R)  . Tubal ligation   . Cholecystectomy   . Total knee arthroplasty 05,09    bilateral  . Total hip arthroplasty 05    right  . Cataract extraction     bil  . Appendectomy   . Knee arthroscopy 05    rt  . Total shoulder arthroplasty 05/09/2011    Procedure: TOTAL SHOULDER ARTHROPLASTY;  Surgeon: Senaida Lange, MD;  Location: [facility] OR;  Service: Orthopedics;  Laterality: Left;  Left Shoulder Total Arthroplasty     PT Assessment/Plan/Recommendation PT Assessment Clinical Impression Statement: pt admitted with Upper GIB.  Treatment continues.  Left shoulder continues to need rehab as  well as pt having mild mobility issues.  Pt and I agree that she would benefit by going back to [facility] to continue rehab on the shoulder and mobility PT Recommendation/Assessment: Patient will need skilled PT in the acute care venue PT Problem List: Decreased strength;Decreased mobility;Decreased range of motion Barriers to Discharge: Decreased caregiver support PT Therapy Diagnosis : Generalized weakness;Other (comment) (shoulder rehab) PT Plan PT Frequency: Min 3X/week PT Recommendation Follow Up Recommendations: Skilled nursing facility Equipment Recommended: Defer to next venue PT Goals  Acute Rehab PT Goals PT Goal Formulation: With patient/family Time For Goal Achievement: 7 days Pt will go Supine/Side to Sit: with supervision PT Goal: Supine/Side to Sit - Progress: Goal set today Pt will Transfer Bed to Chair/Chair to Bed: with modified independence PT Transfer Goal: Bed to Chair/Chair to Bed - Progress: Goal set today Pt will Ambulate: 51 - 150 feet;with modified independence;with least restrictive assistive device PT Goal: Ambulate - Progress: Goal set today  PT Evaluation Precautions/Restrictions  Precautions Precautions: Shoulder Other Brace/Splint: sling to LUE at all times except during exercises and bathing. Restrictions Weight Bearing Restrictions: Yes LUE Weight Bearing: Non weight bearing Prior Functioning  Home Living Lives With: Alone Receives Help From: Friend(s) Type of Home: House Home Layout: One level;Able to live on  main level with bedroom/bathroom Home Access: Stairs to enter Entrance Stairs-Rails: Left Entrance Stairs-Number of Steps: 4 Bathroom Shower/Tub: Tub/shower unit;Walk-in shower;Door Bathroom Toilet: Handicapped height Bathroom Accessibility: Yes How Accessible: Accessible via walker Home Adaptive Equipment: Grab bars around toilet;Grab bars in shower;Bedside commode/3-in-1;Walker - rolling;Straight cane;Walker - four wheeled Prior  Function Level of Independence: Independent with transfers;Independent with gait;Independent with basic ADLs;Independent with homemaking with ambulation Able to Take Stairs?: Yes Driving: Yes Vocation: Retired Producer, television/film/video: Awake/alert Overall Cognitive Status: Appears within functional limits for tasks assessed Orientation Level: Oriented X4 Sensation/Coordination Sensation Light Touch: Appears Intact Coordination Gross Motor Movements are Fluid and Coordinated: Yes Fine Motor Movements are Fluid and Coordinated: Yes Extremity Assessment RUE Assessment RUE Assessment: Within Functional Limits LUE Assessment LUE Assessment: Not tested RLE Assessment RLE Assessment: Within Functional Limits LLE Assessment LLE Assessment:  (bil general weakness at >4/5) Mobility (including Balance) Bed Mobility Bed Mobility: Yes Supine to Sit: 4: Min assist;HOB flat Supine to Sit Details (indicate cue type and reason): vc for technique (via R elbow); truncal assist Sitting - Scoot to Edge of Bed: 7: Independent Transfers Transfers: Yes Sit to Stand: 5: Supervision Ambulation/Gait Ambulation/Gait: Yes Ambulation Distance (Feet): 130 Feet Assistive device: None Gait Pattern: Within Functional Limits (very mild right lateral list) Stairs: No  Posture/Postural Control Posture/Postural Control: No significant limitations Exercise    End of Session PT - End of Session Activity Tolerance: Patient tolerated treatment well Patient left: in chair;with call bell in reach;with family/visitor present Nurse Communication: Mobility status for transfers;Mobility status for ambulation General Behavior During Session: [facility] for tasks performed  Annison Birchard, Eliseo Gum 05/23/2011, 11:16 AM  05/23/2011  Reserve Bing, PT [phone] (pager)

## 2011-05-23 NOTE — Consult Note (Signed)
[place] [facility] Consult: 9:49 AM 05/23/2011   Referring Provider: Roselie Awkward Primary Care Physician:  Paulina Fusi, MD, MD Primary Gastroenterologist:  Dr. Charm Barges    Reason for Consultation:    HPI: Christina [name] is a 78 y.o. female.  Patient is status post shoulder replacement surgery by Dr. Rennis Chris February 21. Patient was released to skilled rehabilitation at [facility]. Patient has a history of pulmonary embolus in August 2011. She completed 6 months of Coumadin therapy at which point it was discontinued. Patient reports onset of black loose stools soon after arriving at the rehabilitation center. Apparently other residents were also having diarrheal illnesses and they thought it was a virus that caused her loose stools. Over this last week end, she developed nausea and vomiting of nonbloody, non-coffee-ground containing partially digested food. She received bismuth and Phenergan. These did not significantly improve her symptoms. By yesterday she had had ongoing black diarrheal stools and nausea, vomiting and lots of belching. The nursing home obtained a CBC this measured 6.3 at [facility]. On February 13 hemoglobin measured 14.1. However this was before surgery. Since surgery she reports extensive bruising going from the left shoulder down into the hip on the left side following her shoulder replacement. She also had a large bruise occur on her right lower leg when a 2 L bottle fell on it,  this was a week or so ago.  At [facility], Transfusion of packed cells was ordered. Her IV access was challenging and ultimately she ended up with a central line in the left neck.  Patient states that on Tuesday evening, the night before going to the hospital, she had some substernal chest pressure which resolved. She is not had shortness of breath, sweating, palpitations. Medications at the nursing home include naproxen, 2 tablets daily but she does take omeprazole 20  mg daily. Patient reports she was using Aleve at home for pain management prior to her surgery.  Patient is a GI patient of Dr. Silvana Newness. He last performed colonoscopy when she was in her late 31s at which time she did not have any polyps, though she had had "small polyps" previously. At that point he released her from further colonoscopies because of her age. He is also performed upper endoscopies in the past and dilation of the esophagus for complaint of dysphagia. This was probably at least 9 or 10 years ago. She does not have dysphagia in recent years.  Patient has received 2 units of packed red blood cells, in the follow up hemoglobin was measuring 8.5. She was also started on a Protonix infusion at [facility] which continues here at [facility]. Patient's family requested patient be transferred to [facility]. PT and INR are normal as is her BUN.  Troponin I at [facility] was high at 1.17. But myoglobin and CPK are normal. A chest x-ray at [facility] shows large hiatal hernia and stable cardiomegaly  Currently patient is nausea free.  She denies abdominal pain. She's never had ulcer disease to her knowledge.  Past Medical History  Diagnosis Date  . Goiter   . Pulmonary embolism 8/ 11  . Anxiety   . Impacted cerumen   . GERD (gastroesophageal reflux disease)   . Edema   . Depressive disorder   . Osteoporosis   . Carpal tunnel syndrome   . HTN (hypertension)   . HLD (hyperlipidemia)   . PONV (postoperative nausea and vomiting)   . Bronchitis     hx  . Bladder polyp  bladder lesions  . Pneumonia     pleursy  . LBBB (left bundle branch block)   . LBBB (left bundle branch block)     Past Surgical History  Procedure Date  . Carpal tunnel release     x2 ( l & 1R)  . Tubal ligation   . Cholecystectomy   . Total knee arthroplasty 05,09    bilateral  . Total hip arthroplasty 05    right  . Cataract extraction     bil  . Appendectomy   . Knee arthroscopy 05    rt  .  Total shoulder arthroplasty 05/09/2011    Procedure: TOTAL SHOULDER ARTHROPLASTY;  Surgeon: Senaida Lange, MD;  Location: [facility] OR;  Service: Orthopedics;  Laterality: Left;  Left Shoulder Total Arthroplasty     Prior to Admission medications   Medication Sig Start Date End Date Taking? Authorizing Provider  ALPRAZolam Prudy Feeler) 0.5 MG tablet Take 0.5 mg by mouth 2 (two) times daily.    Yes Historical Provider, MD  amLODipine (NORVASC) 2.5 MG tablet Take 2.5 mg by mouth daily. 10/22/10 10/22/11 Yes Iran Ouch, MD  Ascorbic Acid (VITAMIN C) 1000 MG tablet Take 1,000 mg by mouth daily.     Yes Historical Provider, MD  Cholecalciferol (VITAMIN D3) 1000 UNITS tablet Take 1,000 Units by mouth daily.     Yes Historical Provider, MD  fish oil-omega-3 fatty acids 1000 MG capsule Take 2 g by mouth daily.    Yes Historical Provider, MD  naproxen sodium (ANAPROX) 220 MG tablet Take 220 mg by mouth 2 (two) times daily with a meal.   Yes Historical Provider, MD  omeprazole (PRILOSEC) 20 MG capsule Take 20 mg by mouth daily.     Yes Historical Provider, MD  propranolol (INDERAL) 10 MG tablet Take 10 mg by mouth daily.   Yes Historical Provider, MD  pyridOXINE (VITAMIN B-6) 100 MG tablet Take 100 mg by mouth daily.   Yes Historical Provider, MD  rosuvastatin (CRESTOR) 10 MG tablet Take 10 mg by mouth at bedtime.    Yes Historical Provider, MD  valsartan (DIOVAN) 320 MG tablet Take 320 mg by mouth daily.     Yes Historical Provider, MD    Scheduled Meds:    . amLODipine  2.5 mg Oral Daily  . atorvastatin  20 mg Oral q1800  . docusate sodium  100 mg Oral BID  . olmesartan  20 mg Oral Daily  . propranolol  10 mg Oral Daily  . senna  1 tablet Oral BID  . sodium chloride  3 mL Intravenous Q12H   Infusions:    . sodium chloride 100 mL/hr at 05/23/11 0116  . pantoprozole (PROTONIX) infusion 8 mg/hr (05/23/11 0135)   PRN Meds: acetaminophen, acetaminophen, ondansetron (ZOFRAN) IV,  ondansetron   Allergies as of 05/22/2011  . (No Known Allergies)    Family History  Problem Relation Age of Onset  . Heart disease    . Hypertension    . Heart attack    . Stroke      History   Social History  . Marital Status: Widowed    Spouse Name: N/A    Number of Children: 1  . Years of Education: N/A   Occupational History  . retired    Social History Main Topics  . Smoking status: Never Smoker   . Smokeless tobacco: Not on file  . Alcohol Use: No  . Drug Use: No  . Sexually Active: Yes  Birth Control/ Protection: Post-menopausal   Other Topics Concern  . Not on file   Social History Narrative  . No narrative on file    REVIEW OF SYSTEMS: Constitutional:  Generally feels well but lately complains of weakness. ENT:  No nosebleeds. Does have sinus congestion that started yesterday Pulm:  Nonproductive cough. No shortness of breath CV:  No palpitations. Denies history of coronary disease. She underwent recent nuclear stress test in anticipation of her shoulder surgery.  Has intermittent swelling of the ankles. GU:  No hematuria, no dysuria. GI:  See history of present illness Heme:  Has never had anemia.    Transfusions:  No previous transfusion Neuro:  No headaches, no vision changes.  Has had benign essential tremor for many years Derm:  No rash, sores or itching Endocrine:  No excessive thirst for excessive urination Immunization:  Is up-to-date on her flu shot but has not had pneumococcal vaccine Travel:  None other than within local count disease   PHYSICAL EXAM: Vital signs in last 24 hours: Temp:  [98.5 F (36.9 C)-99.1 F (37.3 C)] 98.5 F (36.9 C) (03/07 0500) Pulse Rate:  [79-84] 79  (03/07 0500) Resp:  [16-18] 18  (03/07 0500) BP: (118-144)/(66-71) 135/71 mmHg (03/07 0500) SpO2:  [97 %-100 %] 97 % (03/07 0500) FiO2 (%):  [28 %] 28 % (03/07 0031) Weight:  [141 lb 6.4 oz (64.139 kg)] 141 lb 6.4 oz (64.139 kg) (03/06 2045)  General:  frail, somewhat pale and tremulous, elderly white female Head:  Atraumatic, normocephalic  Eyes:  Slight conjunctival pallor, EOMI Ears:  No hearing aids in place, hearing not grossly diminished  Nose:  No discharge but does sound congested Mouth:  Teeth in good repair. Pink, moist mucous membranes Neck:  No JVD, goiter or masses Lungs:  Clear to auscultation and percussion bilaterally Heart: irregularly irregular heart rate which is not accelerated. EKG shows sinus rhythm with premature atrial and premature ventricular beats. 2/6 systolic murmur. Abdomen:  Soft, active bowel sounds, no bruits, no hernias. No hepatosplenomegaly, no masses.   Rectal: rectal vault is empty so no stool obtained. No masses. In the emergency room stool was heme-positive   Musc/Skeltl: arthritic deformities noted in the hands. Clean dry intact scar at the left shoulder with left arm in a sling. Extremities:  Slight, nonpitting edema of both feet and ankles. No erythema at the ankles but they are mildly tender to the touch  Neurologic:  Patient has a resting tremor of the trunk, head and upper extremities. She moves all 4 limbs easily obviously left arm motion restricted due to recent surgery Skin:  No rashes, no sores, no telangiectasia Heme:  Residual extensive bruising along the left posterior trunk radiating from the shoulder down to the hip. Tattoos:  None seen Nodes:  None at the neck   Psych:  Pleasant, slightly anxious. Provides good history.  Intake/Output from previous day:   Intake/Output this shift:    LAB RESULTS:  Basename 05/23/11 0130  WBC 5.7  HGB 8.5*  HCT 24.8*  PLT 160   BMET Lab Results  Component Value Date   NA 145 05/23/2011   NA 140 05/01/2011   NA 142 02/28/2009   K 3.2* 05/23/2011   K 3.9 05/01/2011   K 3.2* 02/28/2009   CL 111 05/23/2011   CL 102 05/01/2011   CL 99 12/04/2007   CO2 27 05/23/2011   CO2 30 05/01/2011   CO2 29 12/04/2007   GLUCOSE 79 05/23/2011  GLUCOSE 99  05/01/2011   GLUCOSE 96 02/28/2009   BUN 14 05/23/2011   BUN 12 05/01/2011   BUN 17 12/04/2007   CREATININE 0.81 05/23/2011   CREATININE 0.91 05/01/2011   CREATININE 1.01 12/04/2007   CALCIUM 9.0 05/23/2011   CALCIUM 10.7* 05/01/2011   CALCIUM 9.3 12/04/2007    PT/INR Lab Results  Component Value Date   INR 1.8* 12/04/2007   INR 2.1* 12/03/2007   INR 2.0* 12/02/2007        INR                        1.1                                                                    05/22/2011       PT                          11.6                                                                 05/22/2011       A PTT                     20.8                                                                 05/22/2011  RADIOLOGY STUDIES: Dg Chest Port 1 View  05/23/2011  *RADIOLOGY REPORT*  Clinical Data: Central line placement  PORTABLE CHEST - 1 VIEW  Comparison: Portable exam 0028 hours compared to 05/01/2011  Findings: New left jugular central venous catheter, tip projecting over SVC. Enlargement of cardiac silhouette with calcified tortuous aorta. Large hiatal hernia. Pulmonary vascularity normal. Lungs clear. No pneumothorax. Bones demineralized with note of a left older prosthesis.  IMPRESSION: No pneumothorax following left jugular line placement.  Original Report Authenticated By: Lollie Marrow, M.D.    ENDOSCOPIC STUDIES: Colonoscopy when patient was in her late 63s by Dr. Charm Barges. Previous colonoscopies have shown small polyps of unknown pathology Previous upper endoscopy with dilation of the esophagus at least 9 years ago  IMPRESSION: 1. GI bleed with melenic stool reported along with nausea and vomiting of nonbloody material. She has been taking Naprosyn regularly and I suspect that she has ulcers or upper GI irritation related to this. She also has a large hiatal hernia and may have Cameron erosions. Since she is not thrown up any bloody material I do not suspect Mallory-Weiss tear. 2. Blood loss anemia. GI  losses may not be the only source. She had significant bruising following surgery and did not have a postop CBC, but I suspect within a couple of days after surgery  her H&H had probably dropped from postsurgical losses 3. Chest pain occurring 2 nights ago. Troponin is slightly elevated, EKG shows no acute ischemic changes. Heart rate is irregular but reading the EKG it does not look like atrial fibrillation. 4.  recent left shoulder replacement on 05/09/2011  PLAN: 1. Needs upper endoscopy. We'll try to arrange this for today. She has been n.p.o. Until endoscopy will continue the proton pump inhibitor drip.  She has Q8 hours CBCs ordered   LOS: 1 day   Jennye Moccasin  05/23/2011, 9:49 AM Pager: [phone]  GI ATTENDING  HX, LABS REVIEWED. PATIENT PERSONALLY SEEN AND EXAMINED. AGREE WITH H&P AS ABOVE. SEEMS TO HAVE HAD A TRANSIENT UGI BLEED IN FACE OF NSAIDS. NOT CERTAIN OF POST OP H/H. HAD BISMITH.PREVIOUS COLONS ELSEWHERE, UP TO DATE PLAN URGENT EGD.The nature of the procedure, as well as the risks, benefits, and alternatives were carefully and thoroughly reviewed with the patient. Ample time for discussion and questions allowed. The patient understood, was satisfied, and agreed to proceed.   Wilhemina Bonito. Eda Keys., M.D. [facility] Division of Gastroenterology

## 2011-05-23 NOTE — Consult Note (Signed)
CARDIOLOGY CONSULT NOTE  Patient ID: Christina [name], MRN: [id number], DOB/AGE: [birth year] 78 y.o. Admit date: 05/22/2011 Date of Consult: 05/23/2011  Primary Physician: Paulina Fusi, MD, MD Primary Cardiologist: Dr. Kirke Corin  Chief Complaint: Melena  Reason for Consultation: Elevated troponin in the setting of GI bleed  HPI: 78 y.o. female w/ PMHx significant for LBBB, HTN, HLD, CKD, PE, and a nuclear stress test without evidence of ischemia on 10/2010 who presented to [facility] from [facility] for melena and was found to be anemic with elevated troponins and was subsequently transferred to [facility] on 05/23/11 for further evaluation and treatment.  She was seen by Dr. Kirke Corin in 10/2010 for preop clearance for shoulder surgery at which time she reported a history of chest pain. She had a nuclear stress test at that time which showed no evidence of ischemia and normal LV systolic function. She underwent left shoulder arthroplasty in 04/2011 and was discharged to [facility] facility. Over the past few days has been having dark stools and diarrhea. Thought she had GI virus. Over last 2 days more weak with GI reflux and some CP. CBC drawn and found to be anemic. Sent to [facility]  At [facility] her labs were significant for Hct 18.4 w/ MCV 100, and troponin 1.17. CXR showed stable cardiac enlargement. She was placed on a protonix drip, transfused with 2units PRBCs, and transferred to [facility] on the medicine service.   At [facility] she was evaluated by GI who suspected her anemia was from a combination of GI losses ?ulcers and post op anemia. An EGD was performed on 05/23/11 with findings of a pyloric ulcer likely the cause of GI bleed, but no active bleeding. Labs significant for K 3.2+, H&H 8.4/24.8, and Troponin 2.57 -->2.74 w/ normal CK and CKMB. EKG shows NSR 82bpm w/ PACs & PVCs, LBBB, unchanged from EKG 2012.  Denies CP currently. Has not had exertional CP  or HF symptoms prior. Still with some abdominal pain and stool urgency.     Past Medical History  Diagnosis Date  . Goiter   . Pulmonary embolism 8/ 11  . Anxiety   . Impacted cerumen   . GERD (gastroesophageal reflux disease)   . Edema   . Depressive disorder   . Osteoporosis   . Carpal tunnel syndrome   . HTN (hypertension)   . HLD (hyperlipidemia)   . PONV (postoperative nausea and vomiting)   . Bronchitis     hx  . Bladder polyp     bladder lesions  . Pneumonia     pleursy  . LBBB (left bundle branch block)   . LBBB (left bundle branch block)   . Colon polyps     last colonoscopy when pt was around 78. No polyps reported but she did previously have small polyps  . Hiatal hernia   . Dysphagia     patient has undergone upper endoscopy with esophageal dilatation probably  around the turn of the Century for further back.     11/05/10 - Nuclear Stress Test Normal stress nuclear study. No evidence of ischemia. Normal LV function.  Surgical History:  Past Surgical History  Procedure Date  . Carpal tunnel release     x2 ( l & 1R)  . Tubal ligation   . Cholecystectomy   . Total knee arthroplasty 05,09    bilateral  . Total hip arthroplasty 05    right  . Cataract extraction  bil  . Appendectomy   . Knee arthroscopy 05    rt  . Total shoulder arthroplasty 05/09/2011    Procedure: TOTAL SHOULDER ARTHROPLASTY;  Surgeon: Senaida Lange, MD;  Location: [facility] OR;  Service: Orthopedics;  Laterality: Left;  Left Shoulder Total Arthroplasty      Home Meds: Medication Sig  ALPRAZolam (XANAX) 0.5 MG tablet Take 0.5 mg by mouth 2 (two) times daily.   amLODipine (NORVASC) 2.5 MG tablet Take 2.5 mg by mouth daily.  Ascorbic Acid (VITAMIN C) 1000 MG tablet Take 1,000 mg by mouth daily.    Cholecalciferol (VITAMIN D3) 1000 UNITS tablet Take 1,000 Units by mouth daily.    fish oil-omega-3 fatty acids 1000 MG capsule Take 2 g by mouth daily.   naproxen sodium (ANAPROX) 220 MG  tablet Take 220 mg by mouth 2 (two) times daily with a meal.  omeprazole (PRILOSEC) 20 MG capsule Take 20 mg by mouth daily.    propranolol (INDERAL) 10 MG tablet Take 10 mg by mouth daily.  pyridOXINE (VITAMIN B-6) 100 MG tablet Take 100 mg by mouth daily.  rosuvastatin (CRESTOR) 10 MG tablet Take 10 mg by mouth at bedtime.   valsartan (DIOVAN) 320 MG tablet Take 320 mg by mouth daily.      Inpatient Medications:   . amLODipine  2.5 mg Oral Daily  . atorvastatin  20 mg Oral q1800  . docusate sodium  100 mg Oral BID  . olmesartan  20 mg Oral Daily  . propranolol  10 mg Oral Daily  . senna  1 tablet Oral BID  . sodium chloride  10-40 mL Intracatheter Q12H  . sodium chloride  3 mL Intravenous Q12H   . sodium chloride 100 mL/hr at 05/23/11 1123  . pantoprozole (PROTONIX) infusion 8 mg/hr (05/23/11 1123)    Allergies: No Known Allergies  History   Social History  . Marital Status: Widowed    Spouse Name: N/A    Number of Children: 1  . Years of Education: N/A   Occupational History  . retired    Social History Main Topics  . Smoking status: Never Smoker   . Smokeless tobacco: Not on file  . Alcohol Use: No  . Drug Use: No  . Sexually Active: Yes    Birth Control/ Protection: Post-menopausal   Other Topics Concern  . Not on file   Social History Narrative  . No narrative on file     Family History  Problem Relation Age of Onset  . Heart disease    . Hypertension    . Heart attack    . Stroke       Review of Systems: General: negative for chills, fever, night sweats or weight changes.  Cardiovascular: + chest pain, negative shortness of breath, dyspnea on exertion, edema, orthopnea, palpitations, or paroxysmal nocturnal dyspnea Dermatological: negative for rash Respiratory: negative for cough or wheezing Urologic: negative for hematuria Abdominal: negative for nausea, vomiting,  + diarrhea/ab pain/melena,  Neurologic: negative for visual changes, syncope, or  dizziness All other systems reviewed and are otherwise negative except as noted above. + arthritis pain  Labs:  [facility] 05/23/11 0950 05/23/11 0130  CKTOTAL 115 116  CKMB 3.6 3.9  TROPONINI 2.74* 2.57*   Component Value Date   WBC 4.8 05/23/2011   HGB 8.4* 05/23/2011   HCT 24.8* 05/23/2011   MCV 96.5 05/23/2011   PLT 163 05/23/2011    Lab 05/23/11 0130  NA 145  K 3.2*  CL  111  CO2 27  BUN 14  CREATININE 0.81  CALCIUM 9.0  GLUCOSE 79   Radiology/Studies:   05/23/2011 - CXR Findings: New left jugular central venous catheter, tip projecting over SVC. Enlargement of cardiac silhouette with calcified tortuous aorta. Large hiatal hernia. Pulmonary vascularity normal. Lungs clear. No pneumothorax. Bones demineralized with note of a left older prosthesis.  IMPRESSION: No pneumothorax following left jugular line placement.     EKG: 05/23/11 @ 0644 - NSR 82bpm w/ PACs & PVCs, LBBB, grossly unchanged from EKG 2012  Physical Exam: Blood pressure 123/68, pulse 63, temperature 99 F (37.2 C), temperature source Oral, resp. rate 18, height 4\' 10"  (1.473 m), weight 141 lb 6.4 oz (64.139 kg), SpO2 93.00%. General: Elderly. well nourished, in no acute distress. Head: Normocephalic, atraumatic, sclera non-icteric, no xanthomas, nares are without discharge.  Neck: Supple. Negative for carotid bruits. JVD not elevated. IJ cath in neck Lungs: Clear bilaterally to auscultation without wheezes, rales, or rhonchi. Breathing is unlabored. Heart: distant RRR with S1 S2. No murmurs, rubs, or gallops appreciated. Abdomen: Soft, non-tender, non-distended with normoactive bowel sounds. No hepatomegaly. No rebound/guarding. No obvious abdominal masses. Msk:  Strength and tone appear normal for age. Extremities: No clubbing or cyanosis. No edema.  Distal pedal pulses are 2+ and equal bilaterally. L arm in sling Neuro: Alert and oriented X 3. Moves all extremities spontaneously. + tremor Psych:  Responds to  questions appropriately with a normal affect.   Assessment: 1. NSTEMI, due to demand ischemia       --normal Myoview 8/12 (pre-op) 2. PUD with pylori ulcer on EGD 3. Acute blood loss anemia due to #2 4. LBBB, chronic 5. Recent shoulder surgery   Plan/Discussion:  Darrol Jump, JESSICA PA-C 05/23/2011, 5:35 PM  Attending: Patient seen and examined with [facility]. We discussed all aspects of the encounter. I agree with the assessment and plan as stated above. I have edited note and exam as appropriate.  Ms. Jenkinson has had a small NSTEMI due to demand ischemia in the setting of GIB with profound anemia. Recent stress test was normal. Given GIB not candidate for invasive cardiac work-up. Would continue to treat medically. Can start ASA 81 when OK with GI. Check echo. Continue b-blocker and statin. Will follow.   Truman Hayward 6:41 PM

## 2011-05-23 NOTE — Op Note (Signed)
[facility] [address] [place], [place]  [address]  ENDOSCOPY PROCEDURE REPORT  PATIENT:  Christina, [name]  MR#:  [id number] BIRTHDATE:  1925/09/21, 85 yrs. old  GENDER:  female  ENDOSCOPIST:  Wilhemina Bonito. Eda Keys, MD Referred by:  [facility],  PROCEDURE DATE:  05/23/2011 PROCEDURE:  EGD with biopsy, 43239 ASA CLASS:  Class III INDICATIONS:  anemia, hemoccult positive stool, melena  MEDICATIONS:   Fentanyl 50 mcg IV, Versed 4 mg IV TOPICAL ANESTHETIC:  Cetacaine Spray  DESCRIPTION OF PROCEDURE:   After the risks benefits and alternatives of the procedure were thoroughly explained, informed consent was obtained.  The [id number] ([id number]) endoscope was introduced through the mouth and advanced to the second portion of the duodenum, without limitations.  The instrument was slowly withdrawn as the mucosa was fully examined. <<PROCEDUREIMAGES>>  A benign ring-like stricture was found in the distal esophagus. Mild Esophagitis was found in the distal esophagus.  A large hiatal hernia was found.  A62mm clean based ulcer was found at the pylorus.  No active bleeding or blood seen.  The duodenal bulb and second duodenum were without abnormalities.  CLO BX Taken Retroflexed views revealed the hiatal hernia.    The scope was then withdrawn from the patient and the procedure completed.  COMPLICATIONS:  None  ENDOSCOPIC IMPRESSION: 1) Stricture in the distal esophagus 2) Esophagitis in the distal esophagus 3) Hiatal hernia 4) Ulcer at the pylorus - likely cause of GI bleed 5) No active bleeding or blood seen  RECOMMENDATIONS: 1) Avoid NSAIDS 2) Continue PPI - daily 3) Rx CLO if positive 4) Follow-up prn (primary GI in [place])  DISCUSSED WIH DAUGHTER .  WILL SIGN OFF  ______________________________ Wilhemina Bonito. Eda Keys, MD  CC:  Foye Deer, MD;  The Patient  n. eSIGNED:   Wilhemina Bonito. Eda Keys at 05/23/2011 02:29 PM  Cheral Almas, [phone]

## 2011-05-23 NOTE — Progress Notes (Signed)
   CARE MANAGEMENT NOTE 05/23/2011  Patient:  Christina, [name]   Account Number:  [id number]  Date Initiated:  05/23/2011  Documentation initiated by:  Junius Creamer  Subjective/Objective Assessment:   adm w ugib     Action/Plan:   from [facility] where was getting rehab   Anticipated DC Date:  05/25/2011   Anticipated DC Plan:  SKILLED NURSING FACILITY  In-house referral  Clinical Social Worker      DC Planning Services  CM consult      Choice offered to / List presented to:             Status of service:   Medicare Important Message given?   (If response is "NO", the following Medicare IM given date fields will be blank) Date Medicare IM given:   Date Additional Medicare IM given:    Discharge Disposition:    Per UR Regulation:    Comments:  3/7 debbie Mariluz Crespo rn,bsn [phone]

## 2011-05-23 NOTE — Progress Notes (Signed)
Troponin level is 2.57, on call [facility] physician was notified, no new order given.

## 2011-05-23 NOTE — H&P (Addendum)
PCP:   Paulina Fusi, MD, MD   Chief Complaint:  Dyspepsia, black stools.   HPI: 85yoF with h/o LBBB, PE 10/2010 s/p 6 mos Coumadin, HTN / HL presents with melena and anemia, positive Troponin.   Pt was last admitted to Ortho for left shoulder procedure in 04/2011 after which she was discharged to [facility]. While there, she reports having had black stools for the past couple weeks, with some minimal epigastric dyspepsia but without overt pain. She has also NOT been taking any regular scheduled NSAID's (although this does appear on her MAR, she denies anything regularly), and doesn't drink alcohol. She has been having some minimal dizziness but no LOC. Last night, she went to bed and experienced much worse dyspepsia with belching and epigastric pain, for which they gave her phenergan and pepto that didn't help. Symptoms persisted through the day for which labs were drawn and showed anemia, so she was sent to [facility]. Of note, she denies any crushing substernal chest  pain, SOB, or any BRBPR. She keeps pointing to her epigastrium as the source of her sxs, not her chest. She has had previous colo with polyps, but not sure if prior EGD.   Labs at [place]: WBC 5.7, Hct 18.4 with MCV 100, plts 195. INR 1.1.  LDH normal. Chem panel with Na 147, Cl 111, renal 17 / 0.8. Albumin 2.9 Trop 1.17 eleavted with CK total 64 -- ? MB.  UA negative.  CXR there: stable cardiac enlargement, large hiatal hernia, and mild bronchitic type changes but no infiltrates.  Pt was given 10 mg IV bolus and drip, then 2u PRBC's. She had central line placed due to difficult access.   Of note, in preparation for ortho procedures, pt had nuclear stress test called as normal in 10/2010 with normal LV fxn, no evidence of ischemia.   ROS as above otherwise unremarkable. She is feeling OK now.   Past Medical History  Diagnosis Date  . Goiter   . Pulmonary embolism 8/ 11  . Anxiety   . Impacted cerumen   . GERD  (gastroesophageal reflux disease)   . Edema   . Depressive disorder   . Osteoporosis   . Carpal tunnel syndrome   . HTN (hypertension)   . HLD (hyperlipidemia)   . PONV (postoperative nausea and vomiting)   . Bronchitis     hx  . Bladder polyp     bladder lesions  . Pneumonia     pleursy  . LBBB (left bundle branch block)   . LBBB (left bundle branch block)     Past Surgical History  Procedure Date  . Carpal tunnel release     x2 ( l & 1R)  . Tubal ligation   . Cholecystectomy   . Total knee arthroplasty 05,09    bilateral  . Total hip arthroplasty 05    right  . Cataract extraction     bil  . Appendectomy   . Knee arthroscopy 05    rt  . Total shoulder arthroplasty 05/09/2011    Procedure: TOTAL SHOULDER ARTHROPLASTY;  Surgeon: Senaida Lange, MD;  Location: [facility] OR;  Service: Orthopedics;  Laterality: Left;  Left Shoulder Total Arthroplasty     Medications:  HOME MEDS: Reconciled by pharmacy with rehab list Prior to Admission medications   Medication Sig Start Date End Date Taking? Authorizing Provider  ALPRAZolam Prudy Feeler) 0.5 MG tablet Take 0.5 mg by mouth 2 (two) times daily.    Yes Historical Provider,  MD  amLODipine (NORVASC) 2.5 MG tablet Take 2.5 mg by mouth daily. 10/22/10 10/22/11 Yes Iran Ouch, MD  Ascorbic Acid (VITAMIN C) 1000 MG tablet Take 1,000 mg by mouth daily.     Yes Historical Provider, MD  Cholecalciferol (VITAMIN D3) 1000 UNITS tablet Take 1,000 Units by mouth daily.     Yes Historical Provider, MD  fish oil-omega-3 fatty acids 1000 MG capsule Take 2 g by mouth daily.    Yes Historical Provider, MD  naproxen sodium (ANAPROX) 220 MG tablet Take 220 mg by mouth 2 (two) times daily with a meal.   Yes Historical Provider, MD  omeprazole (PRILOSEC) 20 MG capsule Take 20 mg by mouth daily.     Yes Historical Provider, MD  propranolol (INDERAL) 10 MG tablet Take 10 mg by mouth daily.   Yes Historical Provider, MD  pyridOXINE (VITAMIN B-6) 100 MG tablet  Take 100 mg by mouth daily.   Yes Historical Provider, MD  rosuvastatin (CRESTOR) 10 MG tablet Take 10 mg by mouth at bedtime.    Yes Historical Provider, MD  valsartan (DIOVAN) 320 MG tablet Take 320 mg by mouth daily.     Yes Historical Provider, MD    Allergies:  No Known Allergies  Social History:   reports that she has never smoked. She does not have any smokeless tobacco history on file. She reports that she does not drink alcohol or use illicit drugs. Was living at home before recent admission to rehab at [facility]. Has a daughter Eunice Blase   Family History: Family History  Problem Relation Age of Onset  . Heart disease    . Hypertension    . Heart attack    . Stroke      Physical Exam: Filed Vitals:   05/22/11 2045 05/22/11 2100 05/22/11 2300  BP: 144/70 136/69 118/66  Pulse: 81 84 81  Temp: 99.1 F (37.3 C) 99.1 F (37.3 C) 99 F (37.2 C)  TempSrc: Oral Oral Oral  Resp: 16 18 16   SpO2: 100% 100% 99%   Blood pressure 118/66, pulse 81, temperature 99 F (37.2 C), temperature source Oral, resp. rate 16, SpO2 99.00%. Gen: Elderly, well appearing F in no distress, talkative, but able to relate history well, breathing comfortably HEENT: Pupils round, reactive, normal appearing, EOMI, sclera overall clear. Conjuctivae minimally pale, Mouth moist, normal appearing, no lesions Lungs: CTAB no w/c/r, good air movement, overall normal Heart: regular, minimally tachycardic but not overly so, with minimal early peaking systolic murmur loudest at BUSB's, decreases towards apex Abd: Soft, non rigid or peritoneal, not tender, no facial grimacing with palpation or subjective pain. Normal exam overall Extrem: Warm, perfusing well, radials palpable, no BLE soft pitting edema, but with superficial varicosities Neuro: Alert, attentive, conversing well, CN 2-12 intact, no slurred speech or droop, moves extremities well, able to sit up in bed with some assistance, grossly non-focal.    Labs &  Imaging No results found for this or any previous visit (from the past 48 hour(s)). No results found.   ECG: NSR with normal axis, normal P and PR. wide QRS in LBBB pattern, therefore hard to interpret ST segments, however there is concordance of the QRS segment and the T waves which is abnormal in LBBB. Also these T waves appear different compared to prior.   Impression Present on Admission:  .UGIB (upper gastrointestinal bleed) .Anemia .LBBB (left bundle branch block) .NSTEMI (non-ST elevated myocardial infarction) .HTN (hypertension) .HLD (hyperlipidemia)  85yoF with h/o LBBB,  PE 10/2010 s/p 6 mos Coumadin, HTN / HL presents with melena and anemia, positive Troponin.   1. Anemia and melena: Suspect this is an UGIB for the past 1-2 weeks, but not sure precipitant. She is HD stable and s/p 2u PRBC's.  - Guaic all stools, trend CBC, may need more blood. Continue IV protonix drip and will need GI consultation for EGD. Keep NPO  2. Positive cardiac enzymes: Difficult to assess ECG in setting of known LBBB, but 10/2010 stress test negative. Suspect this is ischemia due to anemia, and not an ACS. Therefore, will transfuse, treat as above. Dr. Myrtis Ser in cardiology was consulted in the ED at [facility] and by report will see the pt in the am.  - Trend cardiac enzymes and ECG. Will NOT give ASA or other ACS treatment for now. Follow up cards recs.   3. HyperNa and hyperCl: Likely minimally free water deficit, will monitor for now.   4. HTN: continue home amlodipine, propanolol, lipitor, ACEi, BP is stable.   Telemetry, [facility] team 6 Full code, discussed.   Other plans as per orders.  Darlyn Repsher 05/23/2011, 12:27 AM   Addendum 213Y: Have spoken to GI who have agreed to come see patient, will need to get fecal occult blood which has been ordered

## 2011-05-23 NOTE — Progress Notes (Addendum)
LATE ENTRY : Patient seen at 10:50AM Subjective: Pt denies chest pain, also no melena and no hematochezia today. Family at bedside Objective: Vital signs in last 24 hours: Temp:  [97.9 F (36.6 C)-99.2 F (37.3 C)] 99.2 F (37.3 C) (03/07 2100) Pulse Rate:  [63-81] 80  (03/07 2100) Resp:  [12-32] 18  (03/07 2100) BP: (115-151)/(50-98) 126/77 mmHg (03/07 2100) SpO2:  [90 %-99 %] 91 % (03/07 2100) FiO2 (%):  [28 %] 28 % (03/07 0031) Last BM Date: 05/22/11 Intake/Output from previous day:   Intake/Output this shift:      General Appearance:    Alert, cooperative, no distress, appears stated age  Lungs:     Clear to auscultation bilaterally, respirations unlabored   Heart:    Regular rate and rhythm, S1 and S2 normal, no murmur, rub   or gallop  Abdomen:     Soft, non-tender, bowel sounds active all four quadrants,    no masses, no organomegaly  Extremities:   Extremities normal, atraumatic, no cyanosis or edema  Neurologic:   CNII-XII intact, nonfocal.       Weight change:   Intake/Output Summary (Last 24 hours) at 05/23/11 2228 Last data filed at 05/23/11 1700  Gross per 24 hour  Intake    200 ml  Output      0 ml  Net    200 ml    Lab Results:   Basename 05/23/11 0130  NA 145  K 3.2*  CL 111  CO2 27  GLUCOSE 79  BUN 14  CREATININE 0.81  CALCIUM 9.0    Basename 05/23/11 0950 05/23/11 0130  WBC 4.8 5.7  HGB 8.4* 8.5*  HCT 24.8* 24.8*  PLT 163 160  MCV 96.5 95.8   PT/INR No results found for this basename: LABPROT:2,INR:2 in the last 72 hours ABG No results found for this basename: PHART:2,PCO2:2,PO2:2,HCO3:2 in the last 72 hours  Micro Results: No results found for this or any previous visit (from the past 240 hour(s)). Studies/Results: Dg Chest Port 1 View  05/23/2011  *RADIOLOGY REPORT*  Clinical Data: Central line placement  PORTABLE CHEST - 1 VIEW  Comparison: Portable exam 0028 hours compared to 05/01/2011  Findings: New left jugular central  venous catheter, tip projecting over SVC. Enlargement of cardiac silhouette with calcified tortuous aorta. Large hiatal hernia. Pulmonary vascularity normal. Lungs clear. No pneumothorax. Bones demineralized with note of a left older prosthesis.  IMPRESSION: No pneumothorax following left jugular line placement.  Original Report Authenticated By: Lollie Marrow, M.D.   Medications:  Scheduled Meds:   . amLODipine  2.5 mg Oral Daily  . atorvastatin  20 mg Oral q1800  . docusate sodium  100 mg Oral BID  . olmesartan  20 mg Oral Daily  . potassium chloride  40 mEq Oral Once  . propranolol  10 mg Oral Daily  . senna  1 tablet Oral BID  . sodium chloride  10-40 mL Intracatheter Q12H  . sodium chloride  3 mL Intravenous Q12H   Continuous Infusions:   . sodium chloride 100 mL/hr at 05/23/11 1123  . pantoprozole (PROTONIX) infusion 8 mg/hr (05/23/11 1123)   PRN Meds:.acetaminophen, acetaminophen, ondansetron (ZOFRAN) IV, ondansetron, sodium chloride, DISCONTD: butamben-tetracaine-benzocaine, DISCONTD: fentaNYL, DISCONTD: midazolam Assessment/Plan: Patient Active Hospital Problem List: UGIB (upper gastrointestinal bleed) (05/23/2011)  -Monitor h/h and further transfuse accordinlgy - GI planning endo this afternoon, follow. HTN (hypertension)   -continue outpt meds HLD (hyperlipidemia)   Anemia 2/2 UGIB, follow as above LBBB (  left bundle branch block) /NSTEMI (non-ST elevated  myocardial infarction)  -likely 2/2 to demand ischemia to to anemia -pt CP free today,continue medical management.    LOS: 1 day   Raneshia Derick C 05/23/2011, 10:28 PM

## 2011-05-23 NOTE — Progress Notes (Signed)
Clinical Social Worker attempted to speak with patient, but he was out of the room. CSW will re-attempt later.   Rozetta Nunnery MSW, [employer] [phone]

## 2011-05-24 DIAGNOSIS — K253 Acute gastric ulcer without hemorrhage or perforation: Secondary | ICD-10-CM | POA: Diagnosis not present

## 2011-05-24 DIAGNOSIS — I214 Non-ST elevation (NSTEMI) myocardial infarction: Secondary | ICD-10-CM | POA: Diagnosis not present

## 2011-05-24 DIAGNOSIS — K922 Gastrointestinal hemorrhage, unspecified: Secondary | ICD-10-CM | POA: Diagnosis not present

## 2011-05-24 DIAGNOSIS — I059 Rheumatic mitral valve disease, unspecified: Secondary | ICD-10-CM

## 2011-05-24 DIAGNOSIS — R112 Nausea with vomiting, unspecified: Secondary | ICD-10-CM | POA: Diagnosis not present

## 2011-05-24 DIAGNOSIS — I10 Essential (primary) hypertension: Secondary | ICD-10-CM | POA: Diagnosis not present

## 2011-05-24 HISTORY — PX: TRANSTHORACIC ECHOCARDIOGRAM: SHX275

## 2011-05-24 LAB — CBC
Hemoglobin: 8.7 g/dL — ABNORMAL LOW (ref 12.0–15.0)
MCH: 32.6 pg (ref 26.0–34.0)
MCV: 97.8 fL (ref 78.0–100.0)
RBC: 2.67 MIL/uL — ABNORMAL LOW (ref 3.87–5.11)

## 2011-05-24 LAB — BASIC METABOLIC PANEL
CO2: 25 mEq/L (ref 19–32)
Calcium: 8.7 mg/dL (ref 8.4–10.5)
Creatinine, Ser: 0.81 mg/dL (ref 0.50–1.10)
Glucose, Bld: 75 mg/dL (ref 70–99)

## 2011-05-24 MED ORDER — POTASSIUM CHLORIDE CRYS ER 20 MEQ PO TBCR: 40.0000 meq | EXTENDED_RELEASE_TABLET | Freq: Once | ORAL | Status: DC

## 2011-05-24 MED ORDER — ASPIRIN 81 MG PO CHEW
81.0000 mg | CHEWABLE_TABLET | Freq: Every day | ORAL | Status: DC
  Administered 2011-05-24 – 2011-05-25 (×2): 81 mg via ORAL
  Filled 2011-05-24 (×2): qty 1

## 2011-05-24 MED ORDER — POTASSIUM CHLORIDE CRYS ER 20 MEQ PO TBCR
40.0000 meq | EXTENDED_RELEASE_TABLET | Freq: Once | ORAL | Status: AC
  Administered 2011-05-24: 40 meq via ORAL
  Filled 2011-05-24: qty 2

## 2011-05-24 NOTE — Progress Notes (Signed)
  Echocardiogram 2D Echocardiogram has been performed.  Mustafa Potts, Real Cons 05/24/2011, 1:42 PM

## 2011-05-24 NOTE — Progress Notes (Signed)
Subjective: Pt denies chest pain,+BM this am- brown with no evidence of bleeding Objective: Vital signs in last 24 hours: Temp:  [97.9 F (36.6 C)-99.2 F (37.3 C)] 98.6 F (37 C) (03/08 0500) Pulse Rate:  [63-80] 78  (03/08 0935) Resp:  [12-32] 18  (03/08 0500) BP: (115-151)/(50-98) 138/66 mmHg (03/08 0935) SpO2:  [90 %-99 %] 96 % (03/08 0500) Last BM Date: 05/24/11 Intake/Output from previous day: 03/07 0701 - 03/08 0700 In: 200 [P.O.:200] Out: -  Intake/Output this shift: Total I/O In: 360 [P.O.:360] Out: -     General Appearance:    Alert, cooperative, no distress, appears stated age  Lungs:     Clear to auscultation bilaterally, respirations unlabored   Heart:    Regular rate and rhythm, S1 and S2 normal, no murmur, rub   or gallop  Abdomen:     Soft, non-tender, bowel sounds active all four quadrants,    no masses, no organomegaly  Extremities:   Extremities normal, atraumatic, no cyanosis or edema  Neurologic:   CNII-XII intact, nonfocal.       Weight change:   Intake/Output Summary (Last 24 hours) at 05/24/11 1139 Last data filed at 05/24/11 0800  Gross per 24 hour  Intake    560 ml  Output      0 ml  Net    560 ml    Lab Results:   Basename 05/24/11 0538 05/23/11 0130  NA 142 145  K 3.2* 3.2*  CL 110 111  CO2 25 27  GLUCOSE 75 79  BUN 9 14  CREATININE 0.81 0.81  CALCIUM 8.7 9.0    Basename 05/24/11 0538 05/23/11 0950  WBC 5.2 4.8  HGB 8.7* 8.4*  HCT 26.1* 24.8*  PLT 161 163  MCV 97.8 96.5   PT/INR No results found for this basename: LABPROT:2,INR:2 in the last 72 hours ABG No results found for this basename: PHART:2,PCO2:2,PO2:2,HCO3:2 in the last 72 hours  Micro Results: No results found for this or any previous visit (from the past 240 hour(s)). Studies/Results: Dg Chest Port 1 View  05/23/2011  *RADIOLOGY REPORT*  Clinical Data: Central line placement  PORTABLE CHEST - 1 VIEW  Comparison: Portable exam 0028 hours compared to  05/01/2011  Findings: New left jugular central venous catheter, tip projecting over SVC. Enlargement of cardiac silhouette with calcified tortuous aorta. Large hiatal hernia. Pulmonary vascularity normal. Lungs clear. No pneumothorax. Bones demineralized with note of a left older prosthesis.  IMPRESSION: No pneumothorax following left jugular line placement.  Original Report Authenticated By: Lollie Marrow, M.D.   Medications:  Scheduled Meds:    . amLODipine  2.5 mg Oral Daily  . aspirin  81 mg Oral Daily  . atorvastatin  20 mg Oral q1800  . docusate sodium  100 mg Oral BID  . olmesartan  20 mg Oral Daily  . potassium chloride  40 mEq Oral Once  . potassium chloride  40 mEq Oral Once  . potassium chloride  40 mEq Oral Once  . propranolol  10 mg Oral Daily  . senna  1 tablet Oral BID  . sodium chloride  10-40 mL Intracatheter Q12H  . sodium chloride  3 mL Intravenous Q12H   Continuous Infusions:    . sodium chloride 100 mL/hr at 05/23/11 1123  . pantoprozole (PROTONIX) infusion 8 mg/hr (05/23/11 2304)   PRN Meds:.acetaminophen, acetaminophen, ondansetron (ZOFRAN) IV, ondansetron, sodium chloride, DISCONTD: butamben-tetracaine-benzocaine, DISCONTD: fentaNYL, DISCONTD: midazolam Assessment/Plan: Patient Active Hospital Problem List: UGIB/ (upper  gastrointestinal bleed) (05/23/2011)  -Monitor h/h and further transfuse accordinlgy -continue PPI, change to Q12 beginning in am -pt to f/u with her primary GI upon d/c HTN (hypertension)   -continue outpt meds HLD (hyperlipidemia)   Anemia 2/2 UGIB, follow as above hh stable, follow recheck in am LBBB (left bundle branch block) /NSTEMI (non-ST elevated  myocardial infarction)  -likely 2/2 to demand ischemia to to anemia -awaiting 2D-echo -remains CP free today,continue medical management. -discussed with GI/Dr Marina Goodell and he states o to start ASA 81mg  now so long as pt on PPI Appreciate cards assistance  Hypokalemia -replace k  LOS:  2 days   Christina [name] C 05/24/2011, 11:39 AM

## 2011-05-24 NOTE — Progress Notes (Signed)
Physical Therapy Treatment Patient Details Name: Christina [name] MRN: [id number] DOB: 12-Dec-1925 Today's Date: 05/24/2011  PT Assessment/Plan  PT - Assessment/Plan Comments on Treatment Session: pt has completed all appropriate left  shd. exercises to bridge her stay until she can continue rehab at [facility] and OPPT/OT PT Plan: Discharge plan remains appropriate Follow Up Recommendations: Skilled nursing facility Equipment Recommended: Defer to next venue PT Goals  Acute Rehab PT Goals PT Goal: Supine/Side to Sit - Progress: Progressing toward goal PT Transfer Goal: Bed to Chair/Chair to Bed - Progress: Progressing toward goal PT Goal: Ambulate - Progress: Progressing toward goal  PT Treatment Precautions/Restrictions  Precautions Precautions: Shoulder;Fall Type of Shoulder Precautions: PROM shoulder 90 FF, 60ABD, 30 ER.  Pendulums. Other Brace/Splint: sling to LUE at all times except during exercises and bathing. (expect protocols will change at f/u monday 3/11) Restrictions Weight Bearing Restrictions: Yes LUE Weight Bearing: Non weight bearing Mobility (including Balance) Bed Mobility Bed Mobility: Yes Supine to Sit: Other (comment);HOB flat (min guard A) Sitting - Scoot to Edge of Bed: 7: Independent Transfers Transfers: Yes Sit to Stand: 5: Supervision Ambulation/Gait Ambulation/Gait: Yes Ambulation/Gait Assistance: 5: Supervision Ambulation/Gait Assistance Details (indicate cue type and reason): generally steady pushing the IV pole Ambulation Distance (Feet): 200 Feet Assistive device: None (pushing IV pole) Gait Pattern: Within Functional Limits  Posture/Postural Control Posture/Postural Control: No significant limitations Exercise  Shoulder Exercises Pendulum Exercise: PROM;AAROM;10 reps;Standing;Other (comment) (pt having trouble staying passive) Shoulder Flexion: PROM;Left;10 reps;Seated (limited to 90 degrees) Shoulder ABduction: PROM;AAROM;10 reps;Seated  (limited to 60 degrees) Shoulder External Rotation: Left;10 reps;Seated (limited to 30 degrees) End of Session PT - End of Session Patient left: in bed;with call bell in reach;with family/visitor present Nurse Communication: Mobility status for transfers General Behavior During Session: [facility] for tasks performed Cognition: [facility] for tasks performed  Fowler Antos, Eliseo Gum 05/24/2011, 5:07 PM  05/24/2011  Barry Bing, PT [phone] (pager)

## 2011-05-24 NOTE — Progress Notes (Signed)
@   Subjective:  Denies CP or dyspnea   Objective:  Filed Vitals:   05/23/11 1526 05/23/11 1547 05/23/11 2100 05/24/11 0500  BP: 118/85 123/68 126/77 148/87  Pulse:  63 80 75  Temp:  99 F (37.2 C) 99.2 F (37.3 C) 98.6 F (37 C)  TempSrc:  Oral Oral Oral  Resp: 17 18 18 18   Height:      Weight:      SpO2: 92% 93% 91% 96%    Intake/Output from previous day:  Intake/Output Summary (Last 24 hours) at 05/24/11 0815 Last data filed at 05/23/11 1700  Gross per 24 hour  Intake    200 ml  Output      0 ml  Net    200 ml    Physical Exam: Physical exam: Well-developed well-nourished in no acute distress.  Skin is warm and dry.  HEENT is normal.  Neck is supple. No thyromegaly.  Chest is clear to auscultation with normal expansion.  Cardiovascular exam is regular rate and rhythm.  Abdominal exam nontender or distended. No masses palpated. Extremities show trace edema. neuro grossly intact    Lab Results: Basic Metabolic Panel:  Basename 05/24/11 0538 05/23/11 0130  NA 142 145  K 3.2* 3.2*  CL 110 111  CO2 25 27  GLUCOSE 75 79  BUN 9 14  CREATININE 0.81 0.81  CALCIUM 8.7 9.0  MG -- --  PHOS -- --   CBC:  Basename 05/24/11 0538 05/23/11 0950  WBC 5.2 4.8  NEUTROABS -- --  HGB 8.7* 8.4*  HCT 26.1* 24.8*  MCV 97.8 96.5  PLT 161 163   Cardiac Enzymes:  Basename 05/23/11 1700 05/23/11 0950 05/23/11 0130  CKTOTAL 113 115 116  CKMB 3.1 3.6 3.9  CKMBINDEX -- -- --  TROPONINI 2.60* 2.74* 2.57*     Assessment/Plan:  1) NSTEMI - felt related to demand ischemia from GI bleed. Continue Betablocker and statin. Add ASA later when ok with GI. Will most likely need cath in the future after she recovers from ulcer. Cannot proceed with invasive eval now due to GI bleed and need for anticoagulation with any intervention. Await echo for LV function. 2) GI bleed - related to ulcer; contine protonix. 3) Hypokalemia - supplement K. 4) Hypertension - Continue present  meds  Olga Millers 05/24/2011, 8:15 AM

## 2011-05-25 DIAGNOSIS — R112 Nausea with vomiting, unspecified: Secondary | ICD-10-CM | POA: Diagnosis not present

## 2011-05-25 DIAGNOSIS — G8918 Other acute postprocedural pain: Secondary | ICD-10-CM | POA: Diagnosis not present

## 2011-05-25 DIAGNOSIS — M810 Age-related osteoporosis without current pathological fracture: Secondary | ICD-10-CM | POA: Diagnosis not present

## 2011-05-25 DIAGNOSIS — I509 Heart failure, unspecified: Secondary | ICD-10-CM | POA: Diagnosis not present

## 2011-05-25 DIAGNOSIS — D649 Anemia, unspecified: Secondary | ICD-10-CM | POA: Diagnosis not present

## 2011-05-25 DIAGNOSIS — G5600 Carpal tunnel syndrome, unspecified upper limb: Secondary | ICD-10-CM | POA: Diagnosis not present

## 2011-05-25 DIAGNOSIS — E785 Hyperlipidemia, unspecified: Secondary | ICD-10-CM | POA: Diagnosis not present

## 2011-05-25 DIAGNOSIS — K922 Gastrointestinal hemorrhage, unspecified: Secondary | ICD-10-CM | POA: Diagnosis not present

## 2011-05-25 DIAGNOSIS — R609 Edema, unspecified: Secondary | ICD-10-CM | POA: Diagnosis not present

## 2011-05-25 DIAGNOSIS — I2510 Atherosclerotic heart disease of native coronary artery without angina pectoris: Secondary | ICD-10-CM | POA: Diagnosis not present

## 2011-05-25 DIAGNOSIS — Z96619 Presence of unspecified artificial shoulder joint: Secondary | ICD-10-CM | POA: Diagnosis not present

## 2011-05-25 DIAGNOSIS — I214 Non-ST elevation (NSTEMI) myocardial infarction: Secondary | ICD-10-CM | POA: Diagnosis not present

## 2011-05-25 DIAGNOSIS — I447 Left bundle-branch block, unspecified: Secondary | ICD-10-CM | POA: Diagnosis not present

## 2011-05-25 DIAGNOSIS — K222 Esophageal obstruction: Secondary | ICD-10-CM | POA: Diagnosis not present

## 2011-05-25 DIAGNOSIS — K219 Gastro-esophageal reflux disease without esophagitis: Secondary | ICD-10-CM | POA: Diagnosis not present

## 2011-05-25 DIAGNOSIS — D62 Acute posthemorrhagic anemia: Secondary | ICD-10-CM | POA: Diagnosis not present

## 2011-05-25 DIAGNOSIS — I219 Acute myocardial infarction, unspecified: Secondary | ICD-10-CM | POA: Diagnosis not present

## 2011-05-25 DIAGNOSIS — K284 Chronic or unspecified gastrojejunal ulcer with hemorrhage: Secondary | ICD-10-CM | POA: Diagnosis not present

## 2011-05-25 DIAGNOSIS — F329 Major depressive disorder, single episode, unspecified: Secondary | ICD-10-CM | POA: Diagnosis not present

## 2011-05-25 DIAGNOSIS — I10 Essential (primary) hypertension: Secondary | ICD-10-CM | POA: Diagnosis not present

## 2011-05-25 DIAGNOSIS — J40 Bronchitis, not specified as acute or chronic: Secondary | ICD-10-CM | POA: Diagnosis not present

## 2011-05-25 LAB — BASIC METABOLIC PANEL
BUN: 5 mg/dL — ABNORMAL LOW (ref 6–23)
GFR calc non Af Amer: 77 mL/min — ABNORMAL LOW (ref >90)
Glucose, Bld: 88 mg/dL (ref 70–99)
Potassium: 3.6 mEq/L (ref 3.5–5.1)

## 2011-05-25 LAB — CBC
HCT: 26.5 % — ABNORMAL LOW (ref 36.0–46.0)
Hemoglobin: 8.8 g/dL — ABNORMAL LOW (ref 12.0–15.0)
MCH: 32.4 pg (ref 26.0–34.0)
MCHC: 33.2 g/dL (ref 30.0–36.0)

## 2011-05-25 MED ORDER — ALPRAZOLAM ER 0.5 MG PO TB24: 0.5000 mg | ORAL_TABLET | Freq: Every day | ORAL | Status: DC

## 2011-05-25 MED ORDER — PANTOPRAZOLE SODIUM 40 MG PO TBEC: 40.0000 mg | DELAYED_RELEASE_TABLET | Freq: Every day | ORAL | Status: DC

## 2011-05-25 MED ORDER — ALPRAZOLAM 0.5 MG PO TABS
0.5000 mg | ORAL_TABLET | Freq: Every day | ORAL | Status: DC
  Administered 2011-05-25: 0.5 mg via ORAL
  Filled 2011-05-25: qty 1

## 2011-05-25 MED ORDER — ASPIRIN 81 MG PO CHEW: 81.0000 mg | CHEWABLE_TABLET | Freq: Every day | ORAL | Status: DC

## 2011-05-25 MED ORDER — PANTOPRAZOLE SODIUM 40 MG PO TBEC: 40.0000 mg | DELAYED_RELEASE_TABLET | Freq: Two times a day (BID) | ORAL | Status: DC

## 2011-05-25 NOTE — Progress Notes (Addendum)
CSW spoke with [facility] in [place] who stated it was ok for Pt to return today.  CSW informed CN, MD. Family to transport when medically ready. Milus Banister MSW,LCSW w/e Coverage [phone]

## 2011-05-25 NOTE — Discharge Summary (Signed)
Discharge Note  Name: Christina [name] MRN: [id number] DOB: 1925/04/02 78 y.o.  Date of Admission: 05/22/2011  9:00 PM Date of Discharge: 05/25/2011 Attending Physician: Kela Millin, MD  Discharge Diagnosis: Principal Problem:  *UGIB (upper gastrointestinal bleed) Gastric ulcer, acute  Anemia NSTEMI (non-ST elevated myocardial infarction)  Reflux esophagitis  Stricture and stenosis of esophagus Active Problems:  HTN (hypertension)  HLD (hyperlipidemia) LBBB (left bundle branch block)       Discharge Medications: Medication List  As of 05/25/2011  1:25 PM   STOP taking these medications         naproxen sodium 220 MG tablet      omeprazole 20 MG capsule      zolpidem 5 MG tablet         TAKE these medications         ALPRAZolam 0.5 MG tablet   Commonly known as: XANAX   Take 0.5 mg by mouth 2 (two) times daily.      amLODipine 2.5 MG tablet   Commonly known as: NORVASC   Take 2.5 mg by mouth daily.      aspirin 81 MG chewable tablet   Chew 1 tablet (81 mg total) by mouth daily.      cholecalciferol 1000 UNITS tablet   Commonly known as: VITAMIN D   Take 1,000 Units by mouth daily.      fish oil-omega-3 fatty acids 1000 MG capsule   Take 2 g by mouth daily.      pantoprazole 40 MG tablet   Commonly known as: PROTONIX   Take 1 tablet (40 mg total) by mouth daily.      propranolol 10 MG tablet   Commonly known as: INDERAL   Take 10 mg by mouth daily.      pyridOXINE 100 MG tablet   Commonly known as: VITAMIN B-6   Take 100 mg by mouth daily.      rosuvastatin 10 MG tablet   Commonly known as: CRESTOR   Take 10 mg by mouth at bedtime.      valsartan 320 MG tablet   Commonly known as: DIOVAN   Take 320 mg by mouth daily.      vitamin C 1000 MG tablet   Take 1,000 mg by mouth daily.            Disposition and follow-up:   Christina [name] was discharged [facility] in improved/stable condition.    Follow-up  Appointments: Discharge Orders    Future Orders Please Complete By Expires   Diet - low sodium heart healthy      Increase activity slowly         Consultations: Treatment Team:  [facility], MD-Dr Jens Som  Procedures Performed:  Dg Chest 2 View  05/01/2011  *RADIOLOGY REPORT*  Clinical Data: Preop left shoulder replacement.  Hypertension  CHEST - 2 VIEW  Comparison: 02/28/2009  Findings: Heart size upper normal.  Large hiatal hernia.  Negative for heart failure.  Lungs are clear without infiltrate or effusion.  IMPRESSION: Hiatal hernia.  No acute cardiopulmonary disease.  Original Report Authenticated By: Camelia Phenes, M.D.   Dg Chest Port 1 View  05/23/2011  *RADIOLOGY REPORT*  Clinical Data: Central line placement  PORTABLE CHEST - 1 VIEW  Comparison: Portable exam 0028 hours compared to 05/01/2011  Findings: New left jugular central venous catheter, tip projecting over SVC. Enlargement of cardiac silhouette with calcified tortuous aorta. Large hiatal hernia. Pulmonary vascularity  normal. Lungs clear. No pneumothorax. Bones demineralized with note of a left older prosthesis.  IMPRESSION: No pneumothorax following left jugular line placement.  Original Report Authenticated By: Lollie Marrow, M.D.   EGD on 3/7 ENDOSCOPIC IMPRESSION:  1) Stricture in the distal esophagus  2) Esophagitis in the distal esophagus  3) Hiatal hernia  4) Ulcer at the pylorus - likely cause of GI bleed  5) No active bleeding or blood seen  RECOMMENDATIONS:  1) Avoid NSAIDS  2) Continue PPI - daily  3) Rx CLO if positive -CLOtest negative on 3/9 4) Follow-up prn (primary GI in [place])  2D Echo Study Conclusions  - Left ventricle: The cavity size was normal. Wall thickness was increased in a pattern of mild LVH. The estimated ejection fraction was 55%. There is mild hypokinesis of the basal-midinferior and inferoseptal myocardium. Doppler parameters are consistent with abnormal left  ventricular relaxation (grade 1 diastolic dysfunction). Doppler parameters are consistent with elevated mean left atrial filling pressure. - Aortic valve: Mildly calcified annulus. Trileaflet; mildly calcified leaflets. Trivial regurgitation. - Mitral valve: Calcified annulus. Mild to moderate regurgitation directed eccentrically. - Left atrium: The atrium was mildly dilated. - Tricuspid valve: Mild regurgitation. - Pulmonary arteries: Systolic pressure was moderately increased. PA peak pressure: 48mm Hg (S). - Pericardium, extracardiac: There was no pericardial effusion.   Admission HPI Patient is an 85yoF with h/o LBBB, PE 10/2010 s/p 6 mos Coumadin, HTN / HL presents with melena and anemia, positive Troponin. She was last admitted to Ortho for left shoulder procedure in 04/2011 after which she was discharged to [facility]. While there, she reports having had black stools for the past couple weeks, with some minimal epigastric dyspepsia but without overt pain. She has also NOT been taking any regular scheduled NSAID's (although this does appear on her MAR, she denies anything regularly), and doesn't drink alcohol. She has been having some minimal dizziness but no LOC. Last night, she went to bed and experienced much worse dyspepsia with belching and epigastric pain, for which they gave her phenergan and pepto that didn't help. Symptoms persisted through the day for which labs were drawn and showed anemia, so she was sent to [facility]. Of note, she denies any crushing substernal chest  pain, SOB, or any BRBPR. She keeps pointing to her epigastrium as the source of her sxs, not her chest. She has had previous colo with polyps, but not sure if prior EGD.   Physical exam General Appearance:  Alert, cooperative, no distress, appears stated age   Lungs:  Clear to auscultation bilaterally, respirations unlabored   Heart:  Regular rate and rhythm, S1 and S2 normal, no murmur, rub  or gallop     Abdomen:  Soft, non-tender, bowel sounds active all four quadrants,  no masses, no organomegaly   Extremities:  Extremities normal, atraumatic, no cyanosis or edema   Neurologic:  CNII-XII intact, nonfocal.      Hospital Course by problem list: Principal Problem:  *UGIB (upper gastrointestinal bleed) -Upon admission the patient was found to be anemic with a hematocrit of 18.4, she was transfused 2 units of packed red blood cells, hydrated with IV fluids and gastroenterology was consulted and Dr. Marina Goodell saw the patient. She was taken for EGD on 3/7 and was found to have an ulcer at the pylorus-likely cause of GI bleed, esophagitis in the distal esophagus and a stricture in the distal esophagus as well as a hiatal hernia. No active bleeding was noted. GI  recommended to avoid NSAIDS, and to continue PPI to daily. A CLOtest was done and it came back negative. Discussed patient with Dr. Marina Goodell regarding when she can be started on aspirin given her a NSTEMI, and stated that it was okay to start her on 81 mg aspirin right away as long as she is maintained on the PPI/Protonix. The patient is to followup with her primary gastroenterologist -- oral as needed. Following her transfusion her hemoglobin remained stable, she's not had any further signs of active bleeding. Gastric ulcer/pylorus, acute -As discussed above, per EGD likely cause of bleeding although no active bleeding at the time of the EGD. Patient was placed on a PPI infusion while in the hospital and him per GI recommendations she is to discharged on a daily PPI and followup with her primary gastroenterologist.  Anemia -Secondary to above, hemoglobin stable status post transfusion. NSTEMI (non-ST elevated myocardial infarction) The patient had cardiac enzymes done and her troponins were elevated. She denied any chest pain, cardiology was consulted and patient was maintained on her ACE inhibitor and beta blocker. Initially aspirin was avoided until  was cleared per GI and then the patient was placed on a 81 mg daily. Dr. Jens Som saw patient on 27 and stated that she will most likely need a cath in the future after she recovers from an ulcer but that invasive evaluation could not be done right now as there be need for anticoagulation with any intervention. An echocardiogram was done and the results as above with EF 55%-I discussed patient with Dr. Ladona Ridgel on call for cardiology today and from his standpoint okay to discharge patient back to rehabilitation facility and she is to followup with Dr. Jens Som in one to 2 weeks.  Reflux esophagitis -PPI as above.  Stricture and stenosis of esophagus -Patient tolerating solids well, and is to follow up with her primary gastroenterologist in [place]. Active Problems:  HTN (hypertension) -She was maintained on her outpatient medications and is to continue them upon discharge.  HLD (hyperlipidemia) -She is to continue her outpatient medications. History of the left shoulder surgery -Patient to return to [facility] rehabilitation upon discharge    Discharge Vitals:  BP 145/78  Pulse 77  Temp(Src) 98.8 F (37.1 C) (Oral)  Resp 18  Ht 4\' 10"  (1.473 m)  Wt 64.139 kg (141 lb 6.4 oz)  BMI 29.55 kg/m2  SpO2 94%  Discharge Labs:  Results for orders placed during the hospital encounter of 05/22/11 (from the past 24 hour(s))  CBC     Status: Abnormal   Collection Time   05/25/11  5:00 AM      Component Value Range   WBC 4.6  4.0 - 10.5 (K/uL)   RBC 2.72 (*) 3.87 - 5.11 (MIL/uL)   Hemoglobin 8.8 (*) 12.0 - 15.0 (g/dL)   HCT 54.0 (*) 98.1 - 46.0 (%)   MCV 97.4  78.0 - 100.0 (fL)   MCH 32.4  26.0 - 34.0 (pg)   MCHC 33.2  30.0 - 36.0 (g/dL)   RDW 19.1 (*) 47.8 - 15.5 (%)   Platelets 162  150 - 400 (K/uL)  BASIC METABOLIC PANEL     Status: Abnormal   Collection Time   05/25/11 10:00 AM      Component Value Range   Sodium 140  135 - 145 (mEq/L)   Potassium 3.6  3.5 - 5.1 (mEq/L)   Chloride 106  96 -  112 (mEq/L)   CO2 26  19 - 32 (mEq/L)  Glucose, Bld 88  70 - 99 (mg/dL)   BUN 5 (*) 6 - 23 (mg/dL)   Creatinine, Ser 1.61  0.50 - 1.10 (mg/dL)   Calcium 8.9  8.4 - 09.6 (mg/dL)   GFR calc non Af Amer 77 (*) >90 (mL/min)   GFR calc Af Amer 90 (*) >90 (mL/min)    Signed: Donnalee Curry C 05/25/2011, 1:25 PM

## 2011-05-25 NOTE — Progress Notes (Signed)
Pt dc to [facility] fro rehab, pt stable for dc, family to transport pt.

## 2011-05-27 ENCOUNTER — Encounter (HOSPITAL_COMMUNITY): Payer: Self-pay | Admitting: Internal Medicine

## 2011-05-30 DIAGNOSIS — G8918 Other acute postprocedural pain: Secondary | ICD-10-CM | POA: Diagnosis not present

## 2011-05-30 DIAGNOSIS — Z96619 Presence of unspecified artificial shoulder joint: Secondary | ICD-10-CM | POA: Diagnosis not present

## 2011-05-30 DIAGNOSIS — K219 Gastro-esophageal reflux disease without esophagitis: Secondary | ICD-10-CM | POA: Diagnosis not present

## 2011-05-30 DIAGNOSIS — I130 Hypertensive heart and chronic kidney disease with heart failure and stage 1 through stage 4 chronic kidney disease, or unspecified chronic kidney disease: Secondary | ICD-10-CM | POA: Diagnosis not present

## 2011-05-30 DIAGNOSIS — I2510 Atherosclerotic heart disease of native coronary artery without angina pectoris: Secondary | ICD-10-CM | POA: Diagnosis not present

## 2011-05-30 DIAGNOSIS — K922 Gastrointestinal hemorrhage, unspecified: Secondary | ICD-10-CM | POA: Diagnosis not present

## 2011-05-30 DIAGNOSIS — D62 Acute posthemorrhagic anemia: Secondary | ICD-10-CM | POA: Diagnosis not present

## 2011-06-10 DIAGNOSIS — M19219 Secondary osteoarthritis, unspecified shoulder: Secondary | ICD-10-CM | POA: Diagnosis not present

## 2011-06-10 DIAGNOSIS — I739 Peripheral vascular disease, unspecified: Secondary | ICD-10-CM | POA: Diagnosis not present

## 2011-06-10 DIAGNOSIS — IMO0001 Reserved for inherently not codable concepts without codable children: Secondary | ICD-10-CM | POA: Diagnosis not present

## 2011-06-10 DIAGNOSIS — Z96619 Presence of unspecified artificial shoulder joint: Secondary | ICD-10-CM | POA: Diagnosis not present

## 2011-06-11 ENCOUNTER — Encounter: Payer: Self-pay | Admitting: Cardiology

## 2011-06-12 DIAGNOSIS — I739 Peripheral vascular disease, unspecified: Secondary | ICD-10-CM | POA: Diagnosis not present

## 2011-06-12 DIAGNOSIS — D649 Anemia, unspecified: Secondary | ICD-10-CM | POA: Diagnosis not present

## 2011-06-12 DIAGNOSIS — IMO0001 Reserved for inherently not codable concepts without codable children: Secondary | ICD-10-CM | POA: Diagnosis not present

## 2011-06-12 DIAGNOSIS — M19219 Secondary osteoarthritis, unspecified shoulder: Secondary | ICD-10-CM | POA: Diagnosis not present

## 2011-06-12 DIAGNOSIS — Z96619 Presence of unspecified artificial shoulder joint: Secondary | ICD-10-CM | POA: Diagnosis not present

## 2011-06-13 ENCOUNTER — Encounter: Payer: Self-pay | Admitting: Cardiology

## 2011-06-13 ENCOUNTER — Ambulatory Visit (INDEPENDENT_AMBULATORY_CARE_PROVIDER_SITE_OTHER): Payer: Medicare Other | Admitting: Cardiology

## 2011-06-13 VITALS — BP 154/79 | HR 66 | Wt 129.0 lb

## 2011-06-13 DIAGNOSIS — E785 Hyperlipidemia, unspecified: Secondary | ICD-10-CM | POA: Diagnosis not present

## 2011-06-13 DIAGNOSIS — I214 Non-ST elevation (NSTEMI) myocardial infarction: Secondary | ICD-10-CM

## 2011-06-13 DIAGNOSIS — I10 Essential (primary) hypertension: Secondary | ICD-10-CM

## 2011-06-13 NOTE — Assessment & Plan Note (Signed)
Continue statin. 

## 2011-06-13 NOTE — Assessment & Plan Note (Signed)
Patient with abnormal cardiac markers at the time of her previous GI bleed. She is not having symptoms. This most likely represents demand ischemia from anemia superimposed on underlying coronary disease. Her LV function is normal. Continue statin and beta blocker. She is scheduled to followup with her gastroenterologist in the future. I would like suggestions concerning when she can begin antiplatelet therapy. When aspirin and anticoagulation can be initiated I think we should proceed with cardiac catheterization for definitive evaluation. The risks and benefits were discussed and the patient agrees to proceed. She will follow up with me in 4-6 weeks for final timeline.

## 2011-06-13 NOTE — Patient Instructions (Signed)
Your physician recommends that you schedule a follow-up appointment in: 4-6 WEEKS  

## 2011-06-13 NOTE — Assessment & Plan Note (Addendum)
Blood pressure mildly elevated. We will follow and adjust regimen as indicated.

## 2011-06-13 NOTE — Progress Notes (Signed)
HPI: Pleasant female previously followed by Dr Kirke Corin for fu of hypertension. She is known to have left bundle branch block. Patient had a Myoview in August of 2012. This showed an ejection fraction of 69% and normal perfusion. Patient had left shoulder surgery in February 2013. She then developed a GI bleed and was readmitted in March. EGD revealed an ulcer and esophagitis. Cardiac markers were checked and her troponin was elevated. An echocardiogram showed an ejection fraction of 55%. There was mild hypokinesis of the inferior and inferoseptal myocardium. There was trace aortic insufficiency. There was mild to moderate mitral regurgitation. The left atrium was mildly dilated there was mild tricuspid regurgitation. It was felt she may require cardiac catheterization but only after she recovered from her GI bleed. Since she was dced, she denies dyspnea, chest pain or syncope. She's had no bleeding. She is being seen in [place] by gastroenterology.   Current Outpatient Prescriptions  Medication Sig Dispense Refill  . ALPRAZolam (XANAX) 0.5 MG tablet Take 0.5 mg by mouth 2 (two) times daily.       Marland Kitchen amLODipine (NORVASC) 2.5 MG tablet Take 2.5 mg by mouth daily.      . Ascorbic Acid (VITAMIN C) 1000 MG tablet Take 1,000 mg by mouth daily.        . Cholecalciferol (VITAMIN D3) 1000 UNITS tablet Take 1,000 Units by mouth daily.        . fish oil-omega-3 fatty acids 1000 MG capsule Take 2 g by mouth daily.       Marland Kitchen omeprazole (PRILOSEC) 20 MG capsule Take 20 mg by mouth daily.      . propranolol (INDERAL) 10 MG tablet Take 10 mg by mouth daily.      Marland Kitchen pyridOXINE (VITAMIN B-6) 100 MG tablet Take 100 mg by mouth daily.      . rosuvastatin (CRESTOR) 10 MG tablet Take 10 mg by mouth at bedtime.       . valsartan (DIOVAN) 320 MG tablet Take 320 mg by mouth daily.        Marland Kitchen DISCONTD: zolpidem (AMBIEN) 5 MG tablet Take 1 tablet (5 mg total) by mouth at bedtime as needed and may repeat dose one time if needed for  sleep.  30 tablet  1     Past Medical History  Diagnosis Date  . Goiter   . Pulmonary embolism 8/ 11  . Anxiety   . GERD (gastroesophageal reflux disease)   . Depressive disorder   . Osteoporosis   . Carpal tunnel syndrome   . HTN (hypertension)   . HLD (hyperlipidemia)   . Bladder polyp     bladder lesions  . Pneumonia     pleursy  . LBBB (left bundle branch block)   . Colon polyps     last colonoscopy when pt was around 79. No polyps reported but she did previously have small polyps  . Hiatal hernia   . Dysphagia     patient has undergone upper endoscopy with esophageal dilatation probably  around the turn of the Century for further back.    Past Surgical History  Procedure Date  . Carpal tunnel release     x2 ( l & 1R)  . Tubal ligation   . Cholecystectomy   . Total knee arthroplasty 05,09    bilateral  . Total hip arthroplasty 05    right  . Cataract extraction     bil  . Appendectomy   . Knee arthroscopy 05  rt  . Total shoulder arthroplasty 05/09/2011    Procedure: TOTAL SHOULDER ARTHROPLASTY;  Surgeon: Senaida Lange, MD;  Location: [facility] OR;  Service: Orthopedics;  Laterality: Left;  Left Shoulder Total Arthroplasty   . Esophagogastroduodenoscopy 05/23/2011    Procedure: ESOPHAGOGASTRODUODENOSCOPY (EGD);  Surgeon: Hilarie Fredrickson, MD;  Location: [facility] ENDOSCOPY;  Service: Endoscopy;  Laterality: N/A;    History   Social History  . Marital Status: Widowed    Spouse Name: N/A    Number of Children: 1  . Years of Education: N/A   Occupational History  . retired    Social History Main Topics  . Smoking status: Never Smoker   . Smokeless tobacco: Not on file  . Alcohol Use: No  . Drug Use: No  . Sexually Active: Yes    Birth Control/ Protection: Post-menopausal   Other Topics Concern  . Not on file   Social History Narrative  . No narrative on file    ROS: Some left shoulder pain but no fevers or chills, productive cough, hemoptysis, dysphasia,  odynophagia, melena, hematochezia, dysuria, hematuria, rash, seizure activity, orthopnea, PND, pedal edema, claudication. Remaining systems are negative.  Physical Exam: Well-developed well-nourished in no acute distress.  Skin is warm and dry.  HEENT is normal.  Neck is supple.  Chest is clear to auscultation with normal expansion.  Cardiovascular exam is regular rate and rhythm.  Abdominal exam nontender or distended. No masses palpated. Extremities show no edema. Left shoulder in sling. neuro grossly intact

## 2011-06-17 DIAGNOSIS — Z96619 Presence of unspecified artificial shoulder joint: Secondary | ICD-10-CM | POA: Diagnosis not present

## 2011-06-17 DIAGNOSIS — I739 Peripheral vascular disease, unspecified: Secondary | ICD-10-CM | POA: Diagnosis not present

## 2011-06-17 DIAGNOSIS — M19219 Secondary osteoarthritis, unspecified shoulder: Secondary | ICD-10-CM | POA: Diagnosis not present

## 2011-06-17 DIAGNOSIS — IMO0001 Reserved for inherently not codable concepts without codable children: Secondary | ICD-10-CM | POA: Diagnosis not present

## 2011-06-19 DIAGNOSIS — M19219 Secondary osteoarthritis, unspecified shoulder: Secondary | ICD-10-CM | POA: Diagnosis not present

## 2011-06-19 DIAGNOSIS — I739 Peripheral vascular disease, unspecified: Secondary | ICD-10-CM | POA: Diagnosis not present

## 2011-06-19 DIAGNOSIS — IMO0001 Reserved for inherently not codable concepts without codable children: Secondary | ICD-10-CM | POA: Diagnosis not present

## 2011-06-19 DIAGNOSIS — Z96619 Presence of unspecified artificial shoulder joint: Secondary | ICD-10-CM | POA: Diagnosis not present

## 2011-06-21 ENCOUNTER — Encounter: Payer: Self-pay | Admitting: Cardiology

## 2011-06-21 DIAGNOSIS — D62 Acute posthemorrhagic anemia: Secondary | ICD-10-CM | POA: Diagnosis not present

## 2011-06-21 DIAGNOSIS — K250 Acute gastric ulcer with hemorrhage: Secondary | ICD-10-CM | POA: Diagnosis not present

## 2011-06-21 DIAGNOSIS — Z96619 Presence of unspecified artificial shoulder joint: Secondary | ICD-10-CM | POA: Diagnosis not present

## 2011-06-21 DIAGNOSIS — IMO0001 Reserved for inherently not codable concepts without codable children: Secondary | ICD-10-CM | POA: Diagnosis not present

## 2011-06-21 DIAGNOSIS — M19219 Secondary osteoarthritis, unspecified shoulder: Secondary | ICD-10-CM | POA: Diagnosis not present

## 2011-06-21 DIAGNOSIS — K922 Gastrointestinal hemorrhage, unspecified: Secondary | ICD-10-CM | POA: Diagnosis not present

## 2011-06-21 DIAGNOSIS — Z6826 Body mass index (BMI) 26.0-26.9, adult: Secondary | ICD-10-CM | POA: Diagnosis not present

## 2011-06-21 DIAGNOSIS — I739 Peripheral vascular disease, unspecified: Secondary | ICD-10-CM | POA: Diagnosis not present

## 2011-06-24 DIAGNOSIS — Z96619 Presence of unspecified artificial shoulder joint: Secondary | ICD-10-CM | POA: Diagnosis not present

## 2011-06-24 DIAGNOSIS — I739 Peripheral vascular disease, unspecified: Secondary | ICD-10-CM | POA: Diagnosis not present

## 2011-06-24 DIAGNOSIS — M19219 Secondary osteoarthritis, unspecified shoulder: Secondary | ICD-10-CM | POA: Diagnosis not present

## 2011-06-24 DIAGNOSIS — IMO0001 Reserved for inherently not codable concepts without codable children: Secondary | ICD-10-CM | POA: Diagnosis not present

## 2011-06-25 DIAGNOSIS — M19219 Secondary osteoarthritis, unspecified shoulder: Secondary | ICD-10-CM | POA: Diagnosis not present

## 2011-06-25 DIAGNOSIS — IMO0001 Reserved for inherently not codable concepts without codable children: Secondary | ICD-10-CM | POA: Diagnosis not present

## 2011-06-25 DIAGNOSIS — I739 Peripheral vascular disease, unspecified: Secondary | ICD-10-CM | POA: Diagnosis not present

## 2011-06-25 DIAGNOSIS — Z96619 Presence of unspecified artificial shoulder joint: Secondary | ICD-10-CM | POA: Diagnosis not present

## 2011-06-27 DIAGNOSIS — I739 Peripheral vascular disease, unspecified: Secondary | ICD-10-CM | POA: Diagnosis not present

## 2011-06-27 DIAGNOSIS — Z96619 Presence of unspecified artificial shoulder joint: Secondary | ICD-10-CM | POA: Diagnosis not present

## 2011-06-27 DIAGNOSIS — IMO0001 Reserved for inherently not codable concepts without codable children: Secondary | ICD-10-CM | POA: Diagnosis not present

## 2011-06-27 DIAGNOSIS — M19219 Secondary osteoarthritis, unspecified shoulder: Secondary | ICD-10-CM | POA: Diagnosis not present

## 2011-06-28 DIAGNOSIS — Z96619 Presence of unspecified artificial shoulder joint: Secondary | ICD-10-CM | POA: Diagnosis not present

## 2011-06-28 DIAGNOSIS — M19219 Secondary osteoarthritis, unspecified shoulder: Secondary | ICD-10-CM | POA: Diagnosis not present

## 2011-06-28 DIAGNOSIS — I739 Peripheral vascular disease, unspecified: Secondary | ICD-10-CM | POA: Diagnosis not present

## 2011-06-28 DIAGNOSIS — IMO0001 Reserved for inherently not codable concepts without codable children: Secondary | ICD-10-CM | POA: Diagnosis not present

## 2011-07-15 DIAGNOSIS — M25519 Pain in unspecified shoulder: Secondary | ICD-10-CM | POA: Diagnosis not present

## 2011-07-17 DIAGNOSIS — M25519 Pain in unspecified shoulder: Secondary | ICD-10-CM | POA: Diagnosis not present

## 2011-07-19 DIAGNOSIS — D649 Anemia, unspecified: Secondary | ICD-10-CM | POA: Diagnosis not present

## 2011-07-22 DIAGNOSIS — M25519 Pain in unspecified shoulder: Secondary | ICD-10-CM | POA: Diagnosis not present

## 2011-07-22 DIAGNOSIS — D649 Anemia, unspecified: Secondary | ICD-10-CM | POA: Diagnosis not present

## 2011-07-23 ENCOUNTER — Ambulatory Visit (INDEPENDENT_AMBULATORY_CARE_PROVIDER_SITE_OTHER): Payer: Medicare Other | Admitting: Cardiology

## 2011-07-23 ENCOUNTER — Encounter: Payer: Self-pay | Admitting: Cardiology

## 2011-07-23 VITALS — BP 168/98 | HR 69 | Wt 127.0 lb

## 2011-07-23 DIAGNOSIS — I2510 Atherosclerotic heart disease of native coronary artery without angina pectoris: Secondary | ICD-10-CM | POA: Diagnosis not present

## 2011-07-23 DIAGNOSIS — I214 Non-ST elevation (NSTEMI) myocardial infarction: Secondary | ICD-10-CM

## 2011-07-23 DIAGNOSIS — E785 Hyperlipidemia, unspecified: Secondary | ICD-10-CM | POA: Diagnosis not present

## 2011-07-23 DIAGNOSIS — I10 Essential (primary) hypertension: Secondary | ICD-10-CM | POA: Diagnosis not present

## 2011-07-23 NOTE — Assessment & Plan Note (Signed)
Patient's blood pressure is mildly elevated. She follows this at home and it is typically controlled. Continue present medications and follow. We will increase medications if needed.

## 2011-07-23 NOTE — Progress Notes (Signed)
HPI: Pleasant female for fu of CAD. She is known to have left bundle branch block. Patient had a Myoview in August of 2012. This showed an ejection fraction of 69% and normal perfusion. Patient had left shoulder surgery in February 2013. She then developed a GI bleed and was readmitted in March. EGD revealed an ulcer and esophagitis. Cardiac markers were checked and her troponin was elevated. An echocardiogram showed an ejection fraction of 55%. There was mild hypokinesis of the inferior and inferoseptal myocardium. There was trace aortic insufficiency. There was mild to moderate mitral regurgitation. The left atrium was mildly dilated there was mild tricuspid regurgitation. It was felt she may require cardiac catheterization but only after she recovered from her GI bleed. I last saw her in March of 2013. Once she is cleared by GI to begin anticoagulation we felt we would begin aspirin and proceed with cardiac catheterization. Since I last saw her she denies dyspnea on exertion, orthopnea, PND, pedal edema, syncope or chest pain.   Current Outpatient Prescriptions  Medication Sig Dispense Refill  . ALPRAZolam (XANAX) 0.5 MG tablet Take 0.5 mg by mouth 2 (two) times daily.       Marland Kitchen amLODipine (NORVASC) 2.5 MG tablet Take 2.5 mg by mouth daily.      . Ascorbic Acid (VITAMIN C) 1000 MG tablet Take 1,000 mg by mouth daily.        Marland Kitchen aspirin 81 MG tablet Take 81 mg by mouth daily.      . Cholecalciferol (VITAMIN D3) 1000 UNITS tablet Take 1,000 Units by mouth daily.        . fish oil-omega-3 fatty acids 1000 MG capsule Take 2 g by mouth daily.       Marland Kitchen omeprazole (PRILOSEC) 20 MG capsule Take 20 mg by mouth daily.      . propranolol (INDERAL) 10 MG tablet Take 5 mg by mouth daily.       Marland Kitchen pyridOXINE (VITAMIN B-6) 100 MG tablet Take 100 mg by mouth daily.      . rosuvastatin (CRESTOR) 10 MG tablet Take 10 mg by mouth at bedtime.       . valsartan (DIOVAN) 320 MG tablet Take 320 mg by mouth daily.        Marland Kitchen  DISCONTD: zolpidem (AMBIEN) 5 MG tablet Take 1 tablet (5 mg total) by mouth at bedtime as needed and may repeat dose one time if needed for sleep.  30 tablet  1     Past Medical History  Diagnosis Date  . Goiter   . Pulmonary embolism 8/ 11  . Anxiety   . GERD (gastroesophageal reflux disease)   . Depressive disorder   . Osteoporosis   . Carpal tunnel syndrome   . HTN (hypertension)   . HLD (hyperlipidemia)   . Bladder polyp     bladder lesions  . Pneumonia     pleursy  . LBBB (left bundle branch block)   . Colon polyps     last colonoscopy when pt was around 79. No polyps reported but she did previously have small polyps  . Hiatal hernia   . Dysphagia     patient has undergone upper endoscopy with esophageal dilatation probably  around the turn of the Century for further back.    Past Surgical History  Procedure Date  . Carpal tunnel release     x2 ( l & 1R)  . Tubal ligation   . Cholecystectomy   . Total knee arthroplasty  05,09    bilateral  . Total hip arthroplasty 05    right  . Cataract extraction     bil  . Appendectomy   . Knee arthroscopy 05    rt  . Total shoulder arthroplasty 05/09/2011    Procedure: TOTAL SHOULDER ARTHROPLASTY;  Surgeon: Senaida Lange, MD;  Location: [facility] OR;  Service: Orthopedics;  Laterality: Left;  Left Shoulder Total Arthroplasty   . Esophagogastroduodenoscopy 05/23/2011    Procedure: ESOPHAGOGASTRODUODENOSCOPY (EGD);  Surgeon: Hilarie Fredrickson, MD;  Location: [facility] ENDOSCOPY;  Service: Endoscopy;  Laterality: N/A;    History   Social History  . Marital Status: Widowed    Spouse Name: N/A    Number of Children: 1  . Years of Education: N/A   Occupational History  . retired    Social History Main Topics  . Smoking status: Never Smoker   . Smokeless tobacco: Not on file  . Alcohol Use: No  . Drug Use: No  . Sexually Active: Yes    Birth Control/ Protection: Post-menopausal   Other Topics Concern  . Not on file   Social History  Narrative  . No narrative on file    ROS: no fevers or chills, productive cough, hemoptysis, dysphasia, odynophagia, melena, hematochezia, dysuria, hematuria, rash, seizure activity, orthopnea, PND, pedal edema, claudication. Remaining systems are negative.  Physical Exam: Well-developed well-nourished in no acute distress.  Skin is warm and dry.  HEENT is normal.  Neck is supple. No thyromegaly.  Chest is clear to auscultation with normal expansion.  Cardiovascular exam is regular rate and rhythm.  Abdominal exam nontender or distended. No masses palpated. Extremities show no edema. neuro grossly intact

## 2011-07-23 NOTE — Patient Instructions (Signed)
Your physician wants you to follow-up in: 6 MONTHS WITH DR CRENSHAW You will receive a reminder letter in the mail two months in advance. If you don't receive a letter, please call our office to schedule the follow-up appointment.   Your physician has requested that you have a lexiscan myoview. For further information please visit [email or web address]. Please follow instruction sheet, as given.   

## 2011-07-23 NOTE — Assessment & Plan Note (Signed)
Patient's previous MI occurred in the setting of GI bleed and anemia. Since then she has had absolutely no cardiac symptoms including no dyspnea and no chest pain. We will plan to proceed with a Myoview for risk stratification. If it is normal or low risk we will plan medical therapy. If it is moderate or high risk we will plan cardiac catheterization. This was discussed with the patient and she is in agreement. Continue aspirin, beta blocker and statin.

## 2011-07-23 NOTE — Assessment & Plan Note (Signed)
Continue statin. 

## 2011-07-24 DIAGNOSIS — M19019 Primary osteoarthritis, unspecified shoulder: Secondary | ICD-10-CM | POA: Diagnosis not present

## 2011-08-09 DIAGNOSIS — M25519 Pain in unspecified shoulder: Secondary | ICD-10-CM | POA: Diagnosis not present

## 2011-08-13 ENCOUNTER — Other Ambulatory Visit (HOSPITAL_COMMUNITY): Payer: Medicare Other

## 2011-08-20 ENCOUNTER — Ambulatory Visit (HOSPITAL_COMMUNITY): Payer: Medicare Other | Attending: Cardiology | Admitting: Radiology

## 2011-08-20 VITALS — BP 139/68 | Ht <= 58 in | Wt 127.0 lb

## 2011-08-20 DIAGNOSIS — R0609 Other forms of dyspnea: Secondary | ICD-10-CM | POA: Insufficient documentation

## 2011-08-20 DIAGNOSIS — I214 Non-ST elevation (NSTEMI) myocardial infarction: Secondary | ICD-10-CM

## 2011-08-20 DIAGNOSIS — R5381 Other malaise: Secondary | ICD-10-CM | POA: Diagnosis not present

## 2011-08-20 DIAGNOSIS — R0602 Shortness of breath: Secondary | ICD-10-CM | POA: Diagnosis not present

## 2011-08-20 DIAGNOSIS — R0989 Other specified symptoms and signs involving the circulatory and respiratory systems: Secondary | ICD-10-CM | POA: Insufficient documentation

## 2011-08-20 DIAGNOSIS — Z8249 Family history of ischemic heart disease and other diseases of the circulatory system: Secondary | ICD-10-CM | POA: Diagnosis not present

## 2011-08-20 DIAGNOSIS — I252 Old myocardial infarction: Secondary | ICD-10-CM | POA: Insufficient documentation

## 2011-08-20 DIAGNOSIS — E785 Hyperlipidemia, unspecified: Secondary | ICD-10-CM | POA: Diagnosis not present

## 2011-08-20 DIAGNOSIS — R112 Nausea with vomiting, unspecified: Secondary | ICD-10-CM | POA: Diagnosis not present

## 2011-08-20 DIAGNOSIS — I10 Essential (primary) hypertension: Secondary | ICD-10-CM | POA: Diagnosis not present

## 2011-08-20 DIAGNOSIS — I447 Left bundle-branch block, unspecified: Secondary | ICD-10-CM | POA: Diagnosis not present

## 2011-08-20 DIAGNOSIS — Z01810 Encounter for preprocedural cardiovascular examination: Secondary | ICD-10-CM

## 2011-08-20 DIAGNOSIS — I2510 Atherosclerotic heart disease of native coronary artery without angina pectoris: Secondary | ICD-10-CM

## 2011-08-20 MED ORDER — ADENOSINE (DIAGNOSTIC) 3 MG/ML IV SOLN
0.5600 mg/kg | Freq: Once | INTRAVENOUS | Status: AC
  Administered 2011-08-20: 32.4 mg via INTRAVENOUS

## 2011-08-20 MED ORDER — TECHNETIUM TC 99M TETROFOSMIN IV KIT
30.0000 | PACK | Freq: Once | INTRAVENOUS | Status: AC | PRN
  Administered 2011-08-20: 30 via INTRAVENOUS

## 2011-08-20 MED ORDER — TECHNETIUM TC 99M TETROFOSMIN IV KIT
10.0000 | PACK | Freq: Once | INTRAVENOUS | Status: AC | PRN
  Administered 2011-08-20: 10 via INTRAVENOUS

## 2011-08-20 NOTE — Progress Notes (Signed)
[facility] SITE 3 NUCLEAR MED [address] [place] [address] [phone]  Cardiology Nuclear Med Study  Christina [name] is a 78 y.o. female     MRN : [id number]     DOB: 1925/08/02  Procedure Date: 08/20/2011  Nuclear Med Background Indication for Stress Test:  Evaluation for Ischemia, and post op (+) troponins (NSTEMI) History:  8/12 Myocardial Perfusion Study-NL, EF=69%, 2/13 shoulder surgery with post op GI Bleed>(+) troponins (NSTEMI), and 3/13 Echo: EF=55%, Trace AI Cardiac Risk Factors: Family History - CAD, Hypertension, LBBB and Lipids  Symptoms:  DOE, Fatigue, Nausea and Vomiting   Nuclear Pre-Procedure Caffeine/Decaff Intake:  None NPO After: 7:00pm   Lungs:  clear O2 Sat: 97% on room air. IV 0.9% NS with Angio Cath:  20g  IV Site: R Antecubital  IV Started by:  Stanton Kidney, EMT-P  Chest Size (in):  36 Cup Size: B  Height: 4\' 10"  (1.473 m)  Weight:  127 lb (57.607 kg)  BMI:  Body mass index is 26.54 kg/(m^2). Tech Comments:  NA    Nuclear Med Study 1 or 2 day study: 1 day  Stress Test Type:  Adenosine  Reading MD: Charlton Haws, MD  Order Authorizing Provider:  Olga Millers, MD  Resting Radionuclide: Technetium 52m Tetrofosmin  Resting Radionuclide Dose: 11.0 mCi   Stress Radionuclide:  Technetium 38m Tetrofosmin  Stress Radionuclide Dose: 32.4 mCi           Stress Protocol Rest HR: 63 Stress HR: 81  Rest BP: 139/68 Stress BP: 150/65  Exercise Time (min): n/a METS: n/a   Predicted Max HR: 137 bpm % Max HR: 59.12 bpm Rate Pressure Product: 44010   Dose of Adenosine (mg):  32.3 Dose of Lexiscan: n/a mg  Dose of Atropine (mg): n/a Dose of Dobutamine: n/a mcg/kg/min (at max HR)  Stress Test Technologist: Irean Hong, RN  Nuclear Technologist:  Domenic Polite, CNMT     Rest Procedure:  Myocardial perfusion imaging was performed at rest 45 minutes following the intravenous administration of Technetium 65m Tetrofosmin. Rest ECG:  Sinus Bradycardia, LBBB, PVC  Stress Procedure:  The patient received IV adenosine at 140 mcg/kg/min for 4 minutes.  The EKG was nondiagnostic due to baseline LBBB. There  was a rare PAC. There was mild chest tightness with adenosine.  Technetium 20m Tetrofosmin was injected at the 2 minute mark and quantitative spect images were obtained after a 45 minute delay. Stress ECG: Uninteretable due to baseline LBBB  QPS Raw Data Images:  Normal; no motion artifact; normal heart/lung ratio. Stress Images:  Normal homogeneous uptake in all areas of the myocardium. Rest Images:  Normal homogeneous uptake in all areas of the myocardium. Subtraction (SDS):  Normal Transient Ischemic Dilatation (Normal <1.22):  1.07 Lung/Heart Ratio (Normal <0.45):  0.33  Quantitative Gated Spect Images QGS EDV:  61 ml QGS ESV:  18 ml  Impression Exercise Capacity:  Adenosine study with no exercise. BP Response:  Normal blood pressure response. Clinical Symptoms:  No chest pain. ECG Impression:  No significant ST segment change suggestive of ischemia. Comparison with Prior Nuclear Study: No images to compare  Overall Impression:  Normal stress nuclear study. Baseline LBBB on ECG  LV Ejection Fraction: 70%.  LV Wall Motion:  NL LV Function; NL Wall Motion     Charlton Haws

## 2011-08-23 ENCOUNTER — Telehealth: Payer: Self-pay | Admitting: Cardiology

## 2011-08-23 DIAGNOSIS — M19019 Primary osteoarthritis, unspecified shoulder: Secondary | ICD-10-CM | POA: Diagnosis not present

## 2011-08-23 NOTE — Telephone Encounter (Signed)
Pt called with normal stress test results

## 2011-08-23 NOTE — Telephone Encounter (Signed)
Fu call °Patient returning your call about test results °

## 2011-10-04 DIAGNOSIS — H35379 Puckering of macula, unspecified eye: Secondary | ICD-10-CM | POA: Diagnosis not present

## 2011-11-14 ENCOUNTER — Other Ambulatory Visit: Payer: Self-pay | Admitting: Cardiovascular Disease

## 2011-11-14 NOTE — Telephone Encounter (Signed)
Refilled amlodipine 

## 2011-11-25 ENCOUNTER — Encounter: Payer: Self-pay | Admitting: Cardiology

## 2011-11-25 DIAGNOSIS — E785 Hyperlipidemia, unspecified: Secondary | ICD-10-CM | POA: Diagnosis not present

## 2011-11-25 DIAGNOSIS — D649 Anemia, unspecified: Secondary | ICD-10-CM | POA: Diagnosis not present

## 2011-11-25 DIAGNOSIS — I10 Essential (primary) hypertension: Secondary | ICD-10-CM | POA: Diagnosis not present

## 2011-11-25 DIAGNOSIS — I2510 Atherosclerotic heart disease of native coronary artery without angina pectoris: Secondary | ICD-10-CM | POA: Diagnosis not present

## 2011-11-25 DIAGNOSIS — Z6827 Body mass index (BMI) 27.0-27.9, adult: Secondary | ICD-10-CM | POA: Diagnosis not present

## 2011-12-04 DIAGNOSIS — C670 Malignant neoplasm of trigone of bladder: Secondary | ICD-10-CM | POA: Diagnosis not present

## 2011-12-05 ENCOUNTER — Other Ambulatory Visit: Payer: Self-pay | Admitting: Urology

## 2011-12-11 ENCOUNTER — Encounter (HOSPITAL_BASED_OUTPATIENT_CLINIC_OR_DEPARTMENT_OTHER): Payer: Self-pay | Admitting: *Deleted

## 2011-12-12 ENCOUNTER — Encounter (HOSPITAL_BASED_OUTPATIENT_CLINIC_OR_DEPARTMENT_OTHER): Payer: Self-pay | Admitting: *Deleted

## 2011-12-12 NOTE — Progress Notes (Signed)
NPO AFTER MN. ARRIVES AT 0715. NEEDS ISTAT . CURRENT EKG AND CXR IN EPIC AND CHART. WILL TAKE NORVASC, XANAX, PRILOSEC, AND INDEROL AM OF SURG W/ SIPS OF WATER.

## 2011-12-16 ENCOUNTER — Ambulatory Visit (HOSPITAL_BASED_OUTPATIENT_CLINIC_OR_DEPARTMENT_OTHER)
Admission: RE | Admit: 2011-12-16 | Discharge: 2011-12-16 | Disposition: A | Discharge status: Home / Self Care | Payer: Medicare Other | Source: Ambulatory Visit | Attending: Urology | Admitting: Urology

## 2011-12-16 ENCOUNTER — Ambulatory Visit (HOSPITAL_BASED_OUTPATIENT_CLINIC_OR_DEPARTMENT_OTHER): Payer: Medicare Other | Admitting: Anesthesiology

## 2011-12-16 ENCOUNTER — Encounter (HOSPITAL_BASED_OUTPATIENT_CLINIC_OR_DEPARTMENT_OTHER): Payer: Self-pay | Admitting: *Deleted

## 2011-12-16 ENCOUNTER — Encounter (HOSPITAL_BASED_OUTPATIENT_CLINIC_OR_DEPARTMENT_OTHER)
Admission: RE | Disposition: A | Discharge status: Home / Self Care | Payer: Self-pay | Source: Ambulatory Visit | Attending: Urology

## 2011-12-16 ENCOUNTER — Encounter (HOSPITAL_BASED_OUTPATIENT_CLINIC_OR_DEPARTMENT_OTHER): Payer: Self-pay | Admitting: Anesthesiology

## 2011-12-16 DIAGNOSIS — M810 Age-related osteoporosis without current pathological fracture: Secondary | ICD-10-CM | POA: Diagnosis not present

## 2011-12-16 DIAGNOSIS — C679 Malignant neoplasm of bladder, unspecified: Secondary | ICD-10-CM | POA: Diagnosis not present

## 2011-12-16 DIAGNOSIS — I10 Essential (primary) hypertension: Secondary | ICD-10-CM | POA: Insufficient documentation

## 2011-12-16 DIAGNOSIS — C670 Malignant neoplasm of trigone of bladder: Secondary | ICD-10-CM | POA: Diagnosis not present

## 2011-12-16 DIAGNOSIS — N3080 Other cystitis without hematuria: Secondary | ICD-10-CM | POA: Diagnosis not present

## 2011-12-16 DIAGNOSIS — Z86711 Personal history of pulmonary embolism: Secondary | ICD-10-CM | POA: Insufficient documentation

## 2011-12-16 DIAGNOSIS — K219 Gastro-esophageal reflux disease without esophagitis: Secondary | ICD-10-CM | POA: Diagnosis not present

## 2011-12-16 DIAGNOSIS — I2510 Atherosclerotic heart disease of native coronary artery without angina pectoris: Secondary | ICD-10-CM | POA: Insufficient documentation

## 2011-12-16 HISTORY — DX: Personal history of other diseases of the digestive system: Z87.19

## 2011-12-16 HISTORY — DX: Anesthesia of skin: R20.0

## 2011-12-16 HISTORY — DX: Other specified postprocedural states: Z98.890

## 2011-12-16 HISTORY — DX: Old myocardial infarction: I25.2

## 2011-12-16 HISTORY — DX: Malignant neoplasm of bladder, unspecified: C67.9

## 2011-12-16 HISTORY — DX: Essential tremor: G25.0

## 2011-12-16 HISTORY — DX: Unspecified hearing loss, unspecified ear: H91.90

## 2011-12-16 HISTORY — DX: Unspecified osteoarthritis, unspecified site: M19.90

## 2011-12-16 HISTORY — DX: Carpal tunnel syndrome, bilateral upper limbs: G56.03

## 2011-12-16 HISTORY — DX: Atherosclerotic heart disease of native coronary artery without angina pectoris: I25.10

## 2011-12-16 HISTORY — DX: Stress incontinence (female) (male): N39.3

## 2011-12-16 HISTORY — DX: Personal history of pulmonary embolism: Z86.711

## 2011-12-16 HISTORY — PX: CYSTOSCOPY W/ RETROGRADES: SHX1426

## 2011-12-16 LAB — POCT I-STAT 4, (NA,K, GLUC, HGB,HCT)
Glucose, Bld: 84 mg/dL (ref 70–99)
Potassium: 3.6 mEq/L (ref 3.5–5.1)

## 2011-12-16 SURGERY — CYSTOSCOPY, WITH RETROGRADE PYELOGRAM
Anesthesia: General | Site: Bladder | Laterality: Bilateral | Wound class: Clean Contaminated

## 2011-12-16 MED ORDER — STERILE WATER FOR IRRIGATION IR SOLN
Status: DC | PRN
  Administered 2011-12-16: 3000 mL via INTRAVESICAL

## 2011-12-16 MED ORDER — IOHEXOL 350 MG/ML SOLN
INTRAVENOUS | Status: DC | PRN
  Administered 2011-12-16: 10 mL

## 2011-12-16 MED ORDER — URIBEL 118 MG PO CAPS: 1.0000 | ORAL_CAPSULE | Freq: Three times a day (TID) | ORAL | Status: DC | PRN

## 2011-12-16 MED ORDER — CEPHALEXIN 500 MG PO CAPS: 500.0000 mg | ORAL_CAPSULE | Freq: Two times a day (BID) | ORAL | Status: DC

## 2011-12-16 MED ORDER — PROPOFOL 10 MG/ML IV BOLUS
INTRAVENOUS | Status: DC | PRN
  Administered 2011-12-16: 120 mg via INTRAVENOUS

## 2011-12-16 MED ORDER — CIPROFLOXACIN IN D5W 400 MG/200ML IV SOLN
400.0000 mg | INTRAVENOUS | Status: AC
  Administered 2011-12-16: 400 mg via INTRAVENOUS

## 2011-12-16 MED ORDER — ONDANSETRON HCL 4 MG/2ML IJ SOLN
INTRAMUSCULAR | Status: DC | PRN
  Administered 2011-12-16: 4 mg via INTRAVENOUS

## 2011-12-16 MED ORDER — LACTATED RINGERS IV SOLN: INTRAVENOUS | Status: DC

## 2011-12-16 MED ORDER — LIDOCAINE HCL (CARDIAC) 20 MG/ML IV SOLN
INTRAVENOUS | Status: DC | PRN
  Administered 2011-12-16: 50 mg via INTRAVENOUS

## 2011-12-16 MED ORDER — LACTATED RINGERS IV SOLN
INTRAVENOUS | Status: DC
  Administered 2011-12-16: 100 mL/h via INTRAVENOUS

## 2011-12-16 MED ORDER — FENTANYL CITRATE 0.05 MG/ML IJ SOLN
INTRAMUSCULAR | Status: DC | PRN
  Administered 2011-12-16: 75 ug via INTRAVENOUS
  Administered 2011-12-16: 25 ug via INTRAVENOUS

## 2011-12-16 MED ORDER — FENTANYL CITRATE 0.05 MG/ML IJ SOLN: 25.0000 ug | INTRAMUSCULAR | Status: DC | PRN

## 2011-12-16 SURGICAL SUPPLY — 20 items
ADAPTER CATH URET PLST 4-6FR (CATHETERS) IMPLANT
ADPR CATH URET STRL DISP 4-6FR (CATHETERS)
BAG DRAIN URO-CYSTO SKYTR STRL (DRAIN) ×2 IMPLANT
BAG DRN UROCATH (DRAIN) ×1
CANISTER SUCT LVC 12 LTR MEDI- (MISCELLANEOUS) ×1 IMPLANT
CATH INTERMIT  6FR 70CM (CATHETERS) IMPLANT
CATH URET 5FR 28IN CONE TIP (BALLOONS)
CATH URET 5FR 28IN OPEN ENDED (CATHETERS) IMPLANT
CATH URET 5FR 70CM CONE TIP (BALLOONS) IMPLANT
CLOTH BEACON ORANGE TIMEOUT ST (SAFETY) ×2 IMPLANT
DRAPE CAMERA CLOSED 9X96 (DRAPES) ×2 IMPLANT
GLOVE BIO SURGEON STRL SZ8 (GLOVE) ×2 IMPLANT
GLOVE INDICATOR 6.5 STRL GRN (GLOVE) ×1 IMPLANT
GOWN PREVENTION PLUS LG XLONG (DISPOSABLE) ×2 IMPLANT
GOWN STRL REIN XL XLG (GOWN DISPOSABLE) ×2 IMPLANT
GUIDEWIRE 0.038 PTFE COATED (WIRE) IMPLANT
GUIDEWIRE ANG ZIPWIRE 038X150 (WIRE) IMPLANT
GUIDEWIRE STR DUAL SENSOR (WIRE) IMPLANT
NS IRRIG 500ML POUR BTL (IV SOLUTION) IMPLANT
PACK CYSTOSCOPY (CUSTOM PROCEDURE TRAY) ×2 IMPLANT

## 2011-12-16 NOTE — Op Note (Signed)
Preoperative diagnosis: Recurrent bladder cancer  Postoperative diagnosis: Same  Principal procedure: Cystoscopy, bilateral retrograde ureteropyelograms with interpretive fluoroscopy, bladder biopsy  Surgeon: Benjermin Korber  Anesthesia: Gen. with LMA  Complications: None  Specimen: Bladder biopsy/bladder tumor to pathology  Drains: None  Indications: 78 year-old female with urothelial carcinoma the bladder, having had 2-3 prior resections, both by Drs. Lorretta Harp and [place]. She recently was seen in the office with papillary tumor at her left ureteral orifice. Because of the proximity to her orifice, I did not feel it appropriate for in office cauterization. She presents at this time for cystoscopy, bilateral retrograde ureteropyelograms and bladder biopsy/TURBT. Risks  and complications have been discussed with the patient. She understands these and desires to proceed.  Description of procedure: The patient was properly identified in the holding area and received preoperative IV antibiotics. She was taken to the operating room where general anesthesia was administered with the LMA. She was placed in the dorsolithotomy, genitalia and perineum were prepped and draped. Timeout was then performed.  I then placed a 22 French panendoscope and totally evaluate the bladder. There were minimal trabeculations. There was scarring around the right ureteral orifice. There was a 1 cm area of flat but papillary-appearing urothelial abnormality at her left ureteral orifice. The orifice was identified. No other lesions were seen in her bladder. I then performed bilateral retrograde ureteropyelograms using a 6 [place] open-ended catheter.  On the right, the entire ureter was normal without filling defects or hydronephrosis. There were no strictures. Pyelocalyceal system on the right was normal, with an intrarenal pelvis. There were no filling defects or hydronephrosis. On the left, the ureter was normal, again  without filling defects, stricture or hydronephrosis. The renal pelvis and calyces filled easily, and were without filling defects or hydronephrosis.  This point, I left a 6 [place] open-ended catheter in the left ureter. I then biopsied the urothelium around the ureteral orifice, totally removing the abnormal appearing urothelium. This was sent as bladder tumor. I then used the Bugbee electrode to cauterize the area that was biopsied. No abnormal urothelium was left at this point.  The patient was awakened and taken to the PACU in stable condition after the scope was removed and the bladder drained.

## 2011-12-16 NOTE — Transfer of Care (Signed)
Immediate Anesthesia Transfer of Care Note  Patient: Christina [name]  Procedure(s) Performed: Procedure(s) (LRB): CYSTOSCOPY WITH RETROGRADE PYELOGRAM (Bilateral)  Patient Location: PACU  Anesthesia Type: General  Level of Consciousness: awake, oriented, sedated and patient cooperative  Airway & Oxygen Therapy: Patient Spontanous Breathing and Patient connected to face mask oxygen  Post-op Assessment: Report given to PACU RN and Post -op Vital signs reviewed and stable  Post vital signs: Reviewed and stable  Complications: No apparent anesthesia complications

## 2011-12-16 NOTE — Anesthesia Procedure Notes (Signed)
Procedure Name: LMA Insertion Date/Time: 12/16/2011 8:36 AM Performed by: Renella Cunas D Pre-anesthesia Checklist: Patient identified, Emergency Drugs available, Suction available and Patient being monitored Patient Re-evaluated:Patient Re-evaluated prior to inductionOxygen Delivery Method: Circle System Utilized Preoxygenation: Pre-oxygenation with 100% oxygen Intubation Type: IV induction Ventilation: Mask ventilation without difficulty LMA: LMA inserted LMA Size: 4.0 Number of attempts: 1 Airway Equipment and Method: bite block Placement Confirmation: positive ETCO2 Tube secured with: Tape Dental Injury: Teeth and Oropharynx as per pre-operative assessment

## 2011-12-16 NOTE — Anesthesia Postprocedure Evaluation (Addendum)
  Anesthesia Post-op Note  Patient: Christina [name]  Procedure(s) Performed: Procedure(s) (LRB): CYSTOSCOPY WITH RETROGRADE PYELOGRAM (Bilateral)  Patient Location: PACU  Anesthesia Type: General  Level of Consciousness: awake and alert   Airway and Oxygen Therapy: Patient Spontanous Breathing  Post-op Pain: mild  Post-op Assessment: Post-op Vital signs reviewed, Patient's Cardiovascular Status Stable, Respiratory Function Stable, Patent Airway and No signs of Nausea or vomiting  Post-op Vital Signs: stable  Complications: No apparent anesthesia complications

## 2011-12-16 NOTE — H&P (Signed)
H&P  Chief Complaint: Bladder tumor  History of Present Illness: Christina [name] is a 78 y.o. year old female presenting for repeat TUR-BT. She was recently seen on 12/04/2011 for surveillance cystoscopy and a recurrent bladder tumor was seen. Her history is as follows:  She underwent a TURBT in both 2004 and 2007 by Dr. Wanda Plump. Pathology revealed low-grade urothelial carcinoma of the bladder, stage Ta, papillary in nature. She underwent anesthetic cystoscopy and fulguration of a small papillary bladder tumor in 2010 by Dr. Vonita Moss. At that time, cystoscopy, left retrograde and ureteroscopy was performed.   Past Medical History  Diagnosis Date  . Anxiety   . GERD (gastroesophageal reflux disease)   . Depressive disorder   . Osteoporosis   . HTN (hypertension)   . HLD (hyperlipidemia)   . LBBB (left bundle branch block)   . Colon polyps     last colonoscopy when pt was around 79. No polyps reported but she did previously have small polyps  . PONV (postoperative nausea and vomiting)   . H/O hiatal hernia   . History of pulmonary embolism 2011  W/ THROMBOPHEBITIS-- RESOLVED  . History of GI bleed GASTRIC ULCER MARCH 2013--  BLOOD TRANSFUSION  . Benign head tremor   . Coronary artery disease CARDIOLOGIST- DR CRENSHAW-- LOV IN EPIC MAY 2013    MYOVIEW 08-20-2011--  NO ISCHEMIA-  IN EPIC  . History of non-ST elevation myocardial infarction (NSTEMI) MARCH 2013--   +TROPONINS/  GI BLEED     MEDICAL MANAGEMENT  . S/P dilatation of esophageal stricture   . Bladder cancer RECURRENT- HX TCC OF BLADDER  . OA (osteoarthritis) HANDS AND JOINTS  . Numbness of fingers   . Carpal tunnel syndrome on both sides   . SUI (stress urinary incontinence, female)   . Hearing loss bilateral hearing aids    Past Surgical History  Procedure Date  . Carpal tunnel release     BILATERAL  . Total knee arthroplasty 11-30-2007    LEFT KNEE  . Total hip arthroplasty 07-01-2003    RIGHT HIP  . Knee  arthroscopy 05    rt  . Total shoulder arthroplasty 05/09/2011    Procedure: TOTAL SHOULDER ARTHROPLASTY;  Surgeon: Senaida Lange, MD;  Location: [facility] OR;  Service: Orthopedics;  Laterality: Left;  Left Shoulder Total Arthroplasty   . Esophagogastroduodenoscopy 05/23/2011    Procedure: ESOPHAGOGASTRODUODENOSCOPY (EGD);  Surgeon: Hilarie Fredrickson, MD;  Location: [facility] ENDOSCOPY;  Service: Endoscopy;  Laterality: N/A;  . Cysto/ bilateral retrograde pyelogram/ fulgeration bladder tumor/ left ureteroscopy 02-28-2009  DR PETERSON  . Transurethral resection of bladder tumor 06-04-2005  &  11-09-2002    LOW GRADE NON-INVASIVE TRANSITIONAL CELL CARCINOMA  . Right total knee replacement 01-02-2004  . Laparoscopic cholecystectomy 1998  . Appendectomy 1970    AND TUBAL LIGATION  . Cataract extraction w/ intraocular lens  implant, bilateral   . Cardiovascular stress test 08-20-2011  DR CRENSHAW    NORMAL NUCLEAR STUDY (NO EXERCISE)/ BASELINE LBBB/ NO ISCHEMIA/ EF 70%/ LV WALL MOTION  AND FUNCTION NORMAL  . Transthoracic echocardiogram 05-24-2011    MILD LVH/ GRADE I DIASTOLIC DYSFUNTION/ EF 55%/ MILD HYPOKINESIS/ MILD TO MODERATE MITRAL REGURG./ MILD TRICUSPID REGURG./ MODERATE INCREASE SYSTOLIC PRESSURE, PULMONARY ARTERIES    Home Medications:  No prescriptions prior to admission    Allergies: No Known Allergies  Family History  Problem Relation Age of Onset  . Heart disease    . Hypertension    . Heart attack    .  Stroke      Social History:  reports that she has never smoked. She has never used smokeless tobacco. She reports that she does not drink alcohol or use illicit drugs.  ROS: A complete review of systems was performed.  All systems are negative except for pertinent findings as noted.  Physical Exam:  Vital signs in last 24 hours:   General:  Alert and oriented, No acute distress HEENT: Normocephalic, atraumatic Neck: No JVD or lymphadenopathy Cardiovascular: Regular rate and  rhythm Lungs: Clear bilaterally Abdomen: Soft, nontender, nondistended, no abdominal masses Back: No CVA tenderness Extremities: No edema Neurologic: Grossly intact  Laboratory Data:  No results found for this or any previous visit (from the past 24 hour(s)). No results found for this or any previous visit (from the past 240 hour(s)). Creatinine: No results found for this basename: CREATININE:7 in the last 168 hours  Radiologic Imaging: No results found.  Impression/Assessment:  Recurrent urothelial carcinoma of the bladder  Plan:  Cystoscopy, bilateral retrograde ureteropyelograms, TUR-BT  Chelsea Aus 12/16/2011, 6:36 AM  Bertram Millard. Lynora Dymond MD

## 2011-12-16 NOTE — Anesthesia Preprocedure Evaluation (Addendum)
Anesthesia Evaluation  Patient identified by MRN, date of birth, ID band Patient awake    Reviewed: Allergy & Precautions, H&P , NPO status , Patient's Chart, lab work & pertinent test results, reviewed documented beta blocker date and time   History of Anesthesia Complications (+) PONV  Airway Mallampati: II TM Distance: >3 FB Neck ROM: full    Dental No notable dental hx.    Pulmonary neg pulmonary ROS,  PE 2011 - resolved breath sounds clear to auscultation  Pulmonary exam normal       Cardiovascular Exercise Tolerance: Good hypertension, Pt. on medications and Pt. on home beta blockers + CAD and + Past MI negative cardio ROS  Rhythm:regular Rate:Normal  Non - ST elevation MI 3/13.  Medical management.  Myoview 08/20/11 non ischemia.  LBBB   Neuro/Psych Anxiety Depression negative neurological ROS  negative psych ROS   GI/Hepatic negative GI ROS, Neg liver ROS, hiatal hernia, PUD, GERD-  Medicated and Controlled,  Endo/Other  negative endocrine ROS  Renal/GU negative Renal ROS  negative genitourinary   Musculoskeletal   Abdominal   Peds  Hematology negative hematology ROS (+)   Anesthesia Other Findings   Reproductive/Obstetrics negative OB ROS                           Anesthesia Physical Anesthesia Plan  ASA: III  Anesthesia Plan: General   Post-op Pain Management:    Induction: Intravenous  Airway Management Planned: LMA  Additional Equipment:   Intra-op Plan:   Post-operative Plan:   Informed Consent: I have reviewed the patients History and Physical, chart, labs and discussed the procedure including the risks, benefits and alternatives for the proposed anesthesia with the patient or authorized representative who has indicated his/her understanding and acceptance.   Dental Advisory Given  Plan Discussed with: CRNA and Surgeon  Anesthesia Plan Comments:         Anesthesia Quick Evaluation

## 2011-12-17 ENCOUNTER — Encounter (HOSPITAL_BASED_OUTPATIENT_CLINIC_OR_DEPARTMENT_OTHER): Payer: Self-pay | Admitting: Urology

## 2012-01-13 ENCOUNTER — Other Ambulatory Visit: Payer: Self-pay | Admitting: Gynecology

## 2012-01-13 DIAGNOSIS — Z1231 Encounter for screening mammogram for malignant neoplasm of breast: Secondary | ICD-10-CM

## 2012-02-04 DIAGNOSIS — M79609 Pain in unspecified limb: Secondary | ICD-10-CM | POA: Diagnosis not present

## 2012-02-04 DIAGNOSIS — M899 Disorder of bone, unspecified: Secondary | ICD-10-CM | POA: Diagnosis not present

## 2012-02-04 DIAGNOSIS — M25559 Pain in unspecified hip: Secondary | ICD-10-CM | POA: Diagnosis not present

## 2012-02-04 DIAGNOSIS — M949 Disorder of cartilage, unspecified: Secondary | ICD-10-CM | POA: Diagnosis not present

## 2012-02-04 DIAGNOSIS — M7989 Other specified soft tissue disorders: Secondary | ICD-10-CM | POA: Diagnosis not present

## 2012-02-04 DIAGNOSIS — M25569 Pain in unspecified knee: Secondary | ICD-10-CM | POA: Diagnosis not present

## 2012-02-04 DIAGNOSIS — Z23 Encounter for immunization: Secondary | ICD-10-CM | POA: Diagnosis not present

## 2012-02-18 ENCOUNTER — Ambulatory Visit
Admission: RE | Admit: 2012-02-18 | Discharge: 2012-02-18 | Disposition: A | Discharge status: Home / Self Care | Payer: Medicare Other | Source: Ambulatory Visit | Attending: Gynecology | Admitting: Gynecology

## 2012-02-18 DIAGNOSIS — Z1231 Encounter for screening mammogram for malignant neoplasm of breast: Secondary | ICD-10-CM

## 2012-02-24 ENCOUNTER — Other Ambulatory Visit: Payer: Self-pay | Admitting: Gynecology

## 2012-02-24 DIAGNOSIS — M8580 Other specified disorders of bone density and structure, unspecified site: Secondary | ICD-10-CM

## 2012-02-24 DIAGNOSIS — N8184 Pelvic muscle wasting: Secondary | ICD-10-CM | POA: Diagnosis not present

## 2012-02-24 DIAGNOSIS — M1990 Unspecified osteoarthritis, unspecified site: Secondary | ICD-10-CM

## 2012-02-26 ENCOUNTER — Encounter: Payer: Self-pay | Admitting: Cardiology

## 2012-02-26 DIAGNOSIS — E785 Hyperlipidemia, unspecified: Secondary | ICD-10-CM | POA: Diagnosis not present

## 2012-02-26 DIAGNOSIS — Z6827 Body mass index (BMI) 27.0-27.9, adult: Secondary | ICD-10-CM | POA: Diagnosis not present

## 2012-02-26 DIAGNOSIS — I2510 Atherosclerotic heart disease of native coronary artery without angina pectoris: Secondary | ICD-10-CM | POA: Diagnosis not present

## 2012-02-26 DIAGNOSIS — D62 Acute posthemorrhagic anemia: Secondary | ICD-10-CM | POA: Diagnosis not present

## 2012-02-26 DIAGNOSIS — I10 Essential (primary) hypertension: Secondary | ICD-10-CM | POA: Diagnosis not present

## 2012-03-05 ENCOUNTER — Ambulatory Visit
Admission: RE | Admit: 2012-03-05 | Discharge: 2012-03-05 | Disposition: A | Discharge status: Home / Self Care | Payer: Medicare Other | Source: Ambulatory Visit | Attending: Gynecology | Admitting: Gynecology

## 2012-03-05 DIAGNOSIS — M899 Disorder of bone, unspecified: Secondary | ICD-10-CM | POA: Diagnosis not present

## 2012-03-05 DIAGNOSIS — M810 Age-related osteoporosis without current pathological fracture: Secondary | ICD-10-CM | POA: Diagnosis not present

## 2012-03-05 DIAGNOSIS — M8580 Other specified disorders of bone density and structure, unspecified site: Secondary | ICD-10-CM

## 2012-04-13 DIAGNOSIS — J019 Acute sinusitis, unspecified: Secondary | ICD-10-CM | POA: Diagnosis not present

## 2012-04-13 DIAGNOSIS — Z6827 Body mass index (BMI) 27.0-27.9, adult: Secondary | ICD-10-CM | POA: Diagnosis not present

## 2012-04-24 DIAGNOSIS — I10 Essential (primary) hypertension: Secondary | ICD-10-CM | POA: Diagnosis not present

## 2012-05-08 DIAGNOSIS — Z6826 Body mass index (BMI) 26.0-26.9, adult: Secondary | ICD-10-CM | POA: Diagnosis not present

## 2012-06-05 ENCOUNTER — Encounter: Payer: Self-pay | Admitting: Cardiology

## 2012-06-05 DIAGNOSIS — I10 Essential (primary) hypertension: Secondary | ICD-10-CM | POA: Diagnosis not present

## 2012-06-05 DIAGNOSIS — D62 Acute posthemorrhagic anemia: Secondary | ICD-10-CM | POA: Diagnosis not present

## 2012-06-05 DIAGNOSIS — I2510 Atherosclerotic heart disease of native coronary artery without angina pectoris: Secondary | ICD-10-CM | POA: Diagnosis not present

## 2012-06-05 DIAGNOSIS — E785 Hyperlipidemia, unspecified: Secondary | ICD-10-CM | POA: Diagnosis not present

## 2012-06-05 DIAGNOSIS — Z6827 Body mass index (BMI) 27.0-27.9, adult: Secondary | ICD-10-CM | POA: Diagnosis not present

## 2012-09-02 ENCOUNTER — Encounter: Payer: Self-pay | Admitting: Cardiology

## 2012-09-02 DIAGNOSIS — I10 Essential (primary) hypertension: Secondary | ICD-10-CM | POA: Diagnosis not present

## 2012-09-02 DIAGNOSIS — E785 Hyperlipidemia, unspecified: Secondary | ICD-10-CM | POA: Diagnosis not present

## 2012-09-02 DIAGNOSIS — I2510 Atherosclerotic heart disease of native coronary artery without angina pectoris: Secondary | ICD-10-CM | POA: Diagnosis not present

## 2012-09-02 DIAGNOSIS — D62 Acute posthemorrhagic anemia: Secondary | ICD-10-CM | POA: Diagnosis not present

## 2012-09-02 DIAGNOSIS — Z6827 Body mass index (BMI) 27.0-27.9, adult: Secondary | ICD-10-CM | POA: Diagnosis not present

## 2012-11-06 ENCOUNTER — Telehealth: Payer: Self-pay | Admitting: Cardiology

## 2012-11-06 NOTE — Telephone Encounter (Signed)
LMTCB

## 2012-11-06 NOTE — Telephone Encounter (Signed)
New Problem  Pt is having swelling in both feet.. Request to speak with a nurse.

## 2012-11-11 DIAGNOSIS — H04129 Dry eye syndrome of unspecified lacrimal gland: Secondary | ICD-10-CM | POA: Diagnosis not present

## 2012-11-11 NOTE — Telephone Encounter (Signed)
New problem ° ° °Pt returning your call. Please call pt. °

## 2012-11-11 NOTE — Telephone Encounter (Signed)
Spoke with pt, she does not have the swelling everyday. Dr Veatrice Kells had given the pt a script for furosemide 20 mg as needed for swelling and that has helped. She denies SOB. She is on amlodipine and understands that can be a side effect from that med. She is going to discuss with dr Jens Som at her follow up appt. She will call with problems prior to that appt.

## 2012-11-11 NOTE — Telephone Encounter (Signed)
Left message for pt to call.

## 2012-11-11 NOTE — Telephone Encounter (Signed)
New problem ° ° ° °Pt returning your phone call. Please call pt °

## 2012-12-08 ENCOUNTER — Ambulatory Visit: Payer: Medicare Other | Admitting: Cardiology

## 2012-12-16 ENCOUNTER — Telehealth: Payer: Self-pay | Admitting: Cardiology

## 2012-12-16 NOTE — Telephone Encounter (Signed)
New problem    Pt stated it has been a year since she has been in office and because she has tremors really bad, if there are any paperwork she need to fill out she would like to come pick them up. Please call pt concerning this matter.

## 2012-12-16 NOTE — Telephone Encounter (Signed)
Left message for pt , aware no paperwork prior to appt.

## 2012-12-23 ENCOUNTER — Encounter: Payer: Self-pay | Admitting: Cardiology

## 2012-12-23 ENCOUNTER — Ambulatory Visit (INDEPENDENT_AMBULATORY_CARE_PROVIDER_SITE_OTHER): Payer: Medicare Other | Admitting: Cardiology

## 2012-12-23 VITALS — BP 150/80 | HR 66 | Ht <= 58 in | Wt 132.1 lb

## 2012-12-23 DIAGNOSIS — I447 Left bundle-branch block, unspecified: Secondary | ICD-10-CM | POA: Diagnosis not present

## 2012-12-23 MED ORDER — HYDROCHLOROTHIAZIDE 12.5 MG PO CAPS: 12.5000 mg | ORAL_CAPSULE | Freq: Every day | ORAL | Status: DC

## 2012-12-23 NOTE — Assessment & Plan Note (Signed)
Patient's previous MI occurred in the setting of GI bleed and anemia. Since then she has had no chest pain. Previous Myoview was normal. Plan medical therapy. Continue aspirin, beta blocker and statin. 

## 2012-12-23 NOTE — Assessment & Plan Note (Signed)
Continue statin. Lipids and liver monitored by primary care. 

## 2012-12-23 NOTE — Progress Notes (Signed)
HPI: Pleasant female for fu of CAD. She is known to have left bundle branch block. Patient had left shoulder surgery in February 2013. She then developed a GI bleed and was readmitted in March. EGD revealed an ulcer and esophagitis. Cardiac markers were checked and her troponin was elevated. An echocardiogram showed an ejection fraction of 55%. There was mild hypokinesis of the inferior and inferoseptal myocardium. There was trace aortic insufficiency. There was mild to moderate mitral regurgitation. The left atrium was mildly dilated there was mild tricuspid regurgitation. After she recovered from her GI bleed a nuclear was performed in June of 2013 and this revealed an ejection fraction of 70% and normal perfusion. I last saw her in May of 2013. Since then, she has mild dyspnea on exertion but no orthopnea, PND, palpitations, syncope or chest pain. She has noticed recent increased pedal edema.   Current Outpatient Prescriptions  Medication Sig Dispense Refill  . ALPRAZolam (XANAX) 0.5 MG tablet Take 0.5 mg by mouth 2 (two) times daily.       Marland Kitchen amLODipine (NORVASC) 2.5 MG tablet       . Ascorbic Acid (VITAMIN C) 1000 MG tablet Take 1,000 mg by mouth daily.       Marland Kitchen aspirin 81 MG tablet Take 81 mg by mouth daily.      . cephALEXin (KEFLEX) 500 MG capsule Take 1 capsule (500 mg total) by mouth 2 (two) times daily.      . Cholecalciferol (VITAMIN D3) 1000 UNITS tablet Take 1,000 Units by mouth daily.        . fish oil-omega-3 fatty acids 1000 MG capsule Take 2 g by mouth daily.       Marland Kitchen ibandronate (BONIVA) 150 MG tablet Take 150 mg by mouth every 30 (thirty) days. Take in the morning with a full glass of water, on an empty stomach, and do not take anything else by mouth or lie down for the next 30 min.      . Melatonin 3 MG CAPS Take by mouth as needed.       . metoprolol tartrate (LOPRESSOR) 25 MG tablet Take 25 mg by mouth 2 (two) times daily.      Marland Kitchen omeprazole (PRILOSEC) 20 MG capsule Take  20 mg by mouth every morning.       . pyridOXINE (VITAMIN B-6) 100 MG tablet Take 100 mg by mouth daily.      . rosuvastatin (CRESTOR) 10 MG tablet Take 10 mg by mouth at bedtime.       . valsartan (DIOVAN) 320 MG tablet Take 320 mg by mouth every morning.       . [DISCONTINUED] zolpidem (AMBIEN) 5 MG tablet Take 1 tablet (5 mg total) by mouth at bedtime as needed and may repeat dose one time if needed for sleep.  30 tablet  1   No current facility-administered medications for this visit.     Past Medical History  Diagnosis Date  . Anxiety   . GERD (gastroesophageal reflux disease)   . Depressive disorder   . Osteoporosis   . HTN (hypertension)   . HLD (hyperlipidemia)   . LBBB (left bundle branch block)   . Colon polyps     last colonoscopy when pt was around 79. No polyps reported but she did previously have small polyps  . PONV (postoperative nausea and vomiting)   . H/O hiatal hernia   . History of pulmonary embolism 2011  W/ THROMBOPHEBITIS-- RESOLVED  .  History of GI bleed GASTRIC ULCER MARCH 2013--  BLOOD TRANSFUSION  . Benign head tremor   . Coronary artery disease CARDIOLOGIST- DR CRENSHAW-- LOV IN EPIC MAY 2013    MYOVIEW 08-20-2011--  NO ISCHEMIA-  IN EPIC  . History of non-ST elevation myocardial infarction (NSTEMI) MARCH 2013--   +TROPONINS/  GI BLEED     MEDICAL MANAGEMENT  . S/P dilatation of esophageal stricture   . Bladder cancer RECURRENT- HX TCC OF BLADDER  . OA (osteoarthritis) HANDS AND JOINTS  . Numbness of fingers   . Carpal tunnel syndrome on both sides   . SUI (stress urinary incontinence, female)   . Hearing loss bilateral hearing aids    Past Surgical History  Procedure Laterality Date  . Carpal tunnel release      BILATERAL  . Total knee arthroplasty  11-30-2007    LEFT KNEE  . Total hip arthroplasty  07-01-2003    RIGHT HIP  . Knee arthroscopy  05    rt  . Total shoulder arthroplasty  05/09/2011    Procedure: TOTAL SHOULDER ARTHROPLASTY;   Surgeon: Senaida Lange, MD;  Location: [facility] OR;  Service: Orthopedics;  Laterality: Left;  Left Shoulder Total Arthroplasty   . Esophagogastroduodenoscopy  05/23/2011    Procedure: ESOPHAGOGASTRODUODENOSCOPY (EGD);  Surgeon: Hilarie Fredrickson, MD;  Location: [facility] ENDOSCOPY;  Service: Endoscopy;  Laterality: N/A;  . Cysto/ bilateral retrograde pyelogram/ fulgeration bladder tumor/ left ureteroscopy  02-28-2009  DR PETERSON  . Transurethral resection of bladder tumor  06-04-2005  &  11-09-2002    LOW GRADE NON-INVASIVE TRANSITIONAL CELL CARCINOMA  . Right total knee replacement  01-02-2004  . Laparoscopic cholecystectomy  1998  . Appendectomy  1970    AND TUBAL LIGATION  . Cataract extraction w/ intraocular lens  implant, bilateral    . Cardiovascular stress test  08-20-2011  DR CRENSHAW    NORMAL NUCLEAR STUDY (NO EXERCISE)/ BASELINE LBBB/ NO ISCHEMIA/ EF 70%/ LV WALL MOTION  AND FUNCTION NORMAL  . Transthoracic echocardiogram  05-24-2011    MILD LVH/ GRADE I DIASTOLIC DYSFUNTION/ EF 55%/ MILD HYPOKINESIS/ MILD TO MODERATE MITRAL REGURG./ MILD TRICUSPID REGURG./ MODERATE INCREASE SYSTOLIC PRESSURE, PULMONARY ARTERIES  . Cystoscopy w/ retrogrades  12/16/2011    Procedure: CYSTOSCOPY WITH RETROGRADE PYELOGRAM;  Surgeon: Marcine Matar, MD;  Location: [facility];  Service: Urology;  Laterality: Bilateral;  45 mins requested for this case  C-ARM      History   Social History  . Marital Status: Widowed    Spouse Name: N/A    Number of Children: 1  . Years of Education: N/A   Occupational History  . retired    Social History Main Topics  . Smoking status: Never Smoker   . Smokeless tobacco: Never Used  . Alcohol Use: No  . Drug Use: No  . Sexual Activity: Not on file   Other Topics Concern  . Not on file   Social History Narrative  . No narrative on file    ROS: no fevers or chills, productive cough, hemoptysis, dysphasia, odynophagia, melena, hematochezia, dysuria,  hematuria, rash, seizure activity, orthopnea, PND, pedal edema, claudication. Remaining systems are negative.  Physical Exam: Well-developed well-nourished in no acute distress.  Skin is warm and dry.  HEENT is normal.  Neck is supple.  Chest is clear to auscultation with normal expansion.  Cardiovascular exam is regular rate and rhythm. 1/6 systolic murmur left sternal border. Abdominal exam nontender or distended. No masses palpated.  Extremities show trace edema. neuro grossly intact  ECG sinus rhythm with occasional PVCs. Left bundle branch block.

## 2012-12-23 NOTE — Patient Instructions (Signed)
Your physician wants you to follow-up in: ONE YEAR WITH DR Shelda Pal will receive a reminder letter in the mail two months in advance. If you don't receive a letter, please call our office to schedule the follow-up appointment.   STOP AMLODIPINE  START HCTZ 12.5 MG ONCE DAILY  Your physician recommends that you return for lab work in: ONE WEEK WITH DR Veatrice Kells

## 2012-12-23 NOTE — Assessment & Plan Note (Signed)
Patient has developed increased pedal edema. Question related amlodipine. I will discontinue this medication. Add HCTZ 12.5 mg daily. Check potassium and renal function in one week. Follow blood pressure and adjust medications as needed.

## 2012-12-30 ENCOUNTER — Other Ambulatory Visit: Payer: Self-pay | Admitting: Cardiology

## 2012-12-30 ENCOUNTER — Encounter: Payer: Self-pay | Admitting: Cardiology

## 2012-12-30 DIAGNOSIS — Z79899 Other long term (current) drug therapy: Secondary | ICD-10-CM | POA: Diagnosis not present

## 2012-12-30 DIAGNOSIS — I10 Essential (primary) hypertension: Secondary | ICD-10-CM | POA: Diagnosis not present

## 2012-12-31 LAB — BASIC METABOLIC PANEL
BUN/Creatinine Ratio: 19 (ref 11–26)
CO2: 26 mmol/L (ref 18–29)
Calcium: 9.9 mg/dL (ref 8.6–10.2)
Potassium: 4.6 mmol/L (ref 3.5–5.2)
Sodium: 140 mmol/L (ref 134–144)

## 2013-01-01 ENCOUNTER — Telehealth: Payer: Self-pay | Admitting: Cardiology

## 2013-01-01 NOTE — Telephone Encounter (Signed)
Pt aware of lab results 

## 2013-01-01 NOTE — Telephone Encounter (Signed)
New message    Want lab results from wed at [facility].  Orders were sent to Dr Tomasa Blase.  Dr Jens Som asked Dr Tomasa Blase to order this but dr Tomasa Blase no longer will do lab for other phy. However, [facility] did draw the blood.  They faxed the results here.

## 2013-01-05 ENCOUNTER — Telehealth: Payer: Self-pay | Admitting: Cardiology

## 2013-01-05 NOTE — Telephone Encounter (Signed)
Spoke with pt, aware of normal labs 

## 2013-01-05 NOTE — Telephone Encounter (Signed)
New message     Returned Debra's call---she was calling to give test results

## 2013-01-11 DIAGNOSIS — R195 Other fecal abnormalities: Secondary | ICD-10-CM | POA: Diagnosis not present

## 2013-01-12 DIAGNOSIS — R195 Other fecal abnormalities: Secondary | ICD-10-CM | POA: Diagnosis not present

## 2013-01-18 ENCOUNTER — Other Ambulatory Visit: Payer: Self-pay

## 2013-01-18 DIAGNOSIS — Z1231 Encounter for screening mammogram for malignant neoplasm of breast: Secondary | ICD-10-CM

## 2013-02-18 ENCOUNTER — Ambulatory Visit
Admission: RE | Admit: 2013-02-18 | Discharge: 2013-02-18 | Disposition: A | Discharge status: Home / Self Care | Payer: Medicare Other | Source: Ambulatory Visit

## 2013-02-18 DIAGNOSIS — Z1231 Encounter for screening mammogram for malignant neoplasm of breast: Secondary | ICD-10-CM | POA: Diagnosis not present

## 2013-02-19 DIAGNOSIS — Z6828 Body mass index (BMI) 28.0-28.9, adult: Secondary | ICD-10-CM | POA: Diagnosis not present

## 2013-02-19 DIAGNOSIS — S61209A Unspecified open wound of unspecified finger without damage to nail, initial encounter: Secondary | ICD-10-CM | POA: Diagnosis not present

## 2013-02-19 DIAGNOSIS — Z23 Encounter for immunization: Secondary | ICD-10-CM | POA: Diagnosis not present

## 2013-03-02 DIAGNOSIS — Z124 Encounter for screening for malignant neoplasm of cervix: Secondary | ICD-10-CM | POA: Diagnosis not present

## 2013-03-02 DIAGNOSIS — Z01419 Encounter for gynecological examination (general) (routine) without abnormal findings: Secondary | ICD-10-CM | POA: Diagnosis not present

## 2013-03-02 DIAGNOSIS — Z0000 Encounter for general adult medical examination without abnormal findings: Secondary | ICD-10-CM | POA: Diagnosis not present

## 2013-03-04 DIAGNOSIS — L659 Nonscarring hair loss, unspecified: Secondary | ICD-10-CM | POA: Diagnosis not present

## 2013-03-04 DIAGNOSIS — Z6827 Body mass index (BMI) 27.0-27.9, adult: Secondary | ICD-10-CM | POA: Diagnosis not present

## 2013-03-04 DIAGNOSIS — I2510 Atherosclerotic heart disease of native coronary artery without angina pectoris: Secondary | ICD-10-CM | POA: Diagnosis not present

## 2013-03-04 DIAGNOSIS — I10 Essential (primary) hypertension: Secondary | ICD-10-CM | POA: Diagnosis not present

## 2013-03-04 DIAGNOSIS — E785 Hyperlipidemia, unspecified: Secondary | ICD-10-CM | POA: Diagnosis not present

## 2013-03-04 DIAGNOSIS — D62 Acute posthemorrhagic anemia: Secondary | ICD-10-CM | POA: Diagnosis not present

## 2013-03-04 DIAGNOSIS — Z23 Encounter for immunization: Secondary | ICD-10-CM | POA: Diagnosis not present

## 2013-03-23 DIAGNOSIS — J111 Influenza due to unidentified influenza virus with other respiratory manifestations: Secondary | ICD-10-CM | POA: Diagnosis not present

## 2013-03-23 DIAGNOSIS — Z6827 Body mass index (BMI) 27.0-27.9, adult: Secondary | ICD-10-CM | POA: Diagnosis not present

## 2013-03-28 IMAGING — CR DG CHEST 2V
2 series · 2 of 2 positions shown · non-contrast
Comparison: 02/28/2009

CLINICAL DATA: Preop left shoulder replacement.  Hypertension

CHEST - 2 VIEW

[view not recorded (1 of 2)]
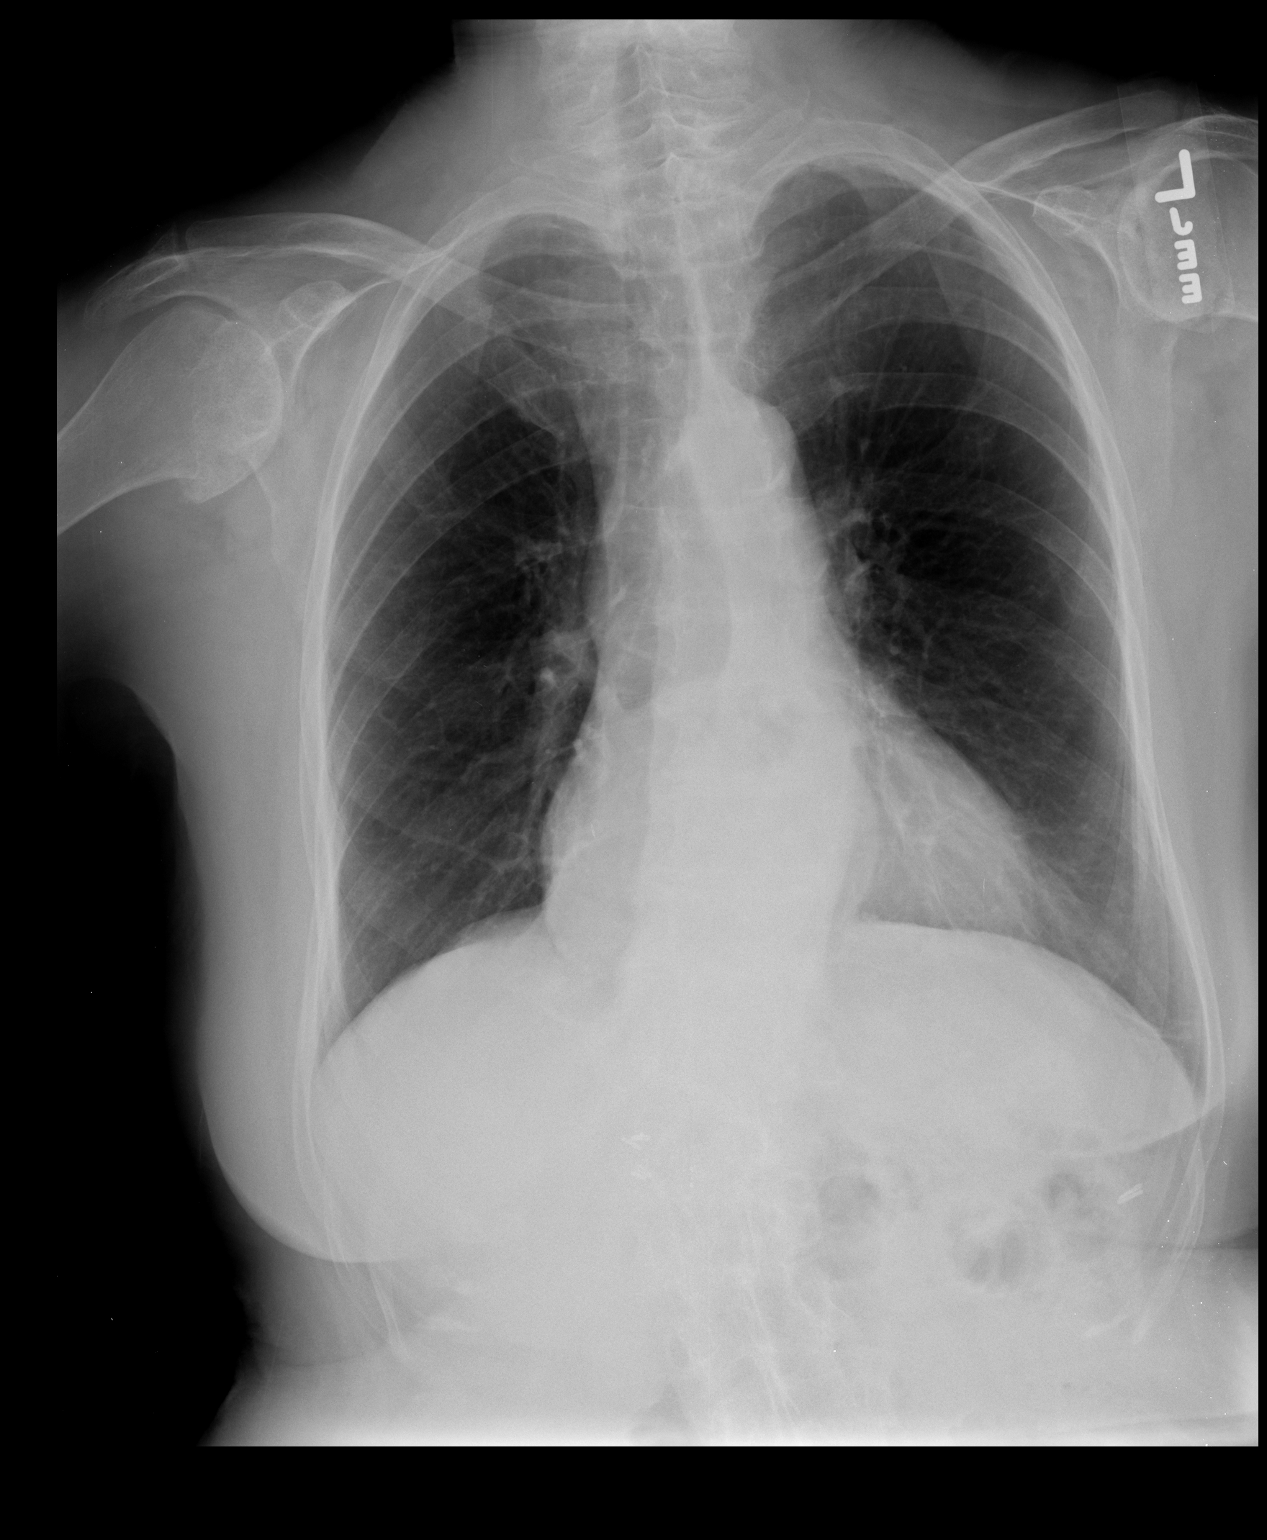

[view not recorded (2 of 2)]
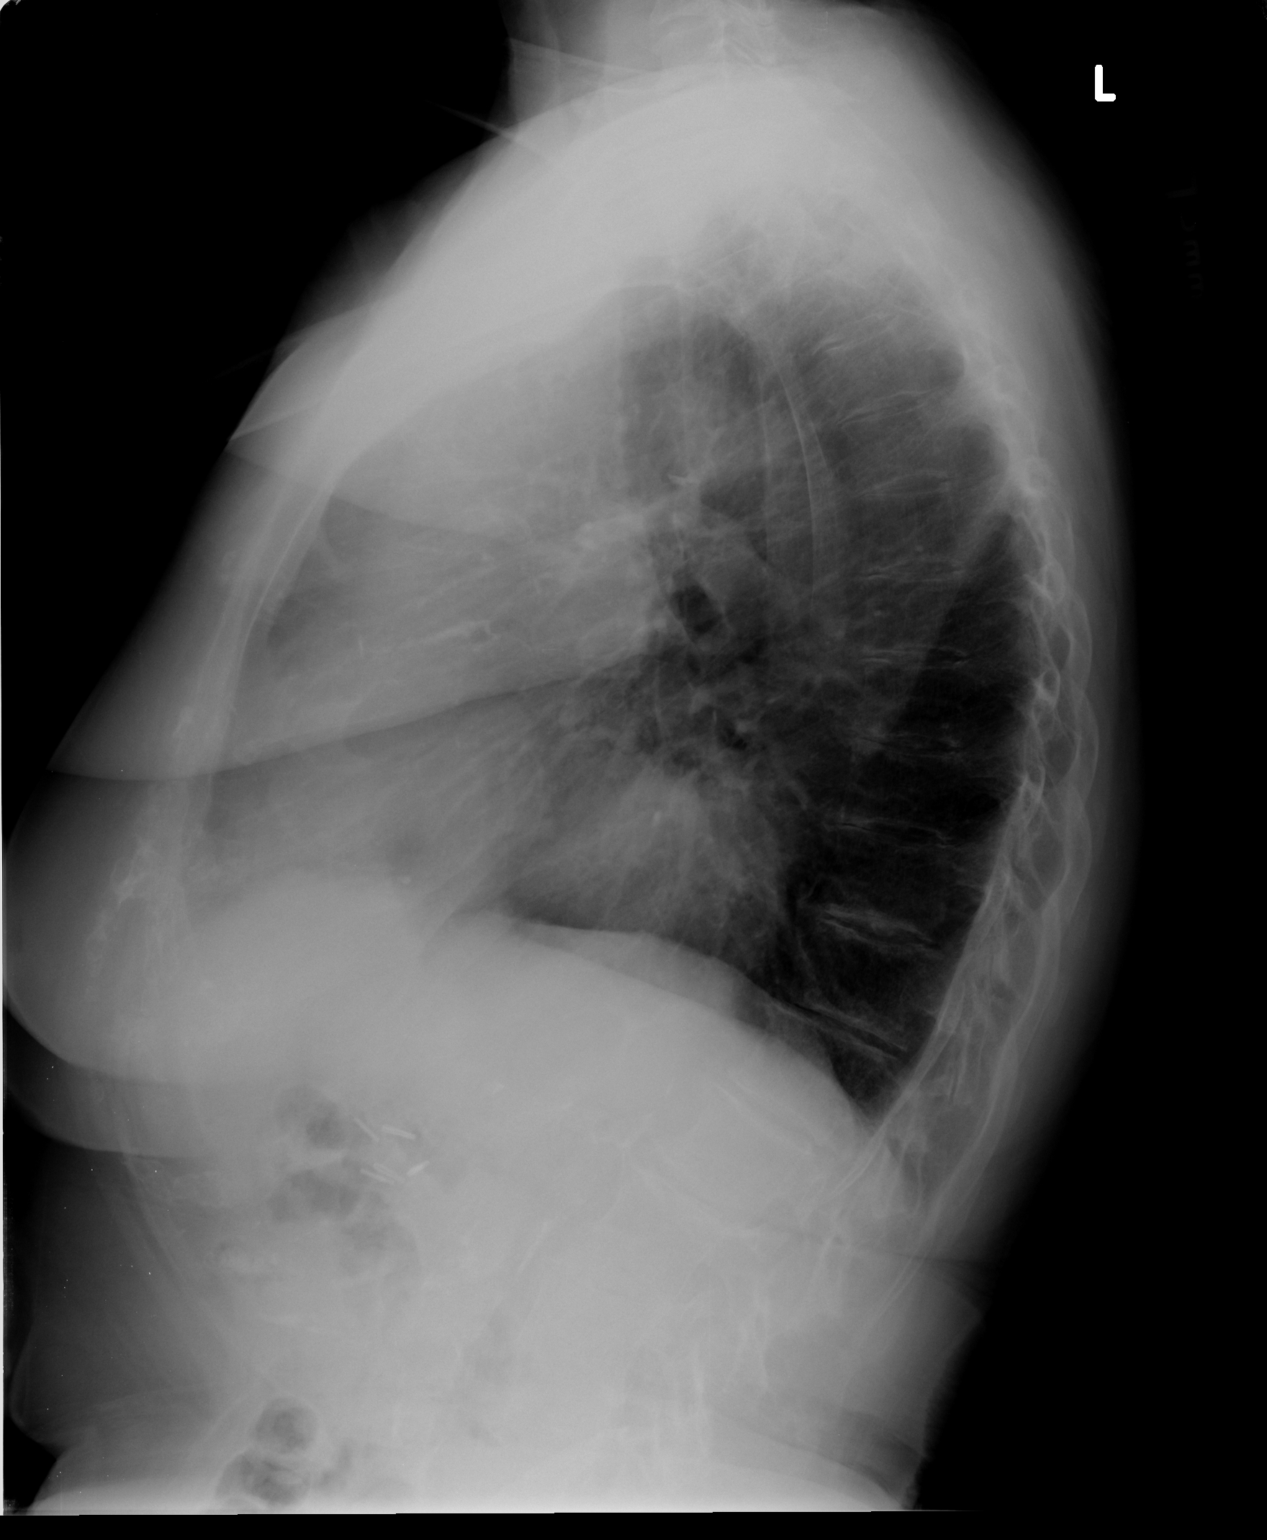

[2 of 2 positions shown; findings below may reference images not displayed]

FINDINGS: Heart size upper normal.  Large hiatal hernia.  Negative
for heart failure.  Lungs are clear without infiltrate or effusion.
IMPRESSION: Hiatal hernia.

No acute cardiopulmonary disease.

## 2013-04-19 IMAGING — CR DG CHEST 1V PORT
1 series · 1 of 1 positions shown · non-contrast
Comparison: Portable exam 9953 hours compared to 05/01/2011

CLINICAL DATA: Central line placement

PORTABLE CHEST - 1 VIEW

[AP]
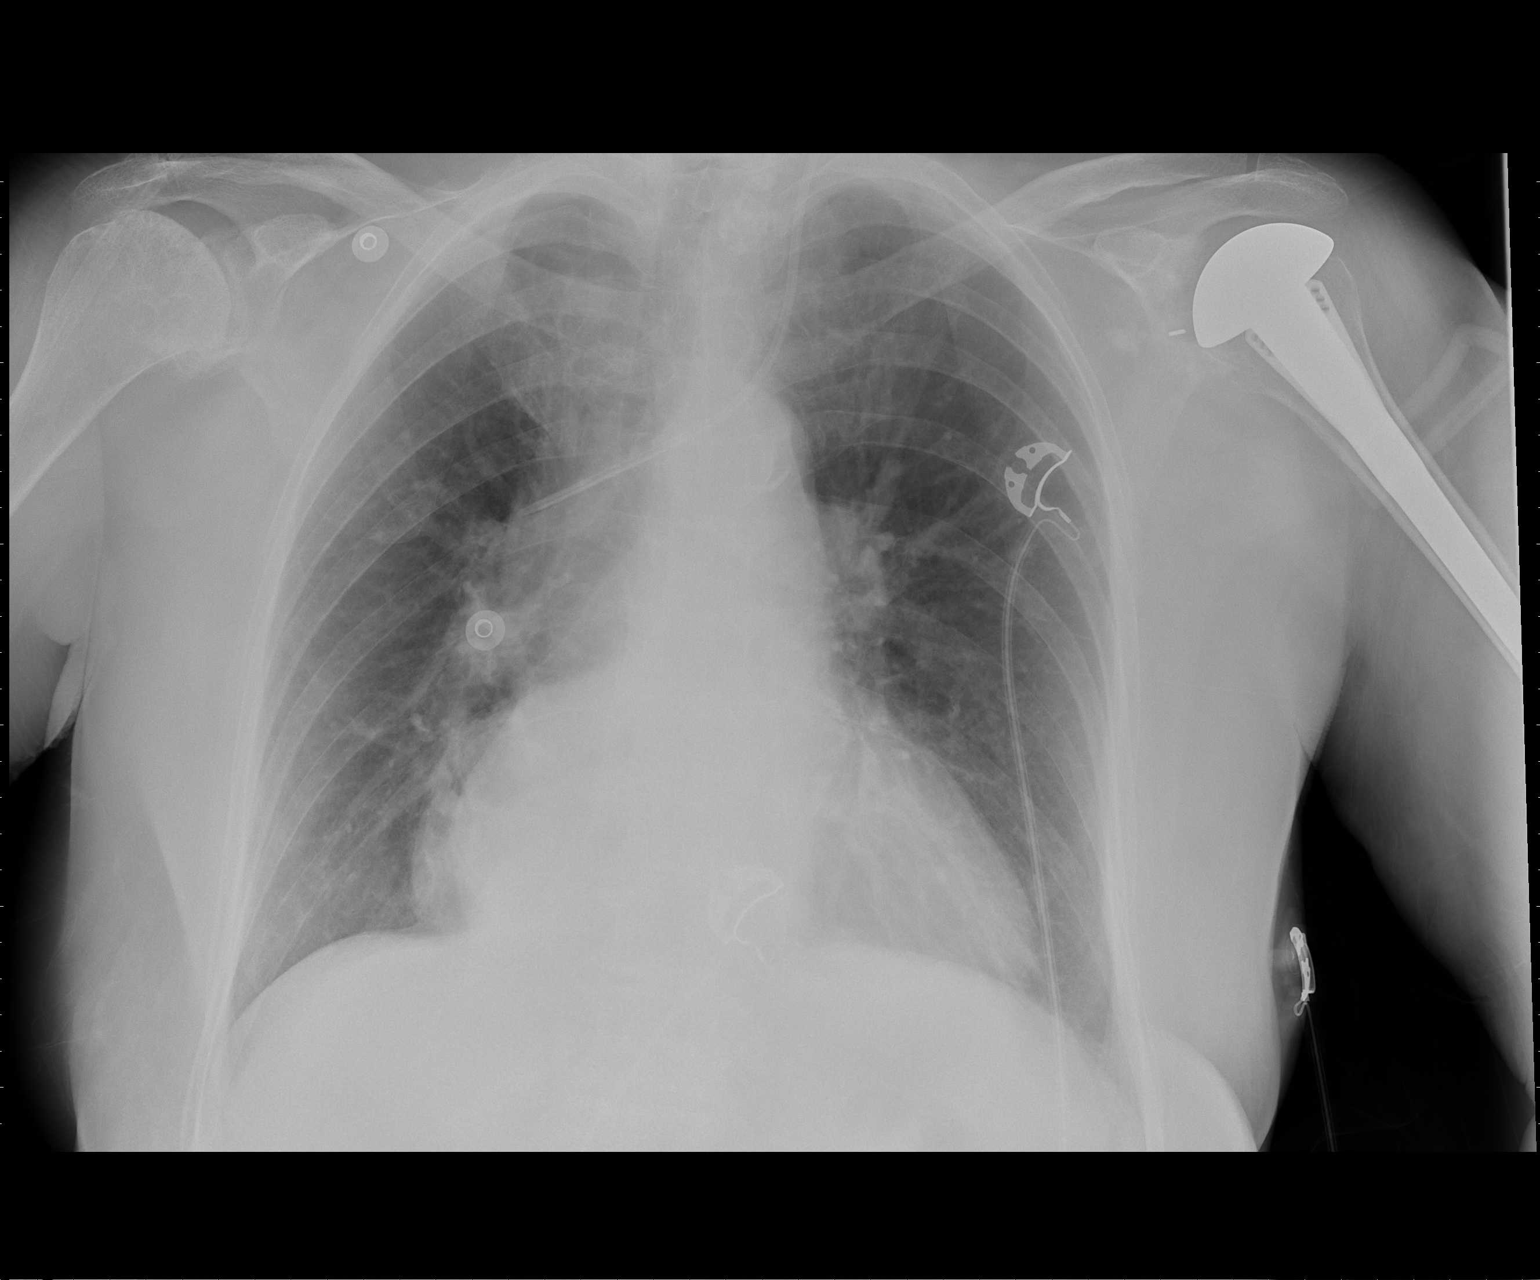

[1 of 1 positions shown; findings below may reference images not displayed]

FINDINGS: New left jugular central venous catheter, tip projecting over SVC.
Enlargement of cardiac silhouette with calcified tortuous aorta.
Large hiatal hernia.
Pulmonary vascularity normal.
Lungs clear.
No pneumothorax.
Bones demineralized with note of a left older prosthesis.
IMPRESSION: No pneumothorax following left jugular line placement.

## 2013-06-04 DIAGNOSIS — T8489XA Other specified complication of internal orthopedic prosthetic devices, implants and grafts, initial encounter: Secondary | ICD-10-CM | POA: Diagnosis not present

## 2013-06-04 DIAGNOSIS — T84059A Periprosthetic osteolysis of unspecified internal prosthetic joint, initial encounter: Secondary | ICD-10-CM | POA: Diagnosis not present

## 2013-06-11 DIAGNOSIS — Z8551 Personal history of malignant neoplasm of bladder: Secondary | ICD-10-CM | POA: Diagnosis not present

## 2013-08-03 DIAGNOSIS — Z5189 Encounter for other specified aftercare: Secondary | ICD-10-CM | POA: Diagnosis not present

## 2013-09-03 ENCOUNTER — Encounter: Payer: Self-pay | Admitting: Cardiology

## 2013-09-03 DIAGNOSIS — I10 Essential (primary) hypertension: Secondary | ICD-10-CM | POA: Diagnosis not present

## 2013-09-03 DIAGNOSIS — I253 Aneurysm of heart: Secondary | ICD-10-CM | POA: Diagnosis not present

## 2013-09-03 DIAGNOSIS — E785 Hyperlipidemia, unspecified: Secondary | ICD-10-CM | POA: Diagnosis not present

## 2013-09-03 DIAGNOSIS — D62 Acute posthemorrhagic anemia: Secondary | ICD-10-CM | POA: Diagnosis not present

## 2013-09-03 DIAGNOSIS — D518 Other vitamin B12 deficiency anemias: Secondary | ICD-10-CM | POA: Diagnosis not present

## 2013-11-02 DIAGNOSIS — M25569 Pain in unspecified knee: Secondary | ICD-10-CM | POA: Diagnosis not present

## 2013-11-15 DIAGNOSIS — H04129 Dry eye syndrome of unspecified lacrimal gland: Secondary | ICD-10-CM | POA: Diagnosis not present

## 2013-12-01 NOTE — Progress Notes (Signed)
Please put orders in Epic surgery 12-09-13 pre op 12-09-13 Thanks

## 2013-12-02 ENCOUNTER — Other Ambulatory Visit: Payer: Self-pay | Admitting: Orthopedic Surgery

## 2013-12-02 ENCOUNTER — Encounter (HOSPITAL_COMMUNITY): Payer: Self-pay | Admitting: Pharmacy Technician

## 2013-12-02 NOTE — Progress Notes (Signed)
Preoperative surgical orders have been place into the Epic hospital system for Ottis Stain on 12/02/2013, 9:37 AM  by Mickel Crow for surgery on 12/15/2013.  Preop Revision Knee orders including Experal, IV Tylenol, and IV Decadron as long as there are no contraindications to the above medications. Arlee Muslim, PA-C

## 2013-12-07 NOTE — Patient Instructions (Addendum)
[employer]  12/07/2013   Your procedure is scheduled on:   12/15/2013  Report to [facility] Main Entrance and follow signs to  [place] arrive at  0900 AM.   Call this number if you have problems the morning of surgery [phone] or Presurgical Testing [phone].   Remember:  Do not eat food or drink any liquids After Midnight.  For Living Will and/or Health Care Power Attorney Forms: please provide copy for your medical record, may bring AM of surgery (forms should be already notarized-we do not provide this service).     Take these medicines the morning of surgery with A SIP OF WATER: Alprazolam;Metoprolol;Omeprazole                               You may not have any metal on your body including hair pins and piercings  Do not wear jewelry, make-up, lotions, powders, or deodorant.  Do not shave body hair  48 hours(2 days) of CHG soap use.                Do not bring valuables to the hospital. [place].  Contacts, dentures or bridgework may not be worn into surgery.  Leave suitcase in the car. After surgery it may be brought to your room.  For patients admitted to the hospital, checkout time is 11:00 AM the day of discharge.     Special Instructions: review fact sheets for MRSA information, Blood Transfusion fact sheet, Incentive Spirometry.  Remember: Type/Screen "Blue armsbands"- may not be removed once applied(would result in being retested AM of surgery, if removed). ________________________________________________________________________  [facility] - Preparing for Surgery Before surgery, you can play an important role.  Because skin is not sterile, your skin needs to be as free of germs as possible.  You can reduce the number of germs on your skin by washing with CHG (chlorahexidine gluconate) soap before surgery.  CHG is an antiseptic cleaner which kills germs and bonds with the skin to continue killing germs  even after washing. Please DO NOT use if you have an allergy to CHG or antibacterial soaps.  If your skin becomes reddened/irritated stop using the CHG and inform your nurse when you arrive at Short Stay. Do not shave (including legs and underarms) for at least 48 hours prior to the first CHG shower.  You may shave your face/neck. Please follow these instructions carefully:  1.  Shower with CHG Soap the night before surgery and the  morning of Surgery.  2.  If you choose to wash your hair, wash your hair first as usual with your  normal  shampoo.  3.  After you shampoo, rinse your hair and body thoroughly to remove the  shampoo.                            4.  Use CHG as you would any other liquid soap.  You can apply chg directly  to the skin and wash                       Gently with a scrungie or clean washcloth.  5.  Apply the CHG Soap to your body ONLY FROM THE NECK DOWN.   Do not use on face/ open  Wound or open sores. Avoid contact with eyes, ears mouth and genitals (private parts).                       Wash face,  Genitals (private parts) with your normal soap.             6.  Wash thoroughly, paying special attention to the area where your surgery  will be performed.  7.  Thoroughly rinse your body with warm water from the neck down.  8.  DO NOT shower/wash with your normal soap after using and rinsing off  the CHG Soap.                9.  Pat yourself dry with a clean towel.            10.  Wear clean pajamas.            11.  Place clean sheets on your bed the night of your first shower and do not  sleep with pets. Day of Surgery : Do not apply any lotions/deodorants the morning of surgery.  Please wear clean clothes to the hospital/surgery center.  FAILURE TO FOLLOW THESE INSTRUCTIONS MAY RESULT IN THE CANCELLATION OF YOUR SURGERY PATIENT SIGNATURE_________________________________  NURSE SIGNATURE__________________________________       Incentive  Spirometer  An incentive spirometer is a tool that can help keep your lungs clear and active. This tool measures how well you are filling your lungs with each breath. Taking long deep breaths may help reverse or decrease the chance of developing breathing (pulmonary) problems (especially infection) following:  A long period of time when you are unable to move or be active. BEFORE THE PROCEDURE   If the spirometer includes an indicator to show your best effort, your nurse or respiratory therapist will set it to a desired goal.  If possible, sit up straight or lean slightly forward. Try not to slouch.  Hold the incentive spirometer in an upright position. INSTRUCTIONS FOR USE  1. Sit on the edge of your bed if possible, or sit up as far as you can in bed or on a chair. 2. Hold the incentive spirometer in an upright position. 3. Breathe out normally. 4. Place the mouthpiece in your mouth and seal your lips tightly around it. 5. Breathe in slowly and as deeply as possible, raising the piston or the ball toward the top of the column. 6. Hold your breath for 3-5 seconds or for as long as possible. Allow the piston or ball to fall to the bottom of the column. 7. Remove the mouthpiece from your mouth and breathe out normally. 8. Rest for a few seconds and repeat Steps 1 through 7 at least 10 times every 1-2 hours when you are awake. Take your time and take a few normal breaths between deep breaths. 9. The spirometer may include an indicator to show your best effort. Use the indicator as a goal to work toward during each repetition. 10. After each set of 10 deep breaths, practice coughing to be sure your lungs are clear. If you have an incision (the cut made at the time of surgery), support your incision when coughing by placing a pillow or rolled up towels firmly against it. Once you are able to get out of bed, walk around indoors and cough well. You may stop using the incentive spirometer when  instructed by your caregiver.  RISKS AND COMPLICATIONS  Take your time so you  do not get dizzy or light-headed.  If you are in pain, you may need to take or ask for pain medication before doing incentive spirometry. It is harder to take a deep breath if you are having pain. AFTER USE  Rest and breathe slowly and easily.  It can be helpful to keep track of a log of your progress. Your caregiver can provide you with a simple table to help with this. If you are using the spirometer at home, follow these instructions: Door IF:   You are having difficultly using the spirometer.  You have trouble using the spirometer as often as instructed.  Your pain medication is not giving enough relief while using the spirometer.  You develop fever of 100.5 F (38.1 C) or higher. SEEK IMMEDIATE MEDICAL CARE IF:   You cough up bloody sputum that had not been present before.  You develop fever of 102 F (38.9 C) or greater.  You develop worsening pain at or near the incision site. MAKE SURE YOU:   Understand these instructions.  Will watch your condition.  Will get help right away if you are not doing well or get worse. Document Released: 07/15/2006 Document Revised: 05/27/2011 Document Reviewed: 09/15/2006 [employer] Patient Information 2014 [employer], [place].   ________________________________________________________________________  WHAT IS A BLOOD TRANSFUSION? Blood Transfusion Information  A transfusion is the replacement of blood or some of its parts. Blood is made up of multiple cells which provide different functions.  Red blood cells carry oxygen and are used for blood loss replacement.  White blood cells fight against infection.  Platelets control bleeding.  Plasma helps clot blood.  Other blood products are available for specialized needs, such as hemophilia or other clotting disorders. BEFORE THE TRANSFUSION  Who gives blood for transfusions?   Healthy  volunteers who are fully evaluated to make sure their blood is safe. This is blood bank blood. Transfusion therapy is the safest it has ever been in the practice of medicine. Before blood is taken from a donor, a complete history is taken to make sure that person has no history of diseases nor engages in risky social behavior (examples are intravenous drug use or sexual activity with multiple partners). The donor's travel history is screened to minimize risk of transmitting infections, such as malaria. The donated blood is tested for signs of infectious diseases, such as HIV and hepatitis. The blood is then tested to be sure it is compatible with you in order to minimize the chance of a transfusion reaction. If you or a relative donates blood, this is often done in anticipation of surgery and is not appropriate for emergency situations. It takes many days to process the donated blood. RISKS AND COMPLICATIONS Although transfusion therapy is very safe and saves many lives, the main dangers of transfusion include:   Getting an infectious disease.  Developing a transfusion reaction. This is an allergic reaction to something in the blood you were given. Every precaution is taken to prevent this. The decision to have a blood transfusion has been considered carefully by your caregiver before blood is given. Blood is not given unless the benefits outweigh the risks. AFTER THE TRANSFUSION  Right after receiving a blood transfusion, you will usually feel much better and more energetic. This is especially true if your red blood cells have gotten low (anemic). The transfusion raises the level of the red blood cells which carry oxygen, and this usually causes an energy increase.  The nurse administering the  transfusion will monitor you carefully for complications. HOME CARE INSTRUCTIONS  No special instructions are needed after a transfusion. You may find your energy is better. Speak with your caregiver about any  limitations on activity for underlying diseases you may have. SEEK MEDICAL CARE IF:   Your condition is not improving after your transfusion.  You develop redness or irritation at the intravenous (IV) site. SEEK IMMEDIATE MEDICAL CARE IF:  Any of the following symptoms occur over the next 12 hours:  Shaking chills.  You have a temperature by mouth above 102 F (38.9 C), not controlled by medicine.  Chest, back, or muscle pain.  People around you feel you are not acting correctly or are confused.  Shortness of breath or difficulty breathing.  Dizziness and fainting.  You get a rash or develop hives.  You have a decrease in urine output.  Your urine turns a dark color or changes to pink, red, or brown. Any of the following symptoms occur over the next 10 days:  You have a temperature by mouth above 102 F (38.9 C), not controlled by medicine.  Shortness of breath.  Weakness after normal activity.  The white part of the eye turns yellow (jaundice).  You have a decrease in the amount of urine or are urinating less often.  Your urine turns a dark color or changes to pink, red, or brown. Document Released: 03/01/2000 Document Revised: 05/27/2011 Document Reviewed: 10/19/2007 [facility] Patient Information 2014 [place], [place].  _______________________________________________________________________

## 2013-12-09 ENCOUNTER — Encounter (HOSPITAL_COMMUNITY)
Admission: RE | Admit: 2013-12-09 | Discharge: 2013-12-09 | Disposition: A | Discharge status: Home / Self Care | Payer: Medicare Other | Source: Ambulatory Visit | Attending: Orthopedic Surgery | Admitting: Orthopedic Surgery

## 2013-12-09 ENCOUNTER — Ambulatory Visit (HOSPITAL_COMMUNITY)
Admission: RE | Admit: 2013-12-09 | Discharge: 2013-12-09 | Disposition: A | Discharge status: Home / Self Care | Payer: Medicare Other | Source: Ambulatory Visit | Attending: Orthopedic Surgery | Admitting: Orthopedic Surgery

## 2013-12-09 ENCOUNTER — Encounter (HOSPITAL_COMMUNITY): Payer: Self-pay

## 2013-12-09 DIAGNOSIS — I10 Essential (primary) hypertension: Secondary | ICD-10-CM | POA: Insufficient documentation

## 2013-12-09 DIAGNOSIS — R1110 Vomiting, unspecified: Secondary | ICD-10-CM | POA: Diagnosis not present

## 2013-12-09 DIAGNOSIS — K219 Gastro-esophageal reflux disease without esophagitis: Secondary | ICD-10-CM | POA: Diagnosis not present

## 2013-12-09 DIAGNOSIS — I2510 Atherosclerotic heart disease of native coronary artery without angina pectoris: Secondary | ICD-10-CM | POA: Diagnosis not present

## 2013-12-09 DIAGNOSIS — C679 Malignant neoplasm of bladder, unspecified: Secondary | ICD-10-CM | POA: Diagnosis not present

## 2013-12-09 DIAGNOSIS — Z01818 Encounter for other preprocedural examination: Secondary | ICD-10-CM | POA: Diagnosis not present

## 2013-12-09 HISTORY — DX: Anemia, unspecified: D64.9

## 2013-12-09 HISTORY — DX: Pleurisy: R09.1

## 2013-12-09 LAB — COMPREHENSIVE METABOLIC PANEL
ALT: 17 U/L (ref 0–35)
AST: 32 U/L (ref 0–37)
Albumin: 3.9 g/dL (ref 3.5–5.2)
Alkaline Phosphatase: 32 U/L — ABNORMAL LOW (ref 39–117)
Anion gap: 14 (ref 5–15)
BILIRUBIN TOTAL: 0.5 mg/dL (ref 0.3–1.2)
BUN: 17 mg/dL (ref 6–23)
CO2: 26 meq/L (ref 19–32)
CREATININE: 0.97 mg/dL (ref 0.50–1.10)
Calcium: 10.3 mg/dL (ref 8.4–10.5)
Chloride: 100 mEq/L (ref 96–112)
GFR calc Af Amer: 60 mL/min — ABNORMAL LOW (ref >90)
GFR, EST NON AFRICAN AMERICAN: 52 mL/min — AB (ref >90)
GLUCOSE: 95 mg/dL (ref 70–99)
Potassium: 5 mEq/L (ref 3.7–5.3)
Sodium: 140 mEq/L (ref 137–147)
Total Protein: 7.6 g/dL (ref 6.0–8.3)

## 2013-12-09 LAB — URINALYSIS, ROUTINE W REFLEX MICROSCOPIC
BILIRUBIN URINE: NEGATIVE
GLUCOSE, UA: NEGATIVE mg/dL
Hgb urine dipstick: NEGATIVE
KETONES UR: NEGATIVE mg/dL
Leukocytes, UA: NEGATIVE
Nitrite: NEGATIVE
PH: 5.5 (ref 5.0–8.0)
Protein, ur: NEGATIVE mg/dL
SPECIFIC GRAVITY, URINE: 1.007 (ref 1.005–1.030)
Urobilinogen, UA: 1 mg/dL (ref 0.0–1.0)

## 2013-12-09 LAB — CBC
HCT: 39.4 % (ref 36.0–46.0)
HEMOGLOBIN: 13.2 g/dL (ref 12.0–15.0)
MCH: 32 pg (ref 26.0–34.0)
MCHC: 33.5 g/dL (ref 30.0–36.0)
MCV: 95.4 fL (ref 78.0–100.0)
Platelets: 122 10*3/uL — ABNORMAL LOW (ref 150–400)
RBC: 4.13 MIL/uL (ref 3.87–5.11)
RDW: 13.7 % (ref 11.5–15.5)
WBC: 5 10*3/uL (ref 4.0–10.5)

## 2013-12-09 LAB — APTT: aPTT: 27 seconds (ref 24–37)

## 2013-12-09 LAB — PROTIME-INR
INR: 1.1 (ref 0.00–1.49)
PROTHROMBIN TIME: 14.2 s (ref 11.6–15.2)

## 2013-12-09 LAB — SURGICAL PCR SCREEN
MRSA, PCR: NEGATIVE
STAPHYLOCOCCUS AUREUS: NEGATIVE

## 2013-12-09 NOTE — Progress Notes (Signed)
LOV note per [facility] ([facility]) 12/23/2012 per Dr Kirk Ruths

## 2013-12-10 NOTE — Progress Notes (Signed)
Clearance note on chart from Dr Delena Bali 09/03/2013

## 2013-12-14 ENCOUNTER — Other Ambulatory Visit: Payer: Self-pay | Admitting: Orthopedic Surgery

## 2013-12-14 NOTE — H&P (Signed)
Christina [name] DOB: Aug 07, 1925 Widowed / Language: English / Race: White Female Date of Admission:  12/15/2013 Chief Complaint:  Right Knee Pain History of Present Illness The patient is a 80 year old female who comes in for a preoperative history and physical. The patient is scheduled for a right polyethylene revision versus total knee revision to be performed by [name]. [name], MD at [facility] on 12/15/2013. The patient is a 80 year old female who presents for follow up of their knee. The patient is being followed for their right painful total knee. They are now about 10 years out from surgery, by Dr. [name]. Symptoms reported today include: pain, aching and difficulty ambulating. The patient feels that they are doing poorly and report their pain level to be moderate. Current treatment includes: bracing (She states that economy hinge brace actually made her knee feel worse; she has been using an ace wrap. She states she was up on her feet all day for two days in a row cooking, and doing other things to help her out. The knee continues to be a problem and now is ready to get the knee fixed. They have been treated conservatively in the past for the above stated problem and despite conservative measures, they continue to have progressive pain and severe functional limitations and dysfunction. They have failed non-operative management including home exercise, medications. It is felt that they would benefit from undergoing revision joint replacement. Risks and benefits of the procedure have been discussed with the patient and they elect to proceed with surgery. There are no active contraindications to surgery such as ongoing infection or rapidly progressive neurological disease.  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, subsequent encounter (V54.89  T84.092D) Pain due to total  knee replacement, subsequent encounter (V58.89  T84.84XD) Glenohumeral Arthritis (715.31  M19.019) Left bundle-branch block (426.3  I44.7) Bladder cancer (188.9  C67.9) Depressive disorder (311  F32.9) Anxiety Disorder Venous insufficiency (459.81  I87.2) Pulmonary Infarction Pulmonary Embolism Chronic kidney disease Goiter Coronary Artery Disease/Heart Disease Chronic Impaired Vision wears glasses Impaired Hearing Bronchitis Past History Pneumonia Past History Hypertension Hypercholesterolemia Varicose veins Gastroesophageal Reflux Disease Irritable bowel syndrome Urinary Incontinence Past History of Pulmonary Embolus  History of Blood Clot Right Leg Degenerative Disc Disease Hyperlipidemia Anemia Past History Bladder Noninvasive Cancerous Lesion Hypothyroidism Myocardial infarction Gastric Ulcer Non-Q Wave Myocardial Infarction  Family History Myocardial Infarction Father, Mother, Brother, Sister. Hypertension Father, Mother, Brother, Sister. Stroke Mother, Sister. Heart Disease Father, Mother, Brother, Sister.  Social History Marital status widowed Tobacco use Never smoker. Alcohol use Occasional alcohol use. Post-Surgical Plans Home versus Rehab Current work status Retired. Advance Directives Living Will, Healthcare POA  Medication History Hydrochlorothiazide (12.5MG  Capsule, Oral) Active. Ibandronate Sodium (150MG  Tablet, Oral) Active. Metoprolol Tartrate (25MG  Tablet, Oral) Active. Aspirin EC (81MG  Tablet DR, Oral) Active. Vitamin B6 (250MG  Tablet, Oral) Active. Diovan (320MG  Tablet, Oral) Active. Crestor (10MG  Tablet, Oral) Active. Xanax (0.5MG  Tablet, Oral) Active. PriLOSEC (20MG  Capsule DR, Oral) Active. Vitamin D3 (1000UNIT Tablet, Oral) Active. Vitamin C CR (1000MG  Tablet ER, Oral) Active. Fish Oil Specific dose unknown - Active.  Past Surgical History Cataract Surgery Date: 1999.  bilateral Tubal Ligation Date: 25. Appendectomy Date: 15. Cholecystectomy Date: 82. Right Knee Scope Date: 2005. Dr. Rodell Perna Right Hip Replacement Date: 2005. Dr. Rodell Perna Right Knee Replacement Date: 2005. Dr. Rodell Perna Left Knee Replacement Date: 2009. Dr.  [name] Carpal Tunnel Left Hand Date: 2009. Dr. Caralyn Guile Carpal Tunnel Right Hand Date: 2011. Dr. Caralyn Guile Left Shoulder Replacement Date: 2013. Dr. Onnie Graham   Review of Systems General Present- Fatigue. Not Present- Chills, Fever, Memory Loss, Night Sweats, Weight Gain and Weight Loss. Skin Not Present- Eczema, Hives, Itching, Lesions and Rash. HEENT Present- Hearing Loss and Hearing problems (Hearing Loss). Not Present- Dentures, Double Vision, Headache, Tinnitus and Visual Loss. Respiratory Present- Cough and Shortness of breath with exertion. Not Present- Allergies, Chronic Cough, Coughing up blood and Shortness of breath at rest. Cardiovascular Present- Swelling. Not Present- Chest Pain, Difficulty Breathing Lying Down, Murmur, Palpitations and Racing/skipping heartbeats. Gastrointestinal Present- Heartburn (occassional reflux). Not Present- Abdominal Pain, Bloody Stool, Constipation, Diarrhea, Difficulty Swallowing, Jaundice, Loss of appetitie, Nausea and Vomiting. Female Genitourinary Present- Incontinence (wears pads). Not Present- Blood in Urine, Discharge, Flank Pain, Painful Urination, Urgency, Urinary frequency, Urinary Retention, Urinating at Night and Weak urinary stream. Musculoskeletal Present- Back Pain, Joint Pain, Joint Swelling, Morning Stiffness, Muscle Pain, Muscle Weakness and Spasms. Neurological Present- Tremor (Essential Tremor). Not Present- Blackout spells, Difficulty with balance, Dizziness, Paralysis and Weakness. Psychiatric Not Present- Insomnia.   Vitals Weight: 136 lb Height: 58in Weight was reported by patient. Height was reported by patient. Body Surface Area: 1.59 m  Body Mass Index: 28.42 kg/m BP: 158/82 (Sitting, Left Arm, Standard)    Physical Exam  General Mental Status -Alert, cooperative and good historian. General Appearance-pleasant, Not in acute distress. Orientation-Oriented X3. Build & Nutrition-Petite, Well nourished and Well developed.  Head and Neck Head-normocephalic, atraumatic . Neck Global Assessment - supple, no bruit auscultated on the right, no bruit auscultated on the left.  Eye Vision-Wears corrective lenses. Pupil - Bilateral-Regular and Round. Motion - Bilateral-EOMI.  Chest and Lung Exam Auscultation Breath sounds - clear at anterior chest wall and clear at posterior chest wall. Adventitious sounds - No Adventitious sounds.  Cardiovascular Auscultation Rhythm - Regular rate and rhythm. Heart Sounds - S1 WNL and S2 WNL. Murmurs & Other Heart Sounds - Auscultation of the heart reveals - No Murmurs.  Abdomen Palpation/Percussion Tenderness - Abdomen is non-tender to palpation. Rigidity (guarding) - Abdomen is soft. Auscultation Auscultation of the abdomen reveals - Bowel sounds normal.  Female Genitourinary Note: Not done, not pertinent to present illness   Musculoskeletal Note: The right knee shows no swelling. She has a pretty significant varus and valgus laxity in that knee. She has a little bit of AP laxity also. She does have pain when I stress the knee. There is no tenderness to palpation. Range of motion is about hyperextension of 5 degrees down to flexion of 110 degrees.  RADIOGRAPHS: Radiographs from last visit. She has an Pharmacologist on the right side. There is a fair amount of polyethylene wear. She is not quite worn through but it is close.   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 Wynelle Link.  Plan is to  go home versus inpatient rehab.  PCP - Dr. Hoover Brunette - Patient has been seen preoperatively and felt to be stable for surgery.  Topical TXA (tranexamic acid) due to: CAD, Blood Clot  Signed electronically by Joelene Millin, III PA-C

## 2013-12-15 ENCOUNTER — Encounter (HOSPITAL_COMMUNITY): Payer: Self-pay | Admitting: *Deleted

## 2013-12-15 ENCOUNTER — Inpatient Hospital Stay (HOSPITAL_COMMUNITY)
Admission: RE | Admit: 2013-12-15 | Discharge: 2013-12-17 | DRG: 468 | Disposition: A | Discharge status: Home Health | Payer: Medicare Other | Source: Ambulatory Visit | Attending: Orthopedic Surgery | Admitting: Orthopedic Surgery

## 2013-12-15 ENCOUNTER — Encounter (HOSPITAL_COMMUNITY)
Admission: RE | Disposition: A | Discharge status: Home Health | Payer: Self-pay | Source: Ambulatory Visit | Attending: Orthopedic Surgery

## 2013-12-15 ENCOUNTER — Inpatient Hospital Stay (HOSPITAL_COMMUNITY): Payer: Medicare Other | Admitting: Anesthesiology

## 2013-12-15 ENCOUNTER — Encounter (HOSPITAL_COMMUNITY): Payer: Medicare Other | Admitting: Anesthesiology

## 2013-12-15 DIAGNOSIS — M810 Age-related osteoporosis without current pathological fracture: Secondary | ICD-10-CM | POA: Diagnosis present

## 2013-12-15 DIAGNOSIS — I10 Essential (primary) hypertension: Secondary | ICD-10-CM | POA: Diagnosis present

## 2013-12-15 DIAGNOSIS — Y831 Surgical operation with implant of artificial internal device as the cause of abnormal reaction of the patient, or of later complication, without mention of misadventure at the time of the procedure: Secondary | ICD-10-CM | POA: Diagnosis present

## 2013-12-15 DIAGNOSIS — F329 Major depressive disorder, single episode, unspecified: Secondary | ICD-10-CM | POA: Diagnosis present

## 2013-12-15 DIAGNOSIS — M25561 Pain in right knee: Secondary | ICD-10-CM | POA: Diagnosis not present

## 2013-12-15 DIAGNOSIS — Z8711 Personal history of peptic ulcer disease: Secondary | ICD-10-CM

## 2013-12-15 DIAGNOSIS — T84069A Wear of articular bearing surface of unspecified internal prosthetic joint, initial encounter: Secondary | ICD-10-CM | POA: Diagnosis not present

## 2013-12-15 DIAGNOSIS — K219 Gastro-esophageal reflux disease without esophagitis: Secondary | ICD-10-CM | POA: Diagnosis present

## 2013-12-15 DIAGNOSIS — M179 Osteoarthritis of knee, unspecified: Secondary | ICD-10-CM | POA: Diagnosis present

## 2013-12-15 DIAGNOSIS — Z96641 Presence of right artificial hip joint: Secondary | ICD-10-CM | POA: Diagnosis present

## 2013-12-15 DIAGNOSIS — I2510 Atherosclerotic heart disease of native coronary artery without angina pectoris: Secondary | ICD-10-CM | POA: Diagnosis present

## 2013-12-15 DIAGNOSIS — Z96652 Presence of left artificial knee joint: Secondary | ICD-10-CM | POA: Diagnosis present

## 2013-12-15 DIAGNOSIS — T84062A Wear of articular bearing surface of internal prosthetic right knee joint, initial encounter: Principal | ICD-10-CM | POA: Diagnosis present

## 2013-12-15 DIAGNOSIS — Z8551 Personal history of malignant neoplasm of bladder: Secondary | ICD-10-CM

## 2013-12-15 DIAGNOSIS — I252 Old myocardial infarction: Secondary | ICD-10-CM | POA: Diagnosis not present

## 2013-12-15 DIAGNOSIS — Z7982 Long term (current) use of aspirin: Secondary | ICD-10-CM | POA: Diagnosis not present

## 2013-12-15 DIAGNOSIS — Z79899 Other long term (current) drug therapy: Secondary | ICD-10-CM

## 2013-12-15 DIAGNOSIS — F419 Anxiety disorder, unspecified: Secondary | ICD-10-CM | POA: Diagnosis present

## 2013-12-15 DIAGNOSIS — Z823 Family history of stroke: Secondary | ICD-10-CM

## 2013-12-15 DIAGNOSIS — Z86711 Personal history of pulmonary embolism: Secondary | ICD-10-CM | POA: Diagnosis not present

## 2013-12-15 DIAGNOSIS — Z8249 Family history of ischemic heart disease and other diseases of the circulatory system: Secondary | ICD-10-CM

## 2013-12-15 DIAGNOSIS — E785 Hyperlipidemia, unspecified: Secondary | ICD-10-CM | POA: Diagnosis present

## 2013-12-15 DIAGNOSIS — M1710 Unilateral primary osteoarthritis, unspecified knee: Secondary | ICD-10-CM | POA: Diagnosis present

## 2013-12-15 DIAGNOSIS — I447 Left bundle-branch block, unspecified: Secondary | ICD-10-CM | POA: Diagnosis present

## 2013-12-15 DIAGNOSIS — H9190 Unspecified hearing loss, unspecified ear: Secondary | ICD-10-CM | POA: Diagnosis present

## 2013-12-15 DIAGNOSIS — T84498A Other mechanical complication of other internal orthopedic devices, implants and grafts, initial encounter: Secondary | ICD-10-CM | POA: Diagnosis not present

## 2013-12-15 DIAGNOSIS — Z96611 Presence of right artificial shoulder joint: Secondary | ICD-10-CM | POA: Diagnosis present

## 2013-12-15 DIAGNOSIS — E039 Hypothyroidism, unspecified: Secondary | ICD-10-CM | POA: Diagnosis present

## 2013-12-15 DIAGNOSIS — Z86010 Personal history of colonic polyps: Secondary | ICD-10-CM

## 2013-12-15 DIAGNOSIS — T84099A Other mechanical complication of unspecified internal joint prosthesis, initial encounter: Secondary | ICD-10-CM | POA: Diagnosis not present

## 2013-12-15 DIAGNOSIS — Z96659 Presence of unspecified artificial knee joint: Secondary | ICD-10-CM | POA: Diagnosis not present

## 2013-12-15 DIAGNOSIS — T84012A Broken internal right knee prosthesis, initial encounter: Secondary | ICD-10-CM

## 2013-12-15 HISTORY — PX: TOTAL KNEE REVISION: SHX996

## 2013-12-15 LAB — TYPE AND SCREEN
ABO/RH(D): A POS
Antibody Screen: NEGATIVE

## 2013-12-15 SURGERY — TOTAL KNEE REVISION
Anesthesia: Spinal | Site: Knee | Laterality: Right

## 2013-12-15 MED ORDER — BISACODYL 10 MG RE SUPP: 10.0000 mg | Freq: Every day | RECTAL | Status: DC | PRN

## 2013-12-15 MED ORDER — DEXAMETHASONE SODIUM PHOSPHATE 10 MG/ML IJ SOLN
INTRAMUSCULAR | Status: DC | PRN
  Administered 2013-12-15: 10 mg via INTRAVENOUS

## 2013-12-15 MED ORDER — FENTANYL CITRATE 0.05 MG/ML IJ SOLN
INTRAMUSCULAR | Status: DC | PRN
  Administered 2013-12-15: 25 ug via INTRAVENOUS
  Administered 2013-12-15: 50 ug via INTRAVENOUS
  Administered 2013-12-15: 25 ug via INTRAVENOUS

## 2013-12-15 MED ORDER — ONDANSETRON HCL 4 MG/2ML IJ SOLN
4.0000 mg | Freq: Four times a day (QID) | INTRAMUSCULAR | Status: DC | PRN
Start: 2013-12-15 — End: 2013-12-17

## 2013-12-15 MED ORDER — HYDROMORPHONE HCL 1 MG/ML IJ SOLN
0.5000 mg | INTRAMUSCULAR | Status: DC | PRN
  Administered 2013-12-15: 0.5 mg via INTRAVENOUS
  Filled 2013-12-15: qty 1

## 2013-12-15 MED ORDER — MIDAZOLAM HCL 2 MG/2ML IJ SOLN
INTRAMUSCULAR | Status: AC
  Filled 2013-12-15: qty 2

## 2013-12-15 MED ORDER — RIVAROXABAN 10 MG PO TABS
10.0000 mg | ORAL_TABLET | Freq: Every day | ORAL | Status: DC
  Administered 2013-12-16 – 2013-12-17 (×2): 10 mg via ORAL
  Filled 2013-12-15 (×3): qty 1

## 2013-12-15 MED ORDER — DEXAMETHASONE SODIUM PHOSPHATE 10 MG/ML IJ SOLN
10.0000 mg | Freq: Once | INTRAMUSCULAR | Status: AC
  Administered 2013-12-16: 10 mg via INTRAVENOUS
  Filled 2013-12-15: qty 1

## 2013-12-15 MED ORDER — LACTATED RINGERS IV SOLN: INTRAVENOUS | Status: DC

## 2013-12-15 MED ORDER — OXYCODONE HCL 5 MG PO TABS
5.0000 mg | ORAL_TABLET | ORAL | Status: DC | PRN
  Administered 2013-12-15 – 2013-12-17 (×5): 5 mg via ORAL
  Filled 2013-12-15 (×6): qty 1

## 2013-12-15 MED ORDER — BUPIVACAINE LIPOSOME 1.3 % IJ SUSP
20.0000 mL | Freq: Once | INTRAMUSCULAR | Status: DC
  Filled 2013-12-15: qty 20

## 2013-12-15 MED ORDER — CEFAZOLIN SODIUM-DEXTROSE 2-3 GM-% IV SOLR
INTRAVENOUS | Status: AC
  Filled 2013-12-15: qty 50

## 2013-12-15 MED ORDER — SODIUM CHLORIDE 0.9 % IV SOLN
10.0000 mg | INTRAVENOUS | Status: DC | PRN
  Administered 2013-12-15: 40 ug/min via INTRAVENOUS

## 2013-12-15 MED ORDER — 0.9 % SODIUM CHLORIDE (POUR BTL) OPTIME
TOPICAL | Status: DC | PRN
  Administered 2013-12-15: 1000 mL

## 2013-12-15 MED ORDER — METOPROLOL TARTRATE 25 MG PO TABS
25.0000 mg | ORAL_TABLET | Freq: Two times a day (BID) | ORAL | Status: DC
  Administered 2013-12-15 – 2013-12-17 (×4): 25 mg via ORAL
  Filled 2013-12-15 (×5): qty 1

## 2013-12-15 MED ORDER — DEXAMETHASONE SODIUM PHOSPHATE 10 MG/ML IJ SOLN: 10.0000 mg | Freq: Once | INTRAMUSCULAR | Status: DC

## 2013-12-15 MED ORDER — ACETAMINOPHEN 325 MG PO TABS: 650.0000 mg | ORAL_TABLET | Freq: Four times a day (QID) | ORAL | Status: DC | PRN

## 2013-12-15 MED ORDER — ALPRAZOLAM 0.5 MG PO TABS
0.5000 mg | ORAL_TABLET | Freq: Two times a day (BID) | ORAL | Status: DC | PRN
  Administered 2013-12-15 – 2013-12-16 (×2): 0.5 mg via ORAL
  Filled 2013-12-15 (×2): qty 1

## 2013-12-15 MED ORDER — BUPIVACAINE HCL (PF) 0.75 % IJ SOLN
INTRAMUSCULAR | Status: DC | PRN
  Administered 2013-12-15: 13 mg via INTRATHECAL

## 2013-12-15 MED ORDER — TRANEXAMIC ACID 100 MG/ML IV SOLN
2000.0000 mg | Freq: Once | INTRAVENOUS | Status: DC
  Filled 2013-12-15: qty 20

## 2013-12-15 MED ORDER — METHOCARBAMOL 1000 MG/10ML IJ SOLN
500.0000 mg | Freq: Four times a day (QID) | INTRAVENOUS | Status: DC | PRN
  Filled 2013-12-15: qty 5

## 2013-12-15 MED ORDER — BUPIVACAINE HCL (PF) 0.25 % IJ SOLN
INTRAMUSCULAR | Status: AC
  Filled 2013-12-15: qty 30

## 2013-12-15 MED ORDER — PROPOFOL INFUSION 10 MG/ML OPTIME
INTRAVENOUS | Status: DC | PRN
  Administered 2013-12-15: 75 ug/kg/min via INTRAVENOUS

## 2013-12-15 MED ORDER — PROPOFOL 10 MG/ML IV BOLUS
INTRAVENOUS | Status: AC
  Filled 2013-12-15: qty 20

## 2013-12-15 MED ORDER — IRBESARTAN 300 MG PO TABS
300.0000 mg | ORAL_TABLET | Freq: Every day | ORAL | Status: DC
  Administered 2013-12-17: 300 mg via ORAL
  Filled 2013-12-15 (×2): qty 1

## 2013-12-15 MED ORDER — METOCLOPRAMIDE HCL 5 MG/ML IJ SOLN: 5.0000 mg | Freq: Three times a day (TID) | INTRAMUSCULAR | Status: DC | PRN

## 2013-12-15 MED ORDER — BUPIVACAINE LIPOSOME 1.3 % IJ SUSP
INTRAMUSCULAR | Status: DC | PRN
  Administered 2013-12-15: 20 mL

## 2013-12-15 MED ORDER — MENTHOL 3 MG MT LOZG
1.0000 | LOZENGE | OROMUCOSAL | Status: DC | PRN
Start: 2013-12-15 — End: 2013-12-17
  Filled 2013-12-15: qty 9

## 2013-12-15 MED ORDER — TRANEXAMIC ACID 100 MG/ML IV SOLN
2000.0000 mg | INTRAVENOUS | Status: DC | PRN
  Administered 2013-12-15: 2000 mg via TOPICAL

## 2013-12-15 MED ORDER — PANTOPRAZOLE SODIUM 40 MG PO TBEC: 40.0000 mg | DELAYED_RELEASE_TABLET | Freq: Every day | ORAL | Status: DC

## 2013-12-15 MED ORDER — SODIUM CHLORIDE 0.9 % IR SOLN
Status: DC | PRN
  Administered 2013-12-15: 3000 mL

## 2013-12-15 MED ORDER — CEFAZOLIN SODIUM-DEXTROSE 2-3 GM-% IV SOLR
2.0000 g | INTRAVENOUS | Status: AC
  Administered 2013-12-15: 2 g via INTRAVENOUS

## 2013-12-15 MED ORDER — FLEET ENEMA 7-19 GM/118ML RE ENEM: 1.0000 | ENEMA | Freq: Once | RECTAL | Status: AC | PRN

## 2013-12-15 MED ORDER — FENTANYL CITRATE 0.05 MG/ML IJ SOLN
INTRAMUSCULAR | Status: AC
  Filled 2013-12-15: qty 2

## 2013-12-15 MED ORDER — HYDROCHLOROTHIAZIDE 12.5 MG PO CAPS
12.5000 mg | ORAL_CAPSULE | Freq: Every morning | ORAL | Status: DC
  Administered 2013-12-16 – 2013-12-17 (×2): 12.5 mg via ORAL
  Filled 2013-12-15 (×2): qty 1

## 2013-12-15 MED ORDER — BUPIVACAINE IN DEXTROSE 0.75-8.25 % IT SOLN
INTRATHECAL | Status: DC | PRN
  Administered 2013-12-15: 2 mL via INTRATHECAL

## 2013-12-15 MED ORDER — PHENOL 1.4 % MT LIQD
1.0000 | OROMUCOSAL | Status: DC | PRN
  Filled 2013-12-15: qty 177

## 2013-12-15 MED ORDER — TRAMADOL HCL 50 MG PO TABS: 50.0000 mg | ORAL_TABLET | Freq: Four times a day (QID) | ORAL | Status: DC | PRN

## 2013-12-15 MED ORDER — HYDROMORPHONE HCL 1 MG/ML IJ SOLN: 0.2500 mg | INTRAMUSCULAR | Status: DC | PRN

## 2013-12-15 MED ORDER — DOCUSATE SODIUM 100 MG PO CAPS
100.0000 mg | ORAL_CAPSULE | Freq: Two times a day (BID) | ORAL | Status: DC
  Administered 2013-12-15 – 2013-12-17 (×4): 100 mg via ORAL

## 2013-12-15 MED ORDER — SODIUM CHLORIDE 0.9 % IJ SOLN
INTRAMUSCULAR | Status: AC
  Filled 2013-12-15: qty 50

## 2013-12-15 MED ORDER — SODIUM CHLORIDE 0.9 % IJ SOLN
INTRAMUSCULAR | Status: DC | PRN
  Administered 2013-12-15: 30 mL

## 2013-12-15 MED ORDER — DIPHENHYDRAMINE HCL 12.5 MG/5ML PO ELIX
12.5000 mg | ORAL_SOLUTION | ORAL | Status: DC | PRN
  Administered 2013-12-15 (×2): 12.5 mg via ORAL
  Administered 2013-12-16: 25 mg via ORAL
  Filled 2013-12-15 (×2): qty 5
  Filled 2013-12-15: qty 10

## 2013-12-15 MED ORDER — METOCLOPRAMIDE HCL 10 MG PO TABS: 5.0000 mg | ORAL_TABLET | Freq: Three times a day (TID) | ORAL | Status: DC | PRN

## 2013-12-15 MED ORDER — BUPIVACAINE-EPINEPHRINE (PF) 0.25% -1:200000 IJ SOLN
INTRAMUSCULAR | Status: DC | PRN
  Administered 2013-12-15: 20 mL

## 2013-12-15 MED ORDER — POLYETHYLENE GLYCOL 3350 17 G PO PACK
17.0000 g | PACK | Freq: Every day | ORAL | Status: DC | PRN
Start: 2013-12-15 — End: 2013-12-17

## 2013-12-15 MED ORDER — METHOCARBAMOL 500 MG PO TABS
500.0000 mg | ORAL_TABLET | Freq: Four times a day (QID) | ORAL | Status: DC | PRN
  Administered 2013-12-16: 500 mg via ORAL
  Filled 2013-12-15: qty 1

## 2013-12-15 MED ORDER — CHLORHEXIDINE GLUCONATE 4 % EX LIQD: 60.0000 mL | Freq: Once | CUTANEOUS | Status: DC

## 2013-12-15 MED ORDER — ACETAMINOPHEN 10 MG/ML IV SOLN
1000.0000 mg | Freq: Once | INTRAVENOUS | Status: AC
  Administered 2013-12-15: 1000 mg via INTRAVENOUS
  Filled 2013-12-15: qty 100

## 2013-12-15 MED ORDER — CEFAZOLIN SODIUM-DEXTROSE 2-3 GM-% IV SOLR
2.0000 g | Freq: Four times a day (QID) | INTRAVENOUS | Status: AC
  Administered 2013-12-15 – 2013-12-16 (×2): 2 g via INTRAVENOUS
  Filled 2013-12-15 (×2): qty 50

## 2013-12-15 MED ORDER — OMEPRAZOLE 20 MG PO CPDR
20.0000 mg | DELAYED_RELEASE_CAPSULE | Freq: Every day | ORAL | Status: DC
  Administered 2013-12-16 – 2013-12-17 (×2): 20 mg via ORAL
  Filled 2013-12-15 (×2): qty 1

## 2013-12-15 MED ORDER — SODIUM CHLORIDE 0.9 % IV SOLN
INTRAVENOUS | Status: DC
  Administered 2013-12-15 – 2013-12-16 (×4): via INTRAVENOUS

## 2013-12-15 MED ORDER — ONDANSETRON HCL 4 MG/2ML IJ SOLN
INTRAMUSCULAR | Status: DC | PRN
  Administered 2013-12-15: 4 mg via INTRAVENOUS

## 2013-12-15 MED ORDER — ONDANSETRON HCL 4 MG PO TABS: 4.0000 mg | ORAL_TABLET | Freq: Four times a day (QID) | ORAL | Status: DC | PRN

## 2013-12-15 MED ORDER — SODIUM CHLORIDE 0.9 % IV SOLN: INTRAVENOUS | Status: DC

## 2013-12-15 MED ORDER — ACETAMINOPHEN 650 MG RE SUPP: 650.0000 mg | Freq: Four times a day (QID) | RECTAL | Status: DC | PRN

## 2013-12-15 MED ORDER — ACETAMINOPHEN 500 MG PO TABS
1000.0000 mg | ORAL_TABLET | Freq: Four times a day (QID) | ORAL | Status: AC
  Administered 2013-12-15 – 2013-12-16 (×4): 1000 mg via ORAL
  Filled 2013-12-15 (×5): qty 2

## 2013-12-15 MED ORDER — LACTATED RINGERS IV SOLN
INTRAVENOUS | Status: DC | PRN
  Administered 2013-12-15 (×2): via INTRAVENOUS

## 2013-12-15 SURGICAL SUPPLY — 59 items
BAG SPEC THK2 15X12 ZIP CLS (MISCELLANEOUS) ×1
BAG ZIPLOCK 12X15 (MISCELLANEOUS) ×2 IMPLANT
BANDAGE ELASTIC 6 VELCRO ST LF (GAUZE/BANDAGES/DRESSINGS) ×3 IMPLANT
BANDAGE ESMARK 6X9 LF (GAUZE/BANDAGES/DRESSINGS) ×1 IMPLANT
BLADE SAG 18X100X1.27 (BLADE) ×1 IMPLANT
BLADE SAW SGTL 11.0X1.19X90.0M (BLADE) ×1 IMPLANT
BNDG CMPR 9X6 STRL LF SNTH (GAUZE/BANDAGES/DRESSINGS) ×1
BNDG ESMARK 6X9 LF (GAUZE/BANDAGES/DRESSINGS) ×3
CLOSURE WOUND 1/2 X4 (GAUZE/BANDAGES/DRESSINGS) ×2
CUFF TOURN SGL QUICK 34 (TOURNIQUET CUFF) ×3
CUFF TRNQT CYL 34X4X40X1 (TOURNIQUET CUFF) ×1 IMPLANT
DRAPE EXTREMITY TIBURON (DRAPES) ×3 IMPLANT
DRAPE POUCH INSTRU U-SHP 10X18 (DRAPES) ×3 IMPLANT
DRAPE U-SHAPE 47X51 STRL (DRAPES) ×3 IMPLANT
DRSG ADAPTIC 3X8 NADH LF (GAUZE/BANDAGES/DRESSINGS) ×3 IMPLANT
DRSG PAD ABDOMINAL 8X10 ST (GAUZE/BANDAGES/DRESSINGS) ×3 IMPLANT
DURAPREP 26ML APPLICATOR (WOUND CARE) ×3 IMPLANT
ELECT REM PT RETURN 9FT ADLT (ELECTROSURGICAL) ×3
ELECTRODE REM PT RTRN 9FT ADLT (ELECTROSURGICAL) ×1 IMPLANT
EVACUATOR 1/8 PVC DRAIN (DRAIN) ×3 IMPLANT
FACESHIELD WRAPAROUND (MASK) ×9 IMPLANT
FACESHIELD WRAPAROUND OR TEAM (MASK) ×5 IMPLANT
GAUZE SPONGE 4X4 12PLY STRL (GAUZE/BANDAGES/DRESSINGS) ×3 IMPLANT
GLOVE BIO SURGEON STRL SZ7.5 (GLOVE) ×2 IMPLANT
GLOVE BIO SURGEON STRL SZ8 (GLOVE) ×3 IMPLANT
GLOVE BIOGEL PI IND STRL 8 (GLOVE) ×1 IMPLANT
GLOVE BIOGEL PI INDICATOR 8 (GLOVE) ×2
GLOVE SURG SS PI 6.5 STRL IVOR (GLOVE) IMPLANT
GOWN STRL REUS W/TWL LRG LVL3 (GOWN DISPOSABLE) ×3 IMPLANT
GOWN STRL REUS W/TWL XL LVL3 (GOWN DISPOSABLE) ×2 IMPLANT
HANDPIECE INTERPULSE COAX TIP (DISPOSABLE) ×3
IMMOBILIZER KNEE 20 (SOFTGOODS) ×2 IMPLANT
INSERT TIBIAL X3 (Orthopedic Implant) ×2 IMPLANT
KIT BASIN OR (CUSTOM PROCEDURE TRAY) ×3 IMPLANT
MANIFOLD NEPTUNE II (INSTRUMENTS) ×3 IMPLANT
NDL SAFETY ECLIPSE 18X1.5 (NEEDLE) ×2 IMPLANT
NEEDLE HYPO 18GX1.5 SHARP (NEEDLE) ×9
NS IRRIG 1000ML POUR BTL (IV SOLUTION) ×3 IMPLANT
PACK TOTAL JOINT (CUSTOM PROCEDURE TRAY) ×3 IMPLANT
PADDING CAST COTTON 6X4 STRL (CAST SUPPLIES) ×6 IMPLANT
POSITIONER SURGICAL ARM (MISCELLANEOUS) ×3 IMPLANT
SET HNDPC FAN SPRY TIP SCT (DISPOSABLE) ×1 IMPLANT
STAPLER VISISTAT 35W (STAPLE) ×1 IMPLANT
STRIP CLOSURE SKIN 1/2X4 (GAUZE/BANDAGES/DRESSINGS) ×2 IMPLANT
SUCTION FRAZIER 12FR DISP (SUCTIONS) ×3 IMPLANT
SUT MNCRL AB 4-0 PS2 18 (SUTURE) ×2 IMPLANT
SUT VIC AB 2-0 CT1 27 (SUTURE) ×9
SUT VIC AB 2-0 CT1 TAPERPNT 27 (SUTURE) ×3 IMPLANT
SUT VLOC 180 0 24IN GS25 (SUTURE) ×3 IMPLANT
SWAB COLLECTION DEVICE MRSA (MISCELLANEOUS) IMPLANT
SYR 20CC LL (SYRINGE) ×3 IMPLANT
SYR 50ML LL SCALE MARK (SYRINGE) ×5 IMPLANT
TOWEL OR 17X26 10 PK STRL BLUE (TOWEL DISPOSABLE) ×3 IMPLANT
TOWEL OR NON WOVEN STRL DISP B (DISPOSABLE) ×2 IMPLANT
TOWER CARTRIDGE SMART MIX (DISPOSABLE) ×1 IMPLANT
TRAY FOLEY CATH 14FRSI W/METER (CATHETERS) ×3 IMPLANT
TUBE ANAEROBIC SPECIMEN COL (MISCELLANEOUS) IMPLANT
WATER STERILE IRR 1500ML POUR (IV SOLUTION) ×3 IMPLANT
WRAP KNEE MAXI GEL POST OP (GAUZE/BANDAGES/DRESSINGS) ×2 IMPLANT

## 2013-12-15 NOTE — Brief Op Note (Signed)
12/15/2013  1:09 PM  PATIENT:  Christina [name]  80 y.o. female  PRE-OPERATIVE DIAGNOSIS:  POLYETHYLENE FAILURE OF RIGHT KNEE  POST-OPERATIVE DIAGNOSIS:  POLYETHYLENE FAILURE OF RIGHT KNEE  PROCEDURE:  Procedure(s): RIGHT KNEE POLYETHYLENE  EXCHANGE (Right)  SURGEON:  Surgeon(s) and Role:    * Gearlean Alf, MD - Primary  PHYSICIAN ASSISTANT:   ASSISTANTS: Arlee Muslim, PA-C   ANESTHESIA:   spinal  EBL:  Total I/O In: 1000 [I.V.:1000] Out: 100 [Urine:100]  BLOOD ADMINISTERED:none  DRAINS: (Medium) Hemovact drain(s) in the right knee with  Suction Open   LOCAL MEDICATIONS USED:  OTHER Exparel  COUNTS:  YES  TOURNIQUET:   Total Tourniquet Time Documented: Thigh (Right) - 21 minutes Total: Thigh (Right) - 21 minutes   DICTATION: .Note written in EPIC and Other Dictation: Dictation Number [id number]  PLAN OF CARE: Admit to inpatient   PATIENT DISPOSITION:  PACU - hemodynamically stable.

## 2013-12-15 NOTE — H&P (View-Only) (Signed)
Christina [name] DOB: 1925-08-27 Widowed / Language: English / Race: White Female Date of Admission:  12/15/2013 Chief Complaint:  Right Knee Pain History of Present Illness The patient is a 80 year old female who comes in for a preoperative history and physical. The patient is scheduled for a right polyethylene revision versus total knee revision to be performed by [name]. [name], MD at [facility] on 12/15/2013. The patient is a 80 year old female who presents for follow up of their knee. The patient is being followed for their right painful total knee. They are now about 10 years out from surgery, by Dr. [name]. Symptoms reported today include: pain, aching and difficulty ambulating. The patient feels that they are doing poorly and report their pain level to be moderate. Current treatment includes: bracing (She states that economy hinge brace actually made her knee feel worse; she has been using an ace wrap. She states she was up on her feet all day for two days in a row cooking, and doing other things to help her out. The knee continues to be a problem and now is ready to get the knee fixed. They have been treated conservatively in the past for the above stated problem and despite conservative measures, they continue to have progressive pain and severe functional limitations and dysfunction. They have failed non-operative management including home exercise, medications. It is felt that they would benefit from undergoing revision joint replacement. Risks and benefits of the procedure have been discussed with the patient and they elect to proceed with surgery. There are no active contraindications to surgery such as ongoing infection or rapidly progressive neurological disease.  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, subsequent encounter (V54.89  T84.092D) Pain due to total  knee replacement, subsequent encounter (V58.89  T84.84XD) Glenohumeral Arthritis (715.31  M19.019) Left bundle-branch block (426.3  I44.7) Bladder cancer (188.9  C67.9) Depressive disorder (311  F32.9) Anxiety Disorder Venous insufficiency (459.81  I87.2) Pulmonary Infarction Pulmonary Embolism Chronic kidney disease Goiter Coronary Artery Disease/Heart Disease Chronic Impaired Vision wears glasses Impaired Hearing Bronchitis Past History Pneumonia Past History Hypertension Hypercholesterolemia Varicose veins Gastroesophageal Reflux Disease Irritable bowel syndrome Urinary Incontinence Past History of Pulmonary Embolus  History of Blood Clot Right Leg Degenerative Disc Disease Hyperlipidemia Anemia Past History Bladder Noninvasive Cancerous Lesion Hypothyroidism Myocardial infarction Gastric Ulcer Non-Q Wave Myocardial Infarction  Family History Myocardial Infarction Father, Mother, Brother, Sister. Hypertension Father, Mother, Brother, Sister. Stroke Mother, Sister. Heart Disease Father, Mother, Brother, Sister.  Social History Marital status widowed Tobacco use Never smoker. Alcohol use Occasional alcohol use. Post-Surgical Plans Home versus Rehab Current work status Retired. Advance Directives Living Will, Healthcare POA  Medication History Hydrochlorothiazide (12.5MG  Capsule, Oral) Active. Ibandronate Sodium (150MG  Tablet, Oral) Active. Metoprolol Tartrate (25MG  Tablet, Oral) Active. Aspirin EC (81MG  Tablet DR, Oral) Active. Vitamin B6 (250MG  Tablet, Oral) Active. Diovan (320MG  Tablet, Oral) Active. Crestor (10MG  Tablet, Oral) Active. Xanax (0.5MG  Tablet, Oral) Active. PriLOSEC (20MG  Capsule DR, Oral) Active. Vitamin D3 (1000UNIT Tablet, Oral) Active. Vitamin C CR (1000MG  Tablet ER, Oral) Active. Fish Oil Specific dose unknown - Active.  Past Surgical History Cataract Surgery Date: 1999.  bilateral Tubal Ligation Date: 71. Appendectomy Date: 33. Cholecystectomy Date: 35. Right Knee Scope Date: 2005. Dr. Rodell Perna Right Hip Replacement Date: 2005. Dr. Rodell Perna Right Knee Replacement Date: 2005. Dr. Rodell Perna Left Knee Replacement Date: 2009. Dr.  [name] Carpal Tunnel Left Hand Date: 2009. Dr. Caralyn Guile Carpal Tunnel Right Hand Date: 2011. Dr. Caralyn Guile Left Shoulder Replacement Date: 2013. Dr. Onnie Graham   Review of Systems General Present- Fatigue. Not Present- Chills, Fever, Memory Loss, Night Sweats, Weight Gain and Weight Loss. Skin Not Present- Eczema, Hives, Itching, Lesions and Rash. HEENT Present- Hearing Loss and Hearing problems (Hearing Loss). Not Present- Dentures, Double Vision, Headache, Tinnitus and Visual Loss. Respiratory Present- Cough and Shortness of breath with exertion. Not Present- Allergies, Chronic Cough, Coughing up blood and Shortness of breath at rest. Cardiovascular Present- Swelling. Not Present- Chest Pain, Difficulty Breathing Lying Down, Murmur, Palpitations and Racing/skipping heartbeats. Gastrointestinal Present- Heartburn (occassional reflux). Not Present- Abdominal Pain, Bloody Stool, Constipation, Diarrhea, Difficulty Swallowing, Jaundice, Loss of appetitie, Nausea and Vomiting. Female Genitourinary Present- Incontinence (wears pads). Not Present- Blood in Urine, Discharge, Flank Pain, Painful Urination, Urgency, Urinary frequency, Urinary Retention, Urinating at Night and Weak urinary stream. Musculoskeletal Present- Back Pain, Joint Pain, Joint Swelling, Morning Stiffness, Muscle Pain, Muscle Weakness and Spasms. Neurological Present- Tremor (Essential Tremor). Not Present- Blackout spells, Difficulty with balance, Dizziness, Paralysis and Weakness. Psychiatric Not Present- Insomnia.   Vitals Weight: 136 lb Height: 58in Weight was reported by patient. Height was reported by patient. Body Surface Area: 1.59 m  Body Mass Index: 28.42 kg/m BP: 158/82 (Sitting, Left Arm, Standard)    Physical Exam  General Mental Status -Alert, cooperative and good historian. General Appearance-pleasant, Not in acute distress. Orientation-Oriented X3. Build & Nutrition-Petite, Well nourished and Well developed.  Head and Neck Head-normocephalic, atraumatic . Neck Global Assessment - supple, no bruit auscultated on the right, no bruit auscultated on the left.  Eye Vision-Wears corrective lenses. Pupil - Bilateral-Regular and Round. Motion - Bilateral-EOMI.  Chest and Lung Exam Auscultation Breath sounds - clear at anterior chest wall and clear at posterior chest wall. Adventitious sounds - No Adventitious sounds.  Cardiovascular Auscultation Rhythm - Regular rate and rhythm. Heart Sounds - S1 WNL and S2 WNL. Murmurs & Other Heart Sounds - Auscultation of the heart reveals - No Murmurs.  Abdomen Palpation/Percussion Tenderness - Abdomen is non-tender to palpation. Rigidity (guarding) - Abdomen is soft. Auscultation Auscultation of the abdomen reveals - Bowel sounds normal.  Female Genitourinary Note: Not done, not pertinent to present illness   Musculoskeletal Note: The right knee shows no swelling. She has a pretty significant varus and valgus laxity in that knee. She has a little bit of AP laxity also. She does have pain when I stress the knee. There is no tenderness to palpation. Range of motion is about hyperextension of 5 degrees down to flexion of 110 degrees.  RADIOGRAPHS: Radiographs from last visit. She has an Pharmacologist on the right side. There is a fair amount of polyethylene wear. She is not quite worn through but it is close.   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 Wynelle Link.  Plan is to  go home versus inpatient rehab.  PCP - Dr. Hoover Brunette - Patient has been seen preoperatively and felt to be stable for surgery.  Topical TXA (tranexamic acid) due to: CAD, Blood Clot  Signed electronically by Joelene Millin, III PA-C

## 2013-12-15 NOTE — Progress Notes (Signed)
Utilization review completed.  

## 2013-12-15 NOTE — Interval H&P Note (Signed)
History and Physical Interval Note:  12/15/2013 11:11 AM  Christina [name]  has presented today for surgery, with the diagnosis of POLYETHYLENE FAILURE OF RIGHT KNEE  The various methods of treatment have been discussed with the patient and family. After consideration of risks, benefits and other options for treatment, the patient has consented to  Procedure(s): RIGHT KNEE POLYETHYLENE VERSES A TOTAL KNEE ARTHROPLASTY REVISION (Right) as a surgical intervention .  The patient's history has been reviewed, patient examined, no change in status, stable for surgery.  I have reviewed the patient's chart and labs.  Questions were answered to the patient's satisfaction.     Gearlean Alf

## 2013-12-15 NOTE — Anesthesia Postprocedure Evaluation (Signed)
  Anesthesia Post-op Note  Patient: Christina [name]  Procedure(s) Performed: Procedure(s) (LRB): RIGHT KNEE POLYETHYLENE  EXCHANGE (Right)  Patient Location: PACU  Anesthesia Type: Spinal  Level of Consciousness: awake and alert   Airway and Oxygen Therapy: Patient Spontanous Breathing  Post-op Pain: mild  Post-op Assessment: Post-op Vital signs reviewed, Patient's Cardiovascular Status Stable, Respiratory Function Stable, Patent Airway and No signs of Nausea or vomiting  Last Vitals:  Filed Vitals:   12/15/13 1430  BP: 139/64  Pulse: 57  Temp:   Resp: 13    Post-op Vital Signs: stable   Complications: No apparent anesthesia complications

## 2013-12-15 NOTE — Anesthesia Preprocedure Evaluation (Addendum)
Anesthesia Evaluation  Patient identified by MRN, date of birth, ID band Patient awake    Reviewed: Allergy & Precautions, H&P , NPO status , Patient's Chart, lab work & pertinent test results, reviewed documented beta blocker date and time   History of Anesthesia Complications (+) PONV  Airway Mallampati: II TM Distance: >3 FB Neck ROM: full    Dental no notable dental hx. (+) Teeth Intact, Dental Advisory Given   Pulmonary neg pulmonary ROS,  PE 2011 - resolved breath sounds clear to auscultation  Pulmonary exam normal       Cardiovascular hypertension, Pt. on medications and Pt. on home beta blockers + CAD and + Past MI Rhythm:regular Rate:Normal  Non - ST elevation MI 3/13.  Medical management.  Myoview 08/20/11 non ischemia.  LBBB   Neuro/Psych Anxiety Depression negative neurological ROS  negative psych ROS   GI/Hepatic negative GI ROS, Neg liver ROS, hiatal hernia, PUD, GERD-  Medicated and Controlled,Esophageal stricuture   Endo/Other  negative endocrine ROS  Renal/GU negative Renal ROS  negative genitourinary   Musculoskeletal   Abdominal   Peds  Hematology negative hematology ROS (+)   Anesthesia Other Findings   Reproductive/Obstetrics negative OB ROS                         Anesthesia Physical Anesthesia Plan  ASA: III  Anesthesia Plan: Spinal   Post-op Pain Management:    Induction:   Airway Management Planned: Simple Face Mask  Additional Equipment:   Intra-op Plan:   Post-operative Plan:   Informed Consent: I have reviewed the patients History and Physical, chart, labs and discussed the procedure including the risks, benefits and alternatives for the proposed anesthesia with the patient or authorized representative who has indicated his/her understanding and acceptance.   Dental Advisory Given  Plan Discussed with: CRNA and Surgeon  Anesthesia Plan Comments:         Anesthesia Quick Evaluation

## 2013-12-15 NOTE — Transfer of Care (Signed)
Immediate Anesthesia Transfer of Care Note  Patient: Christina [name]  Procedure(s) Performed: Procedure(s): RIGHT KNEE POLYETHYLENE  EXCHANGE (Right)  Patient Location: PACU  Anesthesia Type:Spinal  Level of Consciousness: awake, oriented, patient cooperative, lethargic and responds to stimulation  Airway & Oxygen Therapy: Patient Spontanous Breathing and Patient connected to face mask oxygen  Post-op Assessment: Report given to PACU RN and Post -op Vital signs reviewed and stable  Post vital signs: Reviewed and stable  Complications: No apparent anesthesia complications

## 2013-12-15 NOTE — Anesthesia Procedure Notes (Signed)
Spinal  Patient location during procedure: OR Start time: 12/15/2013 12:13 PM End time: 12/15/2013 12:18 PM Staffing Anesthesiologist: Rod Mae L Performed by: anesthesiologist  Preanesthetic Checklist Completed: patient identified, site marked, surgical consent, pre-op evaluation, timeout performed, IV checked, risks and benefits discussed and monitors and equipment checked Spinal Block Patient position: sitting Prep: Betadine Patient monitoring: heart rate, continuous pulse ox and blood pressure Approach: left paramedian Location: L3-4 Injection technique: single-shot Needle Needle type: Spinocan  Needle gauge: 22 G Needle length: 9 cm Assessment Sensory level: T6 Additional Notes Expiration date of kit checked and confirmed. Patient tolerated procedure well, without complications.

## 2013-12-16 ENCOUNTER — Encounter (HOSPITAL_COMMUNITY): Payer: Self-pay | Admitting: Orthopedic Surgery

## 2013-12-16 LAB — BASIC METABOLIC PANEL
Anion gap: 13 (ref 5–15)
BUN: 22 mg/dL (ref 6–23)
CHLORIDE: 104 meq/L (ref 96–112)
CO2: 23 mEq/L (ref 19–32)
Calcium: 9 mg/dL (ref 8.4–10.5)
Creatinine, Ser: 0.94 mg/dL (ref 0.50–1.10)
GFR calc Af Amer: 62 mL/min — ABNORMAL LOW (ref >90)
GFR calc non Af Amer: 54 mL/min — ABNORMAL LOW (ref >90)
GLUCOSE: 147 mg/dL — AB (ref 70–99)
POTASSIUM: 4.1 meq/L (ref 3.7–5.3)
Sodium: 140 mEq/L (ref 137–147)

## 2013-12-16 LAB — CBC
HEMATOCRIT: 36.5 % (ref 36.0–46.0)
Hemoglobin: 11.8 g/dL — ABNORMAL LOW (ref 12.0–15.0)
MCH: 30.9 pg (ref 26.0–34.0)
MCHC: 32.3 g/dL (ref 30.0–36.0)
MCV: 95.5 fL (ref 78.0–100.0)
Platelets: 121 10*3/uL — ABNORMAL LOW (ref 150–400)
RBC: 3.82 MIL/uL — ABNORMAL LOW (ref 3.87–5.11)
RDW: 13.7 % (ref 11.5–15.5)
WBC: 8.9 10*3/uL (ref 4.0–10.5)

## 2013-12-16 MED ORDER — SODIUM CHLORIDE 0.9 % IV BOLUS (SEPSIS)
250.0000 mL | Freq: Once | INTRAVENOUS | Status: AC
  Administered 2013-12-16: 250 mL via INTRAVENOUS

## 2013-12-16 MED ORDER — SODIUM CHLORIDE 0.9 % IV SOLN
INTRAVENOUS | Status: AC
  Administered 2013-12-16: 11:00:00 via INTRAVENOUS

## 2013-12-16 NOTE — Progress Notes (Addendum)
Subjective: 1 Day Post-Op Procedure(s) (LRB): RIGHT KNEE POLYETHYLENE  EXCHANGE (Right) Patient reports pain as mild.   Patient seen in rounds with Dr. Wynelle Link. Family in room. Patient is well, but has had some minor complaints of pain in the knee, requiring pain medications We will start therapy today. She is already doing SLR's Plan is to go Home after hospital stay.  Probably home tomorrow.  Objective: Vital signs in last 24 hours: Temp:  [94.5 F (34.7 C)-99 F (37.2 C)] 99 F (37.2 C) (10/01 0415) Pulse Rate:  [48-80] 62 (10/01 0415) Resp:  [12-19] 16 (10/01 0415) BP: (103-170)/(46-84) 121/46 mmHg (10/01 0415) SpO2:  [94 %-100 %] 98 % (10/01 0415) Weight:  [61.689 kg (136 lb)] 61.689 kg (136 lb) (09/30 0927)  Intake/Output from previous day:  Intake/Output Summary (Last 24 hours) at 12/16/13 0919 Last data filed at 12/16/13 0729  Gross per 24 hour  Intake 3657.5 ml  Output   1145 ml  Net 2512.5 ml    Intake/Output this shift: Total I/O In: 391.3 [P.O.:120; I.V.:271.3] Out: -   Labs:  Recent Labs  12/16/13 0412  HGB 11.8*    Recent Labs  12/16/13 0412  WBC 8.9  RBC 3.82*  HCT 36.5  PLT 121*    Recent Labs  12/16/13 0412  NA 140  K 4.1  CL 104  CO2 23  BUN 22  CREATININE 0.94  GLUCOSE 147*  CALCIUM 9.0   No results found for this basename: LABPT, INR,  in the last 72 hours  EXAM General - Patient is Alert, Appropriate and Oriented Extremity - Neurovascular intact Sensation intact distally Dressing - dressing C/D/I Motor Function - intact, moving foot and toes well on exam.  Hemovac pulled without difficulty.  Past Medical History  Diagnosis Date  . Anxiety   . GERD (gastroesophageal reflux disease)   . Depressive disorder   . Osteoporosis   . HTN (hypertension)   . HLD (hyperlipidemia)   . LBBB (left bundle branch block)   . Colon polyps     last colonoscopy when pt was around 61. No polyps reported but she did previously have  small polyps  . PONV (postoperative nausea and vomiting)   . History of pulmonary embolism 2011  W/ THROMBOPHEBITIS-- RESOLVED  . History of GI bleed GASTRIC ULCER MARCH 2013--  BLOOD TRANSFUSION  . Benign head tremor   . Coronary artery disease CARDIOLOGIST- DR CRENSHAW-- LOV IN EPIC MAY 2013    MYOVIEW 08-20-2011--  NO ISCHEMIA-  IN EPIC  . S/P dilatation of esophageal stricture   . Bladder cancer RECURRENT- HX TCC OF BLADDER  . OA (osteoarthritis) HANDS AND JOINTS  . Numbness of fingers   . Carpal tunnel syndrome on both sides   . SUI (stress urinary incontinence, female)   . Hearing loss bilateral hearing aids  . History of non-ST elevation myocardial infarction (NSTEMI) MARCH 2013--   +TROPONINS/  GI BLEED     MEDICAL MANAGEMENT pt states was informed she did not have a MI  . Bronchitis     pt states has yearly  . Pneumonia     hx of   . Pleurisy     hx of   . H/O hiatal hernia     pt denies  . Anemia     2013 transfusion times 3    Assessment/Plan: 1 Day Post-Op Procedure(s) (LRB): RIGHT KNEE POLYETHYLENE  EXCHANGE (Right) Principal Problem:   Failed total knee, right Active Problems:  OA (osteoarthritis) of knee  Estimated body mass index is 27.45 kg/(m^2) as calculated from the following:   Height as of this encounter: 4\' 11"  (1.499 m).   Weight as of this encounter: 61.689 kg (136 lb). Advance diet Up with therapy Plan for discharge tomorrow Discharge home with home health  DVT Prophylaxis - Xarelto Weight-Bearing as tolerated to right leg D/C O2 and Pulse OX and try on Room Air  Addendum: Urine output low since last night and had soft pressures thru the night. Will give bolus and extra fluids at this time.  Recheck BP after bolus. Keep foley for now for strict I&O monitoring. Parameter on Diovan blood pressure medication.  Do not hold beta blocker. Possibly remove foley later today if urine output picks up and increases.  Arlee Muslim,  PA-C Orthopaedic Surgery 12/16/2013, 9:19 AM

## 2013-12-16 NOTE — Evaluation (Signed)
Physical Therapy Evaluation Patient Details Name: Christina [name] MRN: [id number] DOB: 1925/07/04 Today's Date: 12/16/2013   History of Present Illness  RTKA  Clinical Impression  Pt s/p R TKR polyethylene revision presents with decreased R LE strength/ROM and post op pain limiting functional mobility.    Follow Up Recommendations Home health PT    Equipment Recommendations  None recommended by PT    Recommendations for Other Services OT consult     Precautions / Restrictions Precautions Precautions: Fall Required Braces or Orthoses: Knee Immobilizer - Right Knee Immobilizer - Right: Discontinue once straight leg raise with < 10 degree lag (Pt performed IND SLR this am) Restrictions Weight Bearing Restrictions: No Other Position/Activity Restrictions: WBAT      Mobility  Bed Mobility Overal bed mobility: Needs Assistance Bed Mobility: Supine to Sit     Supine to sit: Min assist     General bed mobility comments: pt in chair  Transfers Overall transfer level: Needs assistance Equipment used: Rolling walker (2 wheeled) Transfers: Sit to/from Stand Sit to Stand: Min assist         General transfer comment: verbal cues for hand placement and safety  Ambulation/Gait Ambulation/Gait assistance: Min assist Ambulation Distance (Feet): 100 Feet Assistive device: Rolling walker (2 wheeled) Gait Pattern/deviations: Step-to pattern;Decreased step length - right;Decreased step length - left;Shuffle;Trunk flexed     General Gait Details: cues for posture, sequence and position from [employer]            Wheelchair Mobility    Modified Rankin (Stroke Patients Only)       Balance                                             Pertinent Vitals/Pain Pain Assessment: 0-10 Pain Score: 4  Pain Location: R knee Pain Descriptors / Indicators: Sore Pain Intervention(s): Ice applied;Monitored during session    Home Living Family/patient  expects to be discharged to:: Private residence Living Arrangements: Alone Available Help at Discharge: Family Type of Home: House Home Access: Stairs to enter Entrance Stairs-Rails: Left Entrance Stairs-Number of Steps: 4 Home Layout: One level Home Equipment: Bedside commode;Walker - 4 wheels;Grab bars - tub/shower;Grab bars - toilet;Other (comment) (lift chair)      Prior Function Level of Independence: Independent with assistive device(s)               Hand Dominance        Extremity/Trunk Assessment   Upper Extremity Assessment: Generalized weakness           Lower Extremity Assessment: RLE deficits/detail RLE Deficits / Details: 3/5 quads with AAROM at knee -10 - 80    Cervical / Trunk Assessment: Normal  Communication   Communication: No difficulties  Cognition Arousal/Alertness: Awake/alert Behavior During Therapy: WFL for tasks assessed/performed Overall Cognitive Status: Within Functional Limits for tasks assessed                      General Comments      Exercises Total Joint Exercises Ankle Circles/Pumps: AROM;Both;15 reps;Supine Quad Sets: AROM;Both;10 reps;Supine Heel Slides: AAROM;Supine;15 reps;Right Straight Leg Raises: AAROM;AROM;Right;10 reps;Supine      Assessment/Plan    PT Assessment Patient needs continued PT services  PT Diagnosis Difficulty walking   PT Problem List Decreased strength;Decreased range of motion;Decreased activity tolerance;Decreased mobility;Decreased knowledge of use of DME;Pain  PT Treatment Interventions DME instruction;Gait training;Stair training;Functional mobility training;Therapeutic activities;Therapeutic exercise;Patient/family education   PT Goals (Current goals can be found in the Care Plan section) Acute Rehab PT Goals Patient Stated Goal: get home and back to living alone PT Goal Formulation: With patient Time For Goal Achievement: 12/23/13 Potential to Achieve Goals: Good     Frequency 7X/week   Barriers to discharge        Co-evaluation               End of Session Equipment Utilized During Treatment: Gait belt Activity Tolerance: Patient tolerated treatment well Patient left: in chair;with call bell/phone within reach;with family/visitor present Nurse Communication: Mobility status         Time: 0915-0952 PT Time Calculation (min): 37 min   Charges:   PT Evaluation $Initial PT Evaluation Tier I: 1 Procedure PT Treatments $Gait Training: 8-22 mins $Therapeutic Exercise: 8-22 mins   PT G Codes:          Gordan Grell 12/16/2013, 11:27 AM

## 2013-12-16 NOTE — Discharge Instructions (Addendum)
° °Dr. Frank [name] °Total Joint Specialist °[facility] °[address] °[place], [place] [address] °[phone] ° °TOTAL KNEE REPLACEMENT POSTOPERATIVE DIRECTIONS ° ° ° °Knee Rehabilitation, Guidelines Following Surgery  °Results after knee surgery are often greatly improved when you follow the exercise, range of motion and muscle strengthening exercises prescribed by your doctor. Safety measures are also important to protect the knee from further injury. Any time any of these exercises cause you to have increased pain or swelling in your knee joint, decrease the amount until you are comfortable again and slowly increase them. If you have problems or questions, call your caregiver or physical therapist for advice.  ° °HOME CARE INSTRUCTIONS  °Remove items at home which could result in a fall. This includes throw rugs or furniture in walking pathways.  °Continue medications as instructed at time of discharge. °You may have some home medications which will be placed on hold until you complete the course of blood thinner medication.  °You may start showering once you are discharged home but do not submerge the incision under water. Just pat the incision dry and apply a dry gauze dressing on daily. °Walk with walker as instructed.  °You may resume a sexual relationship in one month or when given the OK by  your doctor.  °· Use walker as long as suggested by your caregivers. °· Avoid periods of inactivity such as sitting longer than an hour when not asleep. This helps prevent blood clots.  °You may put full weight on your legs and walk as much as is comfortable.  °You may return to work once you are cleared by your doctor.  °Do not drive a car for 6 weeks or until released by you surgeon.  °· Do not drive while taking narcotics.  °Wear the elastic stockings for three weeks following surgery during the day but you may remove then at night. °Make sure you keep all of your appointments after your  operation with all of your doctors and caregivers. You should call the office at the above phone number and make an appointment for approximately two weeks after the date of your surgery. °Change the dressing daily and reapply a dry dressing each time. °Please pick up a stool softener and laxative for home use as long as you are requiring pain medications. °· Continue to use ice on the knee for pain and swelling from surgery. You may notice swelling that will progress down to the foot and ankle.  This is normal after surgery.  Elevate the leg when you are not up walking on it.   °It is important for you to complete the blood thinner medication as prescribed by your doctor. °· Continue to use the breathing machine which will help keep your temperature down.  It is common for your temperature to cycle up and down following surgery, especially at night when you are not up moving around and exerting yourself.  The breathing machine keeps your lungs expanded and your temperature down. ° °RANGE OF MOTION AND STRENGTHENING EXERCISES  °Rehabilitation of the knee is important following a knee injury or an operation. After just a few days of immobilization, the muscles of the thigh which control the knee become weakened and shrink (atrophy). Knee exercises are designed to build up the tone and strength of the thigh muscles and to improve knee motion. Often times heat used for twenty to thirty minutes before working out will loosen up your tissues and help with improving the   range of motion but do not use heat for the first two weeks following surgery. These exercises can be done on a training (exercise) mat, on the floor, on a table or on a bed. Use what ever works the best and is most comfortable for you Knee exercises include:  Leg Lifts - While your knee is still immobilized in a splint or cast, you can do straight leg raises. Lift the leg to 60 degrees, hold for 3 sec, and slowly lower the leg. Repeat 10-20 times 2-3  times daily. Perform this exercise against resistance later as your knee gets better.  Quad and Hamstring Sets - Tighten up the muscle on the front of the thigh (Quad) and hold for 5-10 sec. Repeat this 10-20 times hourly. Hamstring sets are done by pushing the foot backward against an object and holding for 5-10 sec. Repeat as with quad sets.  A rehabilitation program following serious knee injuries can speed recovery and prevent re-injury in the future due to weakened muscles. Contact your doctor or a physical therapist for more information on knee rehabilitation.   SKILLED REHAB INSTRUCTIONS: If the patient is transferred to a skilled rehab facility following release from the hospital, a list of the current medications will be sent to the facility for the patient to continue.  When discharged from the skilled rehab facility, please have the facility set up the patient's [place] prior to being released. Also, the skilled facility will be responsible for providing the patient with their medications at time of release from the facility to include their pain medication, the muscle relaxants, and their blood thinner medication. If the patient is still at the rehab facility at time of the two week follow up appointment, the skilled rehab facility will also need to assist the patient in arranging follow up appointment in our office and any transportation needs.  MAKE SURE YOU:  Understand these instructions.  Will watch your condition.  Will get help right away if you are not doing well or get worse.    Pick up stool softner and laxative for home. Do not submerge incision under water. May shower. Continue to use ice for pain and swelling from surgery.  Take Xarelto for two and a half more weeks, then discontinue Xarelto. Once the patient has completed the Xarelto, they may resume the 81 mg Aspirin.  Information on my medicine - XARELTO (Rivaroxaban)  This medication education  was reviewed with me or my healthcare representative as part of my discharge preparation.  The pharmacist that spoke with me during my hospital stay was:  Angela [facility]  Why was Xarelto prescribed for you? Xarelto was prescribed for you to reduce the risk of blood clots forming after orthopedic surgery. The medical term for these abnormal blood clots is venous thromboembolism (VTE).  What do you need to know about xarelto ? Take your Xarelto ONCE DAILY at the same time every day. You may take it either with or without food.  If you have difficulty swallowing the tablet whole, you may crush it and mix in applesauce just prior to taking your dose.  Take Xarelto exactly as prescribed by your doctor and DO NOT stop taking Xarelto without talking to the doctor who prescribed the medication.  Stopping without other VTE prevention medication to take the place of Xarelto may increase your risk of developing a clot.  After discharge, you should have regular check-up appointments with your healthcare provider that is prescribing  your Xarelto.    What do you do if you miss a dose? If you miss a dose, take it as soon as you remember on the same day then continue your regularly scheduled once daily regimen the next day. Do not take two doses of Xarelto on the same day.   Important Safety Information A possible side effect of Xarelto is bleeding. You should call your healthcare provider right away if you experience any of the following:   Bleeding from an injury or your nose that does not stop.   Unusual colored urine (red or dark brown) or unusual colored stools (red or black).   Unusual bruising for unknown reasons.   A serious fall or if you hit your head (even if there is no bleeding).  Some medicines may interact with Xarelto and might increase your risk of bleeding while on Xarelto. To help avoid this, consult your healthcare provider or pharmacist prior to using any new  prescription or non-prescription medications, including herbals, vitamins, non-steroidal anti-inflammatory drugs (NSAIDs) and supplements.  This website has more information on Xarelto: [email or web address]

## 2013-12-16 NOTE — Op Note (Signed)
NAMEMCKINSEY, KEAGLE               ACCOUNT NO.:  [id number]  MEDICAL RECORD NO.:  [id number]  LOCATION:  54                         FACILITY:  [facility]  PHYSICIAN:  Gaynelle Arabian, M.D.    DATE OF BIRTH:  18-Mar-1926  DATE OF PROCEDURE:  12/15/2013 DATE OF DISCHARGE:                              OPERATIVE REPORT   PREOPERATIVE DIAGNOSIS:  Polyethylene failure, right knee.  POSTOPERATIVE DIAGNOSIS:  Polyethylene failure, right knee.  PROCEDURE:  Right knee polyethylene revision.  SURGEON:  Gaynelle Arabian, M.D.  ASSISTANT:  Alexzandrew L. Perkins, P.A.C.  ANESTHESIA:  Spinal.  ESTIMATED BLOOD LOSS:  Minimal.  DRAINS:  Hemovac x1.  COMPLICATIONS:  None.  CONDITION:  Stable to recovery.  BRIEF CLINICAL NOTE:  Ms. Southall is an 80 year old female, who had a total knee arthroplasty done right knee approximately 10 years ago.  She did well and recently has developed discomfort and instability of the knee.  Radiographs showed the polyethylene appears to be worn.  She has not have any definitive loosening.  There are no signs of infection. She presents now for polyethylene versus total knee arthroplasty revision.  PROCEDURE IN DETAIL:  After successful administration of spinal anesthetic, a tourniquet was placed high on her right thigh and her right lower extremity, prepped and draped in usual sterile fashion. Extremities wrapped in Esmarch, knee flexed, and tourniquet inflated to 300 mmHg.  Midline incision was made with a 10-blade through the subcutaneous tissue to the level of the extensor mechanism.  A fresh blade was used to make a medial parapatellar arthrotomy.  Soft tissue on the proximal medial tibia was subperiosteally elevated to the joint line with a knife and into the semimembranosus bursa with a Cobb elevator. There was a fair amount of polyethylene wear debris in the joint and thorough synovectomy was performed to remove this.  We then flexed the knee and we were  able to remove the polyethylene from the tibial tray. I circumferentially inspected the tibial tray and found that it was well fixed.  Similarly, we did the same to the femur and found well fixed. There was a small osteolytic cyst on the medial side just underneath the medial distal aspect of the femur, but not large enough to need bone graft.  Both components were stable, so we decided just to revised the polyethylene.  She had a 10-mm thick polyethylene for a size 5 [employer] [place] knee.  We trialed up to a 15, which allowed for full extension with excellent varus-valgus and anterior-posterior balance throughout full range of motion.  We then placed a 15-mm thick posterior stabilized polyethylene for the size 5 tibial tray.  The polyethylene was snapped into place.  It was locked in place and full extensions achieved with excellent varus-valgus and anterior-posterior balance throughout full range of motion.  Her preop instability was completely corrected.  We inspected the patella and the component was well fixed with minimal of any wire.  It tracked normally.  Wound was then copiously irrigated with saline solution and then the 20 mL of Exparel mixed with 40 mL of saline injected into the periosteum of the femur, the extensor mechanism and the subcu tissue.  Additional  20 mL of 0.25% Marcaine was injected into the same tissues.  The arthrotomy was then closed over Hemovac drain with running #1 V-Loc suture.  Flexion against gravity was 130 degrees. Patella tracked normally.  Tourniquet was released.  Total tourniquet time was 21 minutes.  Minor bleeding was stopped with cautery.  Subcu was closed with interrupted 2-0 Vicryl and subcuticular running 4-0 Monocryl.  The drain was hooked to suction.  Incision was cleaned and dried, and Steri-Strips and a bulky sterile dressing applied.  She was then placed into a knee immobilizer, awakened and transported to the recovery room in stable  condition.  Note that the surgical assistant was a medical necessity for this procedure to do it in a safe and expeditious manner.  Surgical assistant was necessary for retraction of vital ligaments and neurovascular structures, and also proper positioning of the limb to safely remove the old prosthesis and placed a new one.     Gaynelle Arabian, M.D.     FA/MEDQ  D:  12/15/2013  T:  12/16/2013  Job:  [id number]

## 2013-12-16 NOTE — Care Management Note (Signed)
    Page 1 of 2   12/16/2013     8:32:51 AM CARE MANAGEMENT NOTE 12/16/2013  Patient:  Christina [name], Christina [name]   Account Number:  [id number]  Date Initiated:  12/16/2013  Documentation initiated by:  [facility]  Subjective/Objective Assessment:   [id number]:[id number] knee polyethylene revision.     Action/Plan:   discharge planning   Anticipated DC Date:  12/16/2013   Anticipated DC Plan:  [place]  CM consult      [facility] Choice  HOME HEALTH   Choice offered to / List presented to:  C-1 Patient   DME arranged  NA      DME agency  NA     [place] arranged  HH-2 PT      [place]   Status of service:  Completed, signed off Medicare Important Message given?   (If response is "NO", the following Medicare IM given date fields will be blank) Date Medicare IM given:   Medicare IM given by:   Date Additional Medicare IM given:   Additional Medicare IM given by:    Discharge Disposition:  [place]  Per UR Regulation:    If discussed at Long Length of Stay Meetings, dates discussed:    Comments:  12/16/13 07:50 Cm met with pt and daughter, Jackelyn Poling shoe [phone] in room to offer choice.  Pt chooses [facility] as she had them for previous surgery.  Pt does NOT need any DME as she still has a rolling walker and 3n1 from previous sx.  Jackelyn Poling will be staying with pt while pt is recuperating.  Address and contact information verified with home number being best contact (facesheet).  CM called [facility] rep with referral.  No other CM needs were communicated.  Mariane Masters, BSN, CM [phone].

## 2013-12-16 NOTE — Progress Notes (Signed)
Physical Therapy Treatment Patient Details Name: Christina [name] MRN: [id number] DOB: Jun 24, 1925 Today's Date: 12/16/2013    History of Present Illness RIGHT KNEE POLYETHYLENE  EXCHANGE     PT Comments      Follow Up Recommendations  Home health PT     Equipment Recommendations  None recommended by PT    Recommendations for Other Services OT consult     Precautions / Restrictions Precautions Precautions: Fall Required Braces or Orthoses: Knee Immobilizer - Right Knee Immobilizer - Right: Discontinue once straight leg raise with < 10 degree lag (Pt performed IND SLR this am) Restrictions Weight Bearing Restrictions: No Other Position/Activity Restrictions: WBAT    Mobility  Bed Mobility Overal bed mobility: Needs Assistance Bed Mobility: Supine to Sit;Sit to Supine     Supine to sit: Min assist Sit to supine: Min assist   General bed mobility comments: min assist with R LE  Transfers Overall transfer level: Needs assistance Equipment used: Rolling walker (2 wheeled) Transfers: Sit to/from Stand Sit to Stand: Min assist         General transfer comment: verbal cues for hand placement and safety  Ambulation/Gait Ambulation/Gait assistance: Min assist Ambulation Distance (Feet): 89 Feet (twice) Assistive device: Rolling walker (2 wheeled) Gait Pattern/deviations: Step-to pattern;Step-through pattern;Decreased step length - right;Decreased step length - left;Shuffle;Trunk flexed     General Gait Details: cues for posture, sequence and position from [employer]            Wheelchair Mobility    Modified Rankin (Stroke Patients Only)       Balance                                    Cognition Arousal/Alertness: Awake/alert Behavior During Therapy: WFL for tasks assessed/performed Overall Cognitive Status: Within Functional Limits for tasks assessed                      Exercises      General Comments         Pertinent Vitals/Pain Pain Assessment: 0-10 Pain Score: 5  Pain Location: R knee Pain Descriptors / Indicators: Aching;Sore Pain Intervention(s): Limited activity within patient's tolerance;Premedicated before session;Monitored during session;Ice applied    Home Living Family/patient expects to be discharged to:: Private residence Living Arrangements: Alone Available Help at Discharge: Family Type of Home: House Home Access: Stairs to enter Entrance Stairs-Rails: Left Home Layout: One level Home Equipment: Bedside commode;Walker - 4 wheels;Grab bars - tub/shower;Grab bars - toilet;Other (comment) (lift chair)      Prior Function            PT Goals (current goals can now be found in the care plan section) Acute Rehab PT Goals Patient Stated Goal: get home and back to living alone PT Goal Formulation: With patient Time For Goal Achievement: 12/23/13 Potential to Achieve Goals: Good Progress towards PT goals: Progressing toward goals    Frequency  7X/week    PT Plan Current plan remains appropriate    Co-evaluation             End of Session Equipment Utilized During Treatment: Gait belt Activity Tolerance: Patient tolerated treatment well Patient left: in bed;with call bell/phone within reach;with family/visitor present     Time: 1433-1500 PT Time Calculation (min): 27 min  Charges:  $Gait Training: 23-37 mins  G Codes:      Christina [name] Jan 11, 2014, 3:14 PM

## 2013-12-16 NOTE — Evaluation (Signed)
Occupational Therapy Evaluation Patient Details Name: Christina [name] MRN: [id number] DOB: 1925-08-22 Today's Date: 12/16/2013    History of Present Illness RIGHT KNEE POLYETHYLENE  EXCHANGE    Clinical Impression   Pt is s/p knee surgery resulting in the deficits listed below (see OT Problem List). Pt will benefit from skilled OT to increase their safety and independence with ADL and functional mobility for ADL to facilitate discharge to venue listed below.      Follow Up Recommendations  Home health OT;Supervision/Assistance - 24 hour    Equipment Recommendations  None recommended by OT       Precautions / Restrictions Precautions Precautions: Fall Required Braces or Orthoses: Knee Immobilizer - Right Knee Immobilizer - Right: Discontinue once straight leg raise with < 10 degree lag (Pt performed IND SLR this am) Restrictions Weight Bearing Restrictions: No Other Position/Activity Restrictions: WBAT      Mobility Bed Mobility Overal bed mobility: Needs Assistance Bed Mobility: Supine to Sit     Supine to sit: Min assist     General bed mobility comments: pt in chair  Transfers Overall transfer level: Needs assistance Equipment used: Rolling walker (2 wheeled) Transfers: Sit to/from Stand Sit to Stand: Min assist         General transfer comment: verbal cues for hand placement and safety         ADL Overall ADL's : Needs assistance/impaired Eating/Feeding: Set up;Sitting   Grooming: Set up;Sitting   Upper Body Bathing: Set up;Sitting   Lower Body Bathing: Moderate assistance;Sit to/from stand   Upper Body Dressing : Set up;Sitting   Lower Body Dressing: Moderate assistance;Sit to/from stand   Toilet Transfer: Minimal assistance Armed forces technical officer Details (indicate cue type and reason): sit to stand only Toileting- Water quality scientist and Hygiene: Sit to/from stand;Minimal assistance         General ADL Comments: daugther will provide A with  ADL activity post DC               Pertinent Vitals/Pain Pain Assessment: 0-10 Pain Score: 4  Pain Location: R knee Pain Descriptors / Indicators: Sore Pain Intervention(s): Ice applied;Monitored during session        Extremity/Trunk Assessment Upper Extremity Assessment Upper Extremity Assessment: Generalized weakness   Lower Extremity Assessment Lower Extremity Assessment: RLE deficits/detail RLE Deficits / Details: 3/5 quads with AAROM at knee -10 - 80   Cervical / Trunk Assessment Cervical / Trunk Assessment: Normal   Communication Communication Communication: No difficulties   Cognition Arousal/Alertness: Awake/alert Behavior During Therapy: WFL for tasks assessed/performed Overall Cognitive Status: Within Functional Limits for tasks assessed                     General Comments   daughter will provide A as needed            Home Living Family/patient expects to be discharged to:: Private residence Living Arrangements: Alone Available Help at Discharge: Family Type of Home: House Home Access: Stairs to enter Technical brewer of Steps: 4 Entrance Stairs-Rails: Left Home Layout: One level     Bathroom Shower/Tub: Tub/shower unit;Walk-in shower;Door   Bathroom Toilet: Handicapped height     Home Equipment: Bedside commode;Walker - 4 wheels;Grab bars - tub/shower;Grab bars - toilet;Other (comment) (lift chair)          Prior Functioning/Environment Level of Independence: Independent with assistive device(s)             OT Diagnosis: Generalized weakness;Acute pain  OT Problem List: Decreased strength;Pain   OT Treatment/Interventions: Self-care/ADL training;Patient/family education;DME and/or AE instruction    OT Goals(Current goals can be found in the care plan section) Acute Rehab OT Goals Patient Stated Goal: get home and back to living alone OT Goal Formulation: With patient Time For Goal Achievement:  12/30/13 Potential to Achieve Goals: Good ADL Goals Pt Will Perform Grooming: with supervision;standing Pt Will Perform Lower Body Dressing: with supervision;sit to/from stand Pt Will Transfer to Toilet: with supervision;ambulating;regular height toilet Pt Will Perform Toileting - Clothing Manipulation and hygiene: with supervision;sit to/from stand  OT Frequency: Min 2X/week              End of Session Nurse Communication: Mobility status  Activity Tolerance: Patient tolerated treatment well Patient left: in chair;with call bell/phone within reach;with family/visitor present   Time: 7106-2694 OT Time Calculation (min): 35 min Charges:  OT General Charges $OT Visit: 1 Procedure OT Evaluation $Initial OT Evaluation Tier I: 1 Procedure OT Treatments $Self Care/Home Management : 23-37 mins G-Codes:    Payton Mccallum D Dec 28, 2013, 11:29 AM

## 2013-12-17 MED ORDER — TRAMADOL HCL 50 MG PO TABS: 50.0000 mg | ORAL_TABLET | Freq: Four times a day (QID) | ORAL | Status: DC | PRN

## 2013-12-17 MED ORDER — RIVAROXABAN 10 MG PO TABS: 10.0000 mg | ORAL_TABLET | Freq: Every day | ORAL | Status: DC

## 2013-12-17 MED ORDER — METHOCARBAMOL 500 MG PO TABS: 500.0000 mg | ORAL_TABLET | Freq: Four times a day (QID) | ORAL | Status: DC | PRN

## 2013-12-17 MED ORDER — OXYCODONE HCL 5 MG PO TABS: 5.0000 mg | ORAL_TABLET | ORAL | Status: DC | PRN

## 2013-12-17 NOTE — Progress Notes (Signed)
Physical Therapy Treatment Patient Details Name: Christina [name] MRN: [id number] DOB: 04-21-1925 Today's Date: 12/17/2013    History of Present Illness RIGHT KNEE POLYETHYLENE  EXCHANGE     PT Comments    Progressing well and eager for d/c home  Follow Up Recommendations  Home health PT     Equipment Recommendations  None recommended by PT    Recommendations for Other Services OT consult     Precautions / Restrictions Precautions Precautions: Fall Required Braces or Orthoses: Knee Immobilizer - Right Knee Immobilizer - Right: Discontinue once straight leg raise with < 10 degree lag Restrictions Weight Bearing Restrictions: No Other Position/Activity Restrictions: WBAT    Mobility  Bed Mobility Overal bed mobility: Needs Assistance Bed Mobility: Supine to Sit     Supine to sit: Supervision     General bed mobility comments: pt sitting up in recliner when OT arrived.   Transfers Overall transfer level: Needs assistance Equipment used: Rolling walker (2 wheeled) Transfers: Sit to/from Stand Sit to Stand: Min guard         General transfer comment: Good hand placement and technique. Min guard for safety.   Ambulation/Gait Ambulation/Gait assistance: Min guard Ambulation Distance (Feet): 180 Feet Assistive device: Rolling walker (2 wheeled) Gait Pattern/deviations: Step-to pattern;Step-through pattern;Decreased step length - right;Decreased step length - left;Shuffle;Trunk flexed     General Gait Details: cues for posture, sequence and position from RW   Stairs Stairs: Yes Stairs assistance: Min assist Stair Management: One rail Left;Step to pattern;With cane Number of Stairs: 3 General stair comments: Cues for sequence and foot/cane placement  Wheelchair Mobility    Modified Rankin (Stroke Patients Only)       Balance                                    Cognition Arousal/Alertness: Awake/alert Behavior During Therapy: WFL  for tasks assessed/performed Overall Cognitive Status: Within Functional Limits for tasks assessed                      Exercises Total Joint Exercises Ankle Circles/Pumps: AROM;Both;15 reps;Supine Quad Sets: AROM;Both;Supine;15 reps Heel Slides: AAROM;Supine;Right;20 reps Straight Leg Raises: AAROM;AROM;Right;Supine;20 reps Goniometric ROM: AAROM R knee -8 - 90    General Comments        Pertinent Vitals/Pain Pain Assessment: No/denies pain Pain Score: 4  Pain Location: R knee Pain Descriptors / Indicators: Sore Pain Intervention(s): Limited activity within patient's tolerance;Monitored during session;Premedicated before session;Ice applied    Home Living                      Prior Function            PT Goals (current goals can now be found in the care plan section) Acute Rehab PT Goals Patient Stated Goal: To return and stay as independent as possible PT Goal Formulation: With patient Time For Goal Achievement: 12/23/13 Potential to Achieve Goals: Good Progress towards PT goals: Progressing toward goals    Frequency  7X/week    PT Plan Current plan remains appropriate    Co-evaluation             End of Session Equipment Utilized During Treatment: Gait belt Activity Tolerance: Patient tolerated treatment well Patient left: in chair;with call bell/phone within reach;with family/visitor present     Time: 6962-9528 PT Time Calculation (min): 38 min  Charges:  $  Gait Training: 23-37 mins $Therapeutic Exercise: 8-22 mins                    G Codes:      Brit Wernette 2014-01-01, 12:21 PM

## 2013-12-17 NOTE — Progress Notes (Signed)
Subjective: 2 Days Post-Op Procedure(s) (LRB): RIGHT KNEE POLYETHYLENE  EXCHANGE (Right) Patient reports pain as mild.   Patient seen in rounds with Dr. Wynelle Link. Family in room at bedside. Patient is well, and has had no acute complaints or problems Patient is ready to go home today after therapy.  Objective: Vital signs in last 24 hours: Temp:  [98 F (36.7 C)-98.3 F (36.8 C)] 98.2 F (36.8 C) (10/02 0535) Pulse Rate:  [56-66] 56 (10/02 0535) Resp:  [15-16] 15 (10/02 0535) BP: (119-146)/(49-85) 141/65 mmHg (10/02 0535) SpO2:  [96 %-98 %] 98 % (10/02 0535)  Intake/Output from previous day:  Intake/Output Summary (Last 24 hours) at 12/17/13 0853 Last data filed at 12/17/13 0536  Gross per 24 hour  Intake 2303.75 ml  Output   2000 ml  Net 303.75 ml    Intake/Output this shift:    Labs:  Recent Labs  12/16/13 0412  HGB 11.8*    Recent Labs  12/16/13 0412  WBC 8.9  RBC 3.82*  HCT 36.5  PLT 121*    Recent Labs  12/16/13 0412  NA 140  K 4.1  CL 104  CO2 23  BUN 22  CREATININE 0.94  GLUCOSE 147*  CALCIUM 9.0   No results found for this basename: LABPT, INR,  in the last 72 hours  EXAM: General - Patient is Alert, Appropriate and Oriented Extremity - Neurovascular intact Sensation intact distally Incision - clean, dry, no drainage, healing Motor Function - intact, moving foot and toes well on exam.   Assessment/Plan: 2 Days Post-Op Procedure(s) (LRB): RIGHT KNEE POLYETHYLENE  EXCHANGE (Right) Procedure(s) (LRB): RIGHT KNEE POLYETHYLENE  EXCHANGE (Right) Past Medical History  Diagnosis Date  . Anxiety   . GERD (gastroesophageal reflux disease)   . Depressive disorder   . Osteoporosis   . HTN (hypertension)   . HLD (hyperlipidemia)   . LBBB (left bundle branch block)   . Colon polyps     last colonoscopy when pt was around 70. No polyps reported but she did previously have small polyps  . PONV (postoperative nausea and vomiting)   .  History of pulmonary embolism 2011  W/ THROMBOPHEBITIS-- RESOLVED  . History of GI bleed GASTRIC ULCER MARCH 2013--  BLOOD TRANSFUSION  . Benign head tremor   . Coronary artery disease CARDIOLOGIST- DR CRENSHAW-- LOV IN EPIC MAY 2013    MYOVIEW 08-20-2011--  NO ISCHEMIA-  IN EPIC  . S/P dilatation of esophageal stricture   . Bladder cancer RECURRENT- HX TCC OF BLADDER  . OA (osteoarthritis) HANDS AND JOINTS  . Numbness of fingers   . Carpal tunnel syndrome on both sides   . SUI (stress urinary incontinence, female)   . Hearing loss bilateral hearing aids  . History of non-ST elevation myocardial infarction (NSTEMI) MARCH 2013--   +TROPONINS/  GI BLEED     MEDICAL MANAGEMENT pt states was informed she did not have a MI  . Bronchitis     pt states has yearly  . Pneumonia     hx of   . Pleurisy     hx of   . H/O hiatal hernia     pt denies  . Anemia     2013 transfusion times 3   Principal Problem:   Failed total knee, right Active Problems:   OA (osteoarthritis) of knee  Estimated body mass index is 27.45 kg/(m^2) as calculated from the following:   Height as of this encounter: 4\' 11"  (1.499 m).  Weight as of this encounter: 61.689 kg (136 lb). Up with therapy Discharge home with home health Diet - Cardiac diet Follow up - in 2 weeks Activity - WBAT Disposition - Home Condition Upon Discharge - Good D/C Meds - See DC Summary DVT Prophylaxis - Xarelto  Arlee Muslim, PA-C Orthopaedic Surgery 12/17/2013, 8:53 AM

## 2013-12-17 NOTE — Discharge Summary (Signed)
Physician Discharge Summary   Patient ID: Christina [name] MRN: [id number] DOB/AGE: 1925-05-25 80 y.o.  Admit date: 12/15/2013 Discharge date: 12/17/2013  Primary Diagnosis:  Polyethylene failure, right knee.  Admission Diagnoses:  Past Medical History  Diagnosis Date  . Anxiety   . GERD (gastroesophageal reflux disease)   . Depressive disorder   . Osteoporosis   . HTN (hypertension)   . HLD (hyperlipidemia)   . LBBB (left bundle branch block)   . Colon polyps     last colonoscopy when pt was around 22. No polyps reported but she did previously have small polyps  . PONV (postoperative nausea and vomiting)   . History of pulmonary embolism 2011  W/ THROMBOPHEBITIS-- RESOLVED  . History of GI bleed GASTRIC ULCER MARCH 2013--  BLOOD TRANSFUSION  . Benign head tremor   . Coronary artery disease CARDIOLOGIST- DR CRENSHAW-- LOV IN EPIC MAY 2013    MYOVIEW 08-20-2011--  NO ISCHEMIA-  IN EPIC  . S/P dilatation of esophageal stricture   . Bladder cancer RECURRENT- HX TCC OF BLADDER  . OA (osteoarthritis) HANDS AND JOINTS  . Numbness of fingers   . Carpal tunnel syndrome on both sides   . SUI (stress urinary incontinence, female)   . Hearing loss bilateral hearing aids  . History of non-ST elevation myocardial infarction (NSTEMI) MARCH 2013--   +TROPONINS/  GI BLEED     MEDICAL MANAGEMENT pt states was informed she did not have a MI  . Bronchitis     pt states has yearly  . Pneumonia     hx of   . Pleurisy     hx of   . H/O hiatal hernia     pt denies  . Anemia     2013 transfusion times 3   Discharge Diagnoses:   Principal Problem:   Failed total knee, right Active Problems:   OA (osteoarthritis) of knee  Estimated body mass index is 27.45 kg/(m^2) as calculated from the following:   Height as of this encounter: 4' 11" (1.499 m).   Weight as of this encounter: 61.689 kg (136 lb).  Procedure:  Procedure(s) (LRB): RIGHT KNEE POLYETHYLENE  EXCHANGE (Right)   Consults:  None  HPI: Christina [name] is an 80 year old female, who had a  total knee arthroplasty done right knee approximately 10 years ago. She  did well and recently has developed discomfort and instability of the  knee. Radiographs showed the polyethylene appears to be worn. She has  not have any definitive loosening. There are no signs of infection.  She presents now for polyethylene versus total knee arthroplasty  revision.  Laboratory Data: Admission on 12/15/2013  Component Date Value Ref Range Status  . WBC 12/16/2013 8.9  4.0 - 10.5 K/uL Final   WHITE COUNT CONFIRMED ON SMEAR  . RBC 12/16/2013 3.82* 3.87 - 5.11 MIL/uL Final  . Hemoglobin 12/16/2013 11.8* 12.0 - 15.0 g/dL Final  . HCT 12/16/2013 36.5  36.0 - 46.0 % Final  . MCV 12/16/2013 95.5  78.0 - 100.0 fL Final  . MCH 12/16/2013 30.9  26.0 - 34.0 pg Final  . MCHC 12/16/2013 32.3  30.0 - 36.0 g/dL Final  . RDW 12/16/2013 13.7  11.5 - 15.5 % Final  . Platelets 12/16/2013 121* 150 - 400 K/uL Final  . Sodium 12/16/2013 140  137 - 147 mEq/L Final  . Potassium 12/16/2013 4.1  3.7 - 5.3 mEq/L Final  . Chloride 12/16/2013 104  96 - 112 mEq/L Final  .  CO2 12/16/2013 23  19 - 32 mEq/L Final  . Glucose, Bld 12/16/2013 147* 70 - 99 mg/dL Final  . BUN 12/16/2013 22  6 - 23 mg/dL Final  . Creatinine, Ser 12/16/2013 0.94  0.50 - 1.10 mg/dL Final  . Calcium 12/16/2013 9.0  8.4 - 10.5 mg/dL Final  . GFR calc non Af Amer 12/16/2013 54* >90 mL/min Final  . GFR calc Af Amer 12/16/2013 62* >90 mL/min Final   Comment: (NOTE)                          The eGFR has been calculated using the CKD EPI equation.                          This calculation has not been validated in all clinical situations.                          eGFR's persistently <90 mL/min signify possible Chronic Kidney                          Disease.  Georgiann Hahn gap 12/16/2013 13  5 - 15 Final  Hospital Outpatient Visit on 12/09/2013  Component Date Value Ref Range Status  . aPTT  12/09/2013 27  24 - 37 seconds Final  . WBC 12/09/2013 5.0  4.0 - 10.5 K/uL Final  . RBC 12/09/2013 4.13  3.87 - 5.11 MIL/uL Final  . Hemoglobin 12/09/2013 13.2  12.0 - 15.0 g/dL Final  . HCT 12/09/2013 39.4  36.0 - 46.0 % Final  . MCV 12/09/2013 95.4  78.0 - 100.0 fL Final  . MCH 12/09/2013 32.0  26.0 - 34.0 pg Final  . MCHC 12/09/2013 33.5  30.0 - 36.0 g/dL Final  . RDW 12/09/2013 13.7  11.5 - 15.5 % Final  . Platelets 12/09/2013 122* 150 - 400 K/uL Final  . Sodium 12/09/2013 140  137 - 147 mEq/L Final  . Potassium 12/09/2013 5.0  3.7 - 5.3 mEq/L Final  . Chloride 12/09/2013 100  96 - 112 mEq/L Final  . CO2 12/09/2013 26  19 - 32 mEq/L Final  . Glucose, Bld 12/09/2013 95  70 - 99 mg/dL Final  . BUN 12/09/2013 17  6 - 23 mg/dL Final  . Creatinine, Ser 12/09/2013 0.97  0.50 - 1.10 mg/dL Final  . Calcium 12/09/2013 10.3  8.4 - 10.5 mg/dL Final  . Total Protein 12/09/2013 7.6  6.0 - 8.3 g/dL Final  . Albumin 12/09/2013 3.9  3.5 - 5.2 g/dL Final  . AST 12/09/2013 32  0 - 37 U/L Final  . ALT 12/09/2013 17  0 - 35 U/L Final  . Alkaline Phosphatase 12/09/2013 32* 39 - 117 U/L Final  . Total Bilirubin 12/09/2013 0.5  0.3 - 1.2 mg/dL Final  . GFR calc non Af Amer 12/09/2013 52* >90 mL/min Final  . GFR calc Af Amer 12/09/2013 60* >90 mL/min Final   Comment: (NOTE)                          The eGFR has been calculated using the CKD EPI equation.                          This calculation has not been validated in all clinical situations.  eGFR's persistently <90 mL/min signify possible Chronic Kidney                          Disease.  . Anion gap 12/09/2013 14  5 - 15 Final  . Prothrombin Time 12/09/2013 14.2  11.6 - 15.2 seconds Final  . INR 12/09/2013 1.10  0.00 - 1.49 Final  . ABO/RH(D) 12/09/2013 A POS   Final  . Antibody Screen 12/09/2013 NEG   Final  . Sample Expiration 12/09/2013 12/18/2013   Final  . Color, Urine 12/09/2013 YELLOW  YELLOW Final  . APPearance  12/09/2013 CLEAR  CLEAR Final  . Specific Gravity, Urine 12/09/2013 1.007  1.005 - 1.030 Final  . pH 12/09/2013 5.5  5.0 - 8.0 Final  . Glucose, UA 12/09/2013 NEGATIVE  NEGATIVE mg/dL Final  . Hgb urine dipstick 12/09/2013 NEGATIVE  NEGATIVE Final  . Bilirubin Urine 12/09/2013 NEGATIVE  NEGATIVE Final  . Ketones, ur 12/09/2013 NEGATIVE  NEGATIVE mg/dL Final  . Protein, ur 12/09/2013 NEGATIVE  NEGATIVE mg/dL Final  . Urobilinogen, UA 12/09/2013 1.0  0.0 - 1.0 mg/dL Final  . Nitrite 12/09/2013 NEGATIVE  NEGATIVE Final  . Leukocytes, UA 12/09/2013 NEGATIVE  NEGATIVE Final   MICROSCOPIC NOT DONE ON URINES WITH NEGATIVE PROTEIN, BLOOD, LEUKOCYTES, NITRITE, OR GLUCOSE <1000 mg/dL.  Marland Kitchen MRSA, PCR 12/09/2013 NEGATIVE  NEGATIVE Final  . Staphylococcus aureus 12/09/2013 NEGATIVE  NEGATIVE Final   Comment:                                 The Xpert SA Assay (FDA                          approved for NASAL specimens                          in patients over 36 years of age),                          is one component of                          a comprehensive surveillance                          program.  Test performance has                          been validated by [employer] for patients greater                          than or equal to 44 year old.                          It is not intended                          to diagnose infection nor to  guide or monitor treatment.     X-Rays:Dg Chest 2 View  12/09/2013   CLINICAL DATA:  Preoperative evaluation for RIGHT knee surgery, history hypertension vomiting GERD, coronary artery disease, bladder cancer  EXAM: CHEST  2 VIEW  COMPARISON:  05/23/2011  FINDINGS: Enlargement of cardiac silhouette.  Calcified mildly tortuous thoracic aorta.  Large hiatal hernia.  Pulmonary vascularity normal.  Lungs clear.  No pleural effusion or pneumothorax.  LEFT shoulder prosthesis with RIGHT glenohumeral  degenerative changes noted.  Osseous demineralization.  IMPRESSION: Enlargement of cardiac silhouette.  Large hiatal hernia.  No acute abnormalities.   Electronically Signed   By: Lavonia Dana M.D.   On: 12/09/2013 15:38    EKG: Orders placed in visit on 12/23/12  . EKG 12-LEAD     Hospital Course: TONIE ELSEY is a 80 y.o. who was admitted to [facility]. They were brought to the operating room on 12/15/2013 and underwent Procedure(s): RIGHT KNEE POLYETHYLENE  EXCHANGE.  Patient tolerated the procedure well and was later transferred to the recovery room and then to the orthopaedic floor for postoperative care.  They were given PO and IV analgesics for pain control following their surgery.  They were given 24 hours of postoperative antibiotics of  Anti-infectives   Start     Dose/Rate Route Frequency Ordered Stop   12/15/13 1800  ceFAZolin (ANCEF) IVPB 2 g/50 mL premix     2 g 100 mL/hr over 30 Minutes Intravenous Every 6 hours 12/15/13 1524 12/16/13 0130   12/15/13 0912  ceFAZolin (ANCEF) IVPB 2 g/50 mL premix     2 g 100 mL/hr over 30 Minutes Intravenous On call to O.R. 12/15/13 0912 12/15/13 1200     and started on DVT prophylaxis in the form of Xarelto.   PT and OT were ordered for total joint protocol.  Discharge planning consulted to help with postop disposition and equipment needs.  Patient had a decent night on the evening of surgery.  They started to get up OOB with therapy on day one. Hemovac drain was pulled without difficulty. Urine output low since the night of surgery and had soft pressures thru the night. Give bolus and extra fluids at that time. Rechecked BP after bolus.  Kept foley on day one for now for strict I&O monitoring.  Parameter on Diovan blood pressure medication. Did not hold beta blocker. Continued to work with therapy into day two.  Dressing was changed on day two and the incision was healing well. Urine output increased.  DC'd foley.  Patient was seen in  rounds on day two by Dr. Wynelle Link and was ready to go home.  Discharge home with home health  Diet - Cardiac diet  Follow up - in 2 weeks on Thursday October 15th Activity - WBAT  Disposition - Home  Condition Upon Discharge - Good  D/C Meds - See DC Summary  DVT Prophylaxis - Xarelto  Discharge Instructions   Call MD / Call 911    Complete by:  As directed   If you experience chest pain or shortness of breath, CALL 911 and be transported to the hospital emergency room.  If you develope a fever above 101 F, pus (white drainage) or increased drainage or redness at the wound, or calf pain, call your surgeon's office.     Change dressing    Complete by:  As directed   Change dressing daily with sterile 4 x 4 inch gauze dressing and apply TED hose. Do  not submerge the incision under water.     Constipation Prevention    Complete by:  As directed   Drink plenty of fluids.  Prune juice may be helpful.  You may use a stool softener, such as Colace (over the counter) 100 mg twice a day.  Use MiraLax (over the counter) for constipation as needed.     Diet - low sodium heart healthy    Complete by:  As directed      Discharge instructions    Complete by:  As directed   Pick up stool softner and laxative for home. Do not submerge incision under water. May shower. Continue to use ice for pain and swelling from surgery.  Take Xarelto for two and a half more weeks, then discontinue Xarelto. Once the patient has completed the Xarelto, they may resume the 81 mg Aspirin.     Do not put a pillow under the knee. Place it under the heel.    Complete by:  As directed      Do not sit on low chairs, stoools or toilet seats, as it may be difficult to get up from low surfaces    Complete by:  As directed      Driving restrictions    Complete by:  As directed   No driving until released by the physician.     Increase activity slowly as tolerated    Complete by:  As directed      Lifting restrictions     Complete by:  As directed   No lifting until released by the physician.     Patient may shower    Complete by:  As directed   You may shower without a dressing once there is no drainage.  Do not wash over the wound.  If drainage remains, do not shower until drainage stops.     TED hose    Complete by:  As directed   Use stockings (TED hose) for 3 weeks on both leg(s).  You may remove them at night for sleeping.     Weight bearing as tolerated    Complete by:  As directed             Medication List    STOP taking these medications       aspirin 81 MG tablet     cephALEXin 500 MG capsule  Commonly known as:  KEFLEX     cholecalciferol 1000 UNITS tablet  Commonly known as:  VITAMIN D     Fish Oil 1200 MG Caps     ibandronate 150 MG tablet  Commonly known as:  BONIVA     pyridOXINE 100 MG tablet  Commonly known as:  VITAMIN B-6     vitamin C 1000 MG tablet      TAKE these medications       ALPRAZolam 0.5 MG tablet  Commonly known as:  XANAX  Take 0.5 mg by mouth 2 (two) times daily as needed (tremors).     diphenhydrAMINE 25 MG tablet  Commonly known as:  SOMINEX  Take 25 mg by mouth at bedtime as needed for sleep.     hydrochlorothiazide 12.5 MG capsule  Commonly known as:  MICROZIDE  Take 12.5 mg by mouth every morning.     methocarbamol 500 MG tablet  Commonly known as:  ROBAXIN  Take 1 tablet (500 mg total) by mouth every 6 (six) hours as needed for muscle spasms.     metoprolol tartrate 25  MG tablet  Commonly known as:  LOPRESSOR  Take 25 mg by mouth 2 (two) times daily.     omeprazole 20 MG capsule  Commonly known as:  PRILOSEC  Take 20 mg by mouth every morning.     oxyCODONE 5 MG immediate release tablet  Commonly known as:  Oxy IR/ROXICODONE  Take 1 tablet (5 mg total) by mouth every 3 (three) hours as needed for moderate pain, severe pain or breakthrough pain.     rivaroxaban 10 MG Tabs tablet  Commonly known as:  XARELTO  - Take 1 tablet  (10 mg total) by mouth daily with breakfast. Take Xarelto for two and a half more weeks, then discontinue Xarelto.  - Once the patient has completed the Xarelto, they may resume the 81 mg Aspirin.     rosuvastatin 20 MG tablet  Commonly known as:  CRESTOR  Take 20 mg by mouth at bedtime.     traMADol 50 MG tablet  Commonly known as:  ULTRAM  Take 1-2 tablets (50-100 mg total) by mouth every 6 (six) hours as needed (mild pain).     valsartan 320 MG tablet  Commonly known as:  DIOVAN  Take 320 mg by mouth every morning.           Follow-up Information   Follow up with [facility] (home health physical therapy)    Contact information:   [address] [place] [address] [phone]       Follow up with Gearlean Alf, MD. Schedule an appointment as soon as possible for a visit on 12/30/2013. (Call office at [phone] to set up follow up appointment on Thursday October 15th.)    Specialty:  Orthopedic Surgery   Contact information:   [address] [place] [address] [phone]       Signed: Arlee Muslim, PA-C Orthopaedic Surgery 12/17/2013, 9:07 AM

## 2013-12-17 NOTE — Progress Notes (Signed)
Occupational Therapy Treatment Patient Details Name: Christina [name] MRN: [id number] DOB: 03/08/1926 Today's Date: 12/17/2013    History of present illness RIGHT KNEE POLYETHYLENE  EXCHANGE    OT comments  Pt seen today to practice ADLs using compensatory techniques. Pt and daughter educated and completed training for LB ADLs and safe functional mobility. Pt is safe for d/c from OT standpoint.    Follow Up Recommendations  Home health OT;Supervision/Assistance - 24 hour    Equipment Recommendations  None recommended by OT    Recommendations for Other Services      Precautions / Restrictions Precautions Precautions: Fall Required Braces or Orthoses: Knee Immobilizer - Right Knee Immobilizer - Right: Discontinue once straight leg raise with < 10 degree lag Restrictions Weight Bearing Restrictions: No Other Position/Activity Restrictions: WBAT       Mobility Bed Mobility               General bed mobility comments: pt sitting up in recliner when OT arrived.   Transfers Overall transfer level: Needs assistance Equipment used: Rolling walker (2 wheeled) Transfers: Sit to/from Stand Sit to Stand: Min guard         General transfer comment: Good hand placement and technique. Min guard for safety.         ADL Overall ADL's : Needs assistance/impaired     Grooming: Min guard;Standing           Upper Body Dressing : Set up;Sitting   Lower Body Dressing: Moderate assistance;Sit to/from stand;With caregiver independent assisting (pt's daughter assisted and instructed on donning KI)   Toilet Transfer: Min guard;Ambulation;RW;Grab bars;Comfort height toilet (KI)   Toileting- Water quality scientist and Hygiene: Min guard;Sit to/from stand       Functional mobility during ADLs: Min guard;Rolling walker General ADL Comments: Pt/daughter instructed in compensatory techniques and practiced LB dressing. Educated pt and daughter on sequencing of ADLs with KI so  that pt does not stand without KI on until indicated. Pt's daughter demonstrated ability to don [place].                 Cognition  Arousal/Alertness: Awake/Alert Behavior During Therapy: WFL for tasks assessed/performed Overall Cognitive Status: Within Functional Limits for tasks assessed                                    Pertinent Vitals/ Pain       Pain Assessment: No/denies pain         Frequency Min 2X/week     Progress Toward Goals  OT Goals(current goals can now be found in the care plan section)  Progress towards OT goals: Progressing toward goals  Acute Rehab OT Goals Patient Stated Goal: To return and stay as independent as possible OT Goal Formulation: With patient Time For Goal Achievement: 12/30/13 Potential to Achieve Goals: Good ADL Goals Pt Will Perform Grooming: with supervision;standing Pt Will Perform Lower Body Dressing: with supervision;sit to/from stand Pt Will Transfer to Toilet: with supervision;ambulating;regular height toilet Pt Will Perform Toileting - Clothing Manipulation and hygiene: with supervision;sit to/from stand  Plan Discharge plan remains appropriate       End of Session Equipment Utilized During Treatment: Gait belt;Rolling walker;Right knee immobilizer   Activity Tolerance Patient tolerated treatment well   Patient Left in chair;with call bell/phone within reach;with family/visitor present   Nurse Communication Other (comment) (pt urinated and ready for d/c from OT standpoint)  Time: 5790-3833 OT Time Calculation (min): 42 min  Charges: OT General Charges $OT Visit: 1 Procedure OT Treatments $Self Care/Home Management : 38-52 mins  Juluis Rainier 12/17/2013, 11:07 AM  Cyndie Chime, OTR/L Occupational Therapist [phone] (pager)

## 2013-12-18 DIAGNOSIS — Z471 Aftercare following joint replacement surgery: Secondary | ICD-10-CM | POA: Diagnosis not present

## 2013-12-18 DIAGNOSIS — N189 Chronic kidney disease, unspecified: Secondary | ICD-10-CM | POA: Diagnosis not present

## 2013-12-18 DIAGNOSIS — F329 Major depressive disorder, single episode, unspecified: Secondary | ICD-10-CM | POA: Diagnosis not present

## 2013-12-18 DIAGNOSIS — Z96651 Presence of right artificial knee joint: Secondary | ICD-10-CM | POA: Diagnosis not present

## 2013-12-18 DIAGNOSIS — I129 Hypertensive chronic kidney disease with stage 1 through stage 4 chronic kidney disease, or unspecified chronic kidney disease: Secondary | ICD-10-CM | POA: Diagnosis not present

## 2013-12-18 DIAGNOSIS — I2510 Atherosclerotic heart disease of native coronary artery without angina pectoris: Secondary | ICD-10-CM | POA: Diagnosis not present

## 2013-12-20 DIAGNOSIS — I2510 Atherosclerotic heart disease of native coronary artery without angina pectoris: Secondary | ICD-10-CM | POA: Diagnosis not present

## 2013-12-20 DIAGNOSIS — F329 Major depressive disorder, single episode, unspecified: Secondary | ICD-10-CM | POA: Diagnosis not present

## 2013-12-20 DIAGNOSIS — Z96651 Presence of right artificial knee joint: Secondary | ICD-10-CM | POA: Diagnosis not present

## 2013-12-20 DIAGNOSIS — I129 Hypertensive chronic kidney disease with stage 1 through stage 4 chronic kidney disease, or unspecified chronic kidney disease: Secondary | ICD-10-CM | POA: Diagnosis not present

## 2013-12-20 DIAGNOSIS — Z471 Aftercare following joint replacement surgery: Secondary | ICD-10-CM | POA: Diagnosis not present

## 2013-12-20 DIAGNOSIS — N189 Chronic kidney disease, unspecified: Secondary | ICD-10-CM | POA: Diagnosis not present

## 2013-12-22 DIAGNOSIS — Z96651 Presence of right artificial knee joint: Secondary | ICD-10-CM | POA: Diagnosis not present

## 2013-12-22 DIAGNOSIS — N189 Chronic kidney disease, unspecified: Secondary | ICD-10-CM | POA: Diagnosis not present

## 2013-12-22 DIAGNOSIS — I2510 Atherosclerotic heart disease of native coronary artery without angina pectoris: Secondary | ICD-10-CM | POA: Diagnosis not present

## 2013-12-22 DIAGNOSIS — Z471 Aftercare following joint replacement surgery: Secondary | ICD-10-CM | POA: Diagnosis not present

## 2013-12-22 DIAGNOSIS — F329 Major depressive disorder, single episode, unspecified: Secondary | ICD-10-CM | POA: Diagnosis not present

## 2013-12-22 DIAGNOSIS — I129 Hypertensive chronic kidney disease with stage 1 through stage 4 chronic kidney disease, or unspecified chronic kidney disease: Secondary | ICD-10-CM | POA: Diagnosis not present

## 2013-12-24 DIAGNOSIS — Z96651 Presence of right artificial knee joint: Secondary | ICD-10-CM | POA: Diagnosis not present

## 2013-12-24 DIAGNOSIS — I129 Hypertensive chronic kidney disease with stage 1 through stage 4 chronic kidney disease, or unspecified chronic kidney disease: Secondary | ICD-10-CM | POA: Diagnosis not present

## 2013-12-24 DIAGNOSIS — I2510 Atherosclerotic heart disease of native coronary artery without angina pectoris: Secondary | ICD-10-CM | POA: Diagnosis not present

## 2013-12-24 DIAGNOSIS — Z471 Aftercare following joint replacement surgery: Secondary | ICD-10-CM | POA: Diagnosis not present

## 2013-12-24 DIAGNOSIS — N189 Chronic kidney disease, unspecified: Secondary | ICD-10-CM | POA: Diagnosis not present

## 2013-12-24 DIAGNOSIS — F329 Major depressive disorder, single episode, unspecified: Secondary | ICD-10-CM | POA: Diagnosis not present

## 2013-12-28 DIAGNOSIS — Z471 Aftercare following joint replacement surgery: Secondary | ICD-10-CM | POA: Diagnosis not present

## 2013-12-28 DIAGNOSIS — I129 Hypertensive chronic kidney disease with stage 1 through stage 4 chronic kidney disease, or unspecified chronic kidney disease: Secondary | ICD-10-CM | POA: Diagnosis not present

## 2013-12-28 DIAGNOSIS — I2510 Atherosclerotic heart disease of native coronary artery without angina pectoris: Secondary | ICD-10-CM | POA: Diagnosis not present

## 2013-12-28 DIAGNOSIS — F329 Major depressive disorder, single episode, unspecified: Secondary | ICD-10-CM | POA: Diagnosis not present

## 2013-12-28 DIAGNOSIS — Z96651 Presence of right artificial knee joint: Secondary | ICD-10-CM | POA: Diagnosis not present

## 2013-12-28 DIAGNOSIS — N189 Chronic kidney disease, unspecified: Secondary | ICD-10-CM | POA: Diagnosis not present

## 2013-12-29 DIAGNOSIS — N189 Chronic kidney disease, unspecified: Secondary | ICD-10-CM | POA: Diagnosis not present

## 2013-12-29 DIAGNOSIS — F329 Major depressive disorder, single episode, unspecified: Secondary | ICD-10-CM | POA: Diagnosis not present

## 2013-12-29 DIAGNOSIS — I2510 Atherosclerotic heart disease of native coronary artery without angina pectoris: Secondary | ICD-10-CM | POA: Diagnosis not present

## 2013-12-29 DIAGNOSIS — I129 Hypertensive chronic kidney disease with stage 1 through stage 4 chronic kidney disease, or unspecified chronic kidney disease: Secondary | ICD-10-CM | POA: Diagnosis not present

## 2013-12-29 DIAGNOSIS — Z96651 Presence of right artificial knee joint: Secondary | ICD-10-CM | POA: Diagnosis not present

## 2013-12-29 DIAGNOSIS — Z471 Aftercare following joint replacement surgery: Secondary | ICD-10-CM | POA: Diagnosis not present

## 2013-12-31 DIAGNOSIS — N189 Chronic kidney disease, unspecified: Secondary | ICD-10-CM | POA: Diagnosis not present

## 2013-12-31 DIAGNOSIS — F329 Major depressive disorder, single episode, unspecified: Secondary | ICD-10-CM | POA: Diagnosis not present

## 2013-12-31 DIAGNOSIS — I129 Hypertensive chronic kidney disease with stage 1 through stage 4 chronic kidney disease, or unspecified chronic kidney disease: Secondary | ICD-10-CM | POA: Diagnosis not present

## 2013-12-31 DIAGNOSIS — Z471 Aftercare following joint replacement surgery: Secondary | ICD-10-CM | POA: Diagnosis not present

## 2013-12-31 DIAGNOSIS — Z96651 Presence of right artificial knee joint: Secondary | ICD-10-CM | POA: Diagnosis not present

## 2013-12-31 DIAGNOSIS — I2510 Atherosclerotic heart disease of native coronary artery without angina pectoris: Secondary | ICD-10-CM | POA: Diagnosis not present

## 2014-01-04 DIAGNOSIS — M25561 Pain in right knee: Secondary | ICD-10-CM | POA: Diagnosis not present

## 2014-01-04 DIAGNOSIS — M62551 Muscle wasting and atrophy, not elsewhere classified, right thigh: Secondary | ICD-10-CM | POA: Diagnosis not present

## 2014-01-04 DIAGNOSIS — Z96651 Presence of right artificial knee joint: Secondary | ICD-10-CM | POA: Diagnosis not present

## 2014-01-04 DIAGNOSIS — M25461 Effusion, right knee: Secondary | ICD-10-CM | POA: Diagnosis not present

## 2014-01-06 ENCOUNTER — Ambulatory Visit: Payer: Medicare Other | Admitting: Cardiology

## 2014-01-11 DIAGNOSIS — Z96651 Presence of right artificial knee joint: Secondary | ICD-10-CM | POA: Diagnosis not present

## 2014-01-11 DIAGNOSIS — M62551 Muscle wasting and atrophy, not elsewhere classified, right thigh: Secondary | ICD-10-CM | POA: Diagnosis not present

## 2014-01-11 DIAGNOSIS — M25561 Pain in right knee: Secondary | ICD-10-CM | POA: Diagnosis not present

## 2014-01-11 DIAGNOSIS — M25461 Effusion, right knee: Secondary | ICD-10-CM | POA: Diagnosis not present

## 2014-01-13 DIAGNOSIS — M25461 Effusion, right knee: Secondary | ICD-10-CM | POA: Diagnosis not present

## 2014-01-13 DIAGNOSIS — M62551 Muscle wasting and atrophy, not elsewhere classified, right thigh: Secondary | ICD-10-CM | POA: Diagnosis not present

## 2014-01-13 DIAGNOSIS — M25561 Pain in right knee: Secondary | ICD-10-CM | POA: Diagnosis not present

## 2014-01-13 DIAGNOSIS — Z96651 Presence of right artificial knee joint: Secondary | ICD-10-CM | POA: Diagnosis not present

## 2014-01-17 DIAGNOSIS — M62551 Muscle wasting and atrophy, not elsewhere classified, right thigh: Secondary | ICD-10-CM | POA: Diagnosis not present

## 2014-01-17 DIAGNOSIS — M25561 Pain in right knee: Secondary | ICD-10-CM | POA: Diagnosis not present

## 2014-01-17 DIAGNOSIS — Z96651 Presence of right artificial knee joint: Secondary | ICD-10-CM | POA: Diagnosis not present

## 2014-01-17 DIAGNOSIS — M25461 Effusion, right knee: Secondary | ICD-10-CM | POA: Diagnosis not present

## 2014-01-18 DIAGNOSIS — Z471 Aftercare following joint replacement surgery: Secondary | ICD-10-CM | POA: Diagnosis not present

## 2014-01-18 DIAGNOSIS — Z96651 Presence of right artificial knee joint: Secondary | ICD-10-CM | POA: Diagnosis not present

## 2014-01-21 ENCOUNTER — Other Ambulatory Visit: Payer: Self-pay | Admitting: Cardiology

## 2014-01-21 MED ORDER — HYDROCHLOROTHIAZIDE 12.5 MG PO CAPS: 12.5000 mg | ORAL_CAPSULE | Freq: Every morning | ORAL | Status: DC

## 2014-01-25 NOTE — Progress Notes (Signed)
HPI: FU CAD. She is known to have left bundle branch block. Patient had left shoulder surgery in February 2013. She then developed a GI bleed and was readmitted in March. EGD revealed an ulcer and esophagitis. Cardiac markers were checked and her troponin was elevated. An echocardiogram showed an ejection fraction of 55%. There was mild hypokinesis of the inferior and inferoseptal myocardium. There was trace aortic insufficiency. There was mild to moderate mitral regurgitation. The left atrium was mildly dilated there was mild tricuspid regurgitation. After she recovered from her GI bleed a nuclear was performed in June of 2013 and this revealed an ejection fraction of 70% and normal perfusion. Since I last saw her, She had knee surgery. She has some weakness but denies dyspnea, chest pain, palpitations or syncope.  Current Outpatient Prescriptions  Medication Sig Dispense Refill  . ALPRAZolam (XANAX) 0.5 MG tablet Take 0.5 mg by mouth 2 (two) times daily as needed (tremors).     . diphenhydrAMINE (SOMINEX) 25 MG tablet Take 25 mg by mouth at bedtime as needed for sleep.    . hydrochlorothiazide (MICROZIDE) 12.5 MG capsule Take 1 capsule (12.5 mg total) by mouth every morning. 30 capsule 0  . ibandronate (BONIVA) 150 MG tablet   12  . metoprolol tartrate (LOPRESSOR) 25 MG tablet Take 25 mg by mouth 2 (two) times daily.    Marland Kitchen omeprazole (PRILOSEC) 20 MG capsule Take 20 mg by mouth every morning.     . rivaroxaban (XARELTO) 10 MG TABS tablet Take 1 tablet (10 mg total) by mouth daily with breakfast. Take Xarelto for two and a half more weeks, then discontinue Xarelto. Once the patient has completed the Xarelto, they may resume the 81 mg Aspirin. 19 tablet 0  . rosuvastatin (CRESTOR) 20 MG tablet Take 20 mg by mouth at bedtime.    . valsartan (DIOVAN) 320 MG tablet Take 320 mg by mouth every morning.     . [DISCONTINUED] zolpidem (AMBIEN) 5 MG tablet Take 1 tablet (5 mg total) by mouth at bedtime  as needed and may repeat dose one time if needed for sleep. 30 tablet 1   No current facility-administered medications for this visit.     Past Medical History  Diagnosis Date  . Anxiety   . GERD (gastroesophageal reflux disease)   . Depressive disorder   . Osteoporosis   . HTN (hypertension)   . HLD (hyperlipidemia)   . LBBB (left bundle branch block)   . Colon polyps     last colonoscopy when pt was around 9. No polyps reported but she did previously have small polyps  . PONV (postoperative nausea and vomiting)   . History of pulmonary embolism 2011  W/ THROMBOPHEBITIS-- RESOLVED  . History of GI bleed GASTRIC ULCER MARCH 2013--  BLOOD TRANSFUSION  . Benign head tremor   . Coronary artery disease CARDIOLOGIST- DR Gerldine Suleiman-- LOV IN EPIC MAY 2013    MYOVIEW 08-20-2011--  NO ISCHEMIA-  IN EPIC  . S/P dilatation of esophageal stricture   . Bladder cancer RECURRENT- HX TCC OF BLADDER  . OA (osteoarthritis) HANDS AND JOINTS  . Numbness of fingers   . Carpal tunnel syndrome on both sides   . SUI (stress urinary incontinence, female)   . Hearing loss bilateral hearing aids  . History of non-ST elevation myocardial infarction (NSTEMI) MARCH 2013--   +TROPONINS/  GI BLEED     MEDICAL MANAGEMENT pt states was informed she did not have a  MI  . Bronchitis     pt states has yearly  . Pneumonia     hx of   . Pleurisy     hx of   . H/O hiatal hernia     pt denies  . Anemia     2013 transfusion times 3    Past Surgical History  Procedure Laterality Date  . Carpal tunnel release      BILATERAL  . Total knee arthroplasty  11-30-2007    LEFT KNEE  . Total hip arthroplasty  07-01-2003    RIGHT HIP  . Knee arthroscopy  05    rt  . Total shoulder arthroplasty  05/09/2011    Procedure: TOTAL SHOULDER ARTHROPLASTY;  Surgeon: Marin Shutter, MD;  Location: [place];  Service: Orthopedics;  Laterality: Left;  Left Shoulder Total Arthroplasty   . Esophagogastroduodenoscopy  05/23/2011     Procedure: ESOPHAGOGASTRODUODENOSCOPY (EGD);  Surgeon: Irene Shipper, MD;  Location: [facility];  Service: Endoscopy;  Laterality: N/A;  . Cysto/ bilateral retrograde pyelogram/ fulgeration bladder tumor/ left ureteroscopy  02-28-2009  DR PETERSON  . Transurethral resection of bladder tumor  06-04-2005  &  11-09-2002    LOW GRADE NON-INVASIVE TRANSITIONAL CELL CARCINOMA  . Right total knee replacement  01-02-2004  . Laparoscopic cholecystectomy  1998  . Appendectomy  1970    AND TUBAL LIGATION  . Cataract extraction w/ intraocular lens  implant, bilateral    . Cardiovascular stress test  08-20-2011  DR Mirenda Baltazar    NORMAL NUCLEAR STUDY (NO EXERCISE)/ BASELINE LBBB/ NO ISCHEMIA/ EF 70%/ LV WALL MOTION  AND FUNCTION NORMAL  . Transthoracic echocardiogram  05-24-2011    MILD LVH/ GRADE I DIASTOLIC DYSFUNTION/ EF 71%/ MILD HYPOKINESIS/ MILD TO MODERATE MITRAL REGURG./ MILD TRICUSPID REGURG./ MODERATE INCREASE SYSTOLIC PRESSURE, PULMONARY ARTERIES  . Cystoscopy w/ retrogrades  12/16/2011    Procedure: CYSTOSCOPY WITH RETROGRADE PYELOGRAM;  Surgeon: Franchot Gallo, MD;  Location: [facility];  Service: Urology;  Laterality: Bilateral;  45 mins requested for this case  C-ARM    . Total knee revision Right 12/15/2013    Procedure: RIGHT KNEE POLYETHYLENE  EXCHANGE;  Surgeon: Gearlean Alf, MD;  Location: [facility];  Service: Orthopedics;  Laterality: Right;    History   Social History  . Marital Status: Widowed    Spouse Name: N/A    Number of Children: 1  . Years of Education: N/A   Occupational History  . retired    Social History Main Topics  . Smoking status: Never Smoker   . Smokeless tobacco: Never Used  . Alcohol Use: No  . Drug Use: No  . Sexual Activity: Not on file   Other Topics Concern  . Not on file   Social History Narrative    ROS: no fevers or chills, productive cough, hemoptysis, dysphasia, odynophagia, melena, hematochezia, dysuria, hematuria,  rash, seizure activity, orthopnea, PND, pedal edema, claudication. Remaining systems are negative.  Physical Exam: Well-developed well-nourished in no acute distress.  Skin is warm and dry.  HEENT is normal.  Neck is supple.  Chest is clear to auscultation with normal expansion.  Cardiovascular exam is regular rate and rhythm. 1/6 systolic ejection murmur Abdominal exam nontender or distended. No masses palpated. Extremities show no edema. Status post right knee surgery. neuro grossly intact  ECG Sinus rhythm with PVCs. Left bundle branch block.

## 2014-01-27 ENCOUNTER — Encounter: Payer: Self-pay | Admitting: Cardiology

## 2014-01-27 ENCOUNTER — Ambulatory Visit (INDEPENDENT_AMBULATORY_CARE_PROVIDER_SITE_OTHER): Payer: Medicare Other | Admitting: Cardiology

## 2014-01-27 VITALS — BP 100/68 | HR 79 | Ht <= 58 in | Wt 128.5 lb

## 2014-01-27 DIAGNOSIS — I10 Essential (primary) hypertension: Secondary | ICD-10-CM | POA: Diagnosis not present

## 2014-01-27 DIAGNOSIS — I214 Non-ST elevation (NSTEMI) myocardial infarction: Secondary | ICD-10-CM | POA: Diagnosis not present

## 2014-01-27 DIAGNOSIS — D538 Other specified nutritional anemias: Secondary | ICD-10-CM

## 2014-01-27 DIAGNOSIS — E785 Hyperlipidemia, unspecified: Secondary | ICD-10-CM

## 2014-01-27 NOTE — Assessment & Plan Note (Signed)
Patient is describing some fatigue since recent knee surgery. She is concerned she may be anemic.We will check her hemoglobin. Check TSH.

## 2014-01-27 NOTE — Assessment & Plan Note (Signed)
Blood pressure controlled. Continue present medications. Check potassium and renal function. 

## 2014-01-27 NOTE — Assessment & Plan Note (Signed)
Continue statin. 

## 2014-01-27 NOTE — Patient Instructions (Signed)
Your physician wants you to follow-up in: 6 MONTHS WITH DR CRENSHAW You will receive a reminder letter in the mail two months in advance. If you don't receive a letter, please call our office to schedule the follow-up appointment.   Your physician recommends that you HAVE LAB WORK TODAY 

## 2014-01-27 NOTE — Assessment & Plan Note (Signed)
Patient's previous MI occurred in the setting of GI bleed and anemia. Since then she has had no chest pain. Previous Myoview was normal. Plan medical therapy. Continue aspirin, beta blocker and statin. 

## 2014-01-28 ENCOUNTER — Other Ambulatory Visit: Payer: Self-pay | Admitting: *Deleted

## 2014-01-28 LAB — BASIC METABOLIC PANEL WITH GFR
BUN: 33 mg/dL — ABNORMAL HIGH (ref 6–23)
CHLORIDE: 103 meq/L (ref 96–112)
CO2: 26 meq/L (ref 19–32)
Calcium: 10.1 mg/dL (ref 8.4–10.5)
Creat: 1.4 mg/dL — ABNORMAL HIGH (ref 0.50–1.10)
GFR, EST NON AFRICAN AMERICAN: 34 mL/min — AB
GFR, Est African American: 40 mL/min — ABNORMAL LOW
Glucose, Bld: 101 mg/dL — ABNORMAL HIGH (ref 70–99)
POTASSIUM: 4.6 meq/L (ref 3.5–5.3)
Sodium: 137 mEq/L (ref 135–145)

## 2014-01-28 LAB — CBC
HCT: 37.4 % (ref 36.0–46.0)
Hemoglobin: 12.4 g/dL (ref 12.0–15.0)
MCH: 30.8 pg (ref 26.0–34.0)
MCHC: 33.2 g/dL (ref 30.0–36.0)
MCV: 93 fL (ref 78.0–100.0)
PLATELETS: 151 10*3/uL (ref 150–400)
RBC: 4.02 MIL/uL (ref 3.87–5.11)
RDW: 13.7 % (ref 11.5–15.5)
WBC: 5.1 10*3/uL (ref 4.0–10.5)

## 2014-01-28 LAB — TSH: TSH: 1.454 u[IU]/mL (ref 0.350–4.500)

## 2014-02-07 DIAGNOSIS — M7072 Other bursitis of hip, left hip: Secondary | ICD-10-CM | POA: Diagnosis not present

## 2014-02-07 DIAGNOSIS — Z23 Encounter for immunization: Secondary | ICD-10-CM | POA: Diagnosis not present

## 2014-02-07 DIAGNOSIS — Z6827 Body mass index (BMI) 27.0-27.9, adult: Secondary | ICD-10-CM | POA: Diagnosis not present

## 2014-02-07 DIAGNOSIS — M5432 Sciatica, left side: Secondary | ICD-10-CM | POA: Diagnosis not present

## 2014-02-21 DIAGNOSIS — M47816 Spondylosis without myelopathy or radiculopathy, lumbar region: Secondary | ICD-10-CM | POA: Diagnosis not present

## 2014-02-25 DIAGNOSIS — M4726 Other spondylosis with radiculopathy, lumbar region: Secondary | ICD-10-CM | POA: Diagnosis not present

## 2014-02-25 DIAGNOSIS — M545 Low back pain: Secondary | ICD-10-CM | POA: Diagnosis not present

## 2014-02-25 DIAGNOSIS — M5416 Radiculopathy, lumbar region: Secondary | ICD-10-CM | POA: Diagnosis not present

## 2014-03-08 DIAGNOSIS — I10 Essential (primary) hypertension: Secondary | ICD-10-CM | POA: Diagnosis not present

## 2014-03-08 DIAGNOSIS — I2510 Atherosclerotic heart disease of native coronary artery without angina pectoris: Secondary | ICD-10-CM | POA: Diagnosis not present

## 2014-03-08 DIAGNOSIS — E785 Hyperlipidemia, unspecified: Secondary | ICD-10-CM | POA: Diagnosis not present

## 2014-03-08 DIAGNOSIS — D62 Acute posthemorrhagic anemia: Secondary | ICD-10-CM | POA: Diagnosis not present

## 2014-03-08 DIAGNOSIS — Z6826 Body mass index (BMI) 26.0-26.9, adult: Secondary | ICD-10-CM | POA: Diagnosis not present

## 2014-03-14 ENCOUNTER — Encounter: Payer: Self-pay | Admitting: Cardiology

## 2014-03-21 DIAGNOSIS — M5416 Radiculopathy, lumbar region: Secondary | ICD-10-CM | POA: Diagnosis not present

## 2014-03-21 DIAGNOSIS — M47816 Spondylosis without myelopathy or radiculopathy, lumbar region: Secondary | ICD-10-CM | POA: Diagnosis not present

## 2014-06-01 DIAGNOSIS — Z760 Encounter for issue of repeat prescription: Secondary | ICD-10-CM | POA: Diagnosis not present

## 2014-06-01 DIAGNOSIS — J029 Acute pharyngitis, unspecified: Secondary | ICD-10-CM | POA: Diagnosis not present

## 2014-06-01 DIAGNOSIS — J208 Acute bronchitis due to other specified organisms: Secondary | ICD-10-CM | POA: Diagnosis not present

## 2014-06-01 DIAGNOSIS — Z6826 Body mass index (BMI) 26.0-26.9, adult: Secondary | ICD-10-CM | POA: Diagnosis not present

## 2014-06-01 DIAGNOSIS — R6889 Other general symptoms and signs: Secondary | ICD-10-CM | POA: Diagnosis not present

## 2014-07-04 ENCOUNTER — Telehealth: Payer: Self-pay | Admitting: Cardiology

## 2014-07-04 NOTE — Telephone Encounter (Signed)
Agree with plan as outlined Brian Crenshaw  

## 2014-07-04 NOTE — Telephone Encounter (Signed)
Christina [name] is calling because she is having little pain on her (l) side under her breast area. Please call back    Thanks

## 2014-07-04 NOTE — Telephone Encounter (Signed)
Spoke with pt, Aware of dr crenshaw's recommendations.  °

## 2014-07-04 NOTE — Telephone Encounter (Signed)
Pt wanted to report pain under left breast occurrent for ~3 weeks. This resolves w/ belching. She reports no other discomfort or symptoms w/ this. Noted hx of reflux - she is taking OTC PPI daily.  Advised would let Dr. Stanford Breed know she was concerned. Advised to keep appt for 09/16/14  She requests callback, OK to leave message on home machine.

## 2014-08-03 DIAGNOSIS — N393 Stress incontinence (female) (male): Secondary | ICD-10-CM | POA: Diagnosis not present

## 2014-08-03 DIAGNOSIS — Z8551 Personal history of malignant neoplasm of bladder: Secondary | ICD-10-CM | POA: Diagnosis not present

## 2014-09-12 ENCOUNTER — Other Ambulatory Visit: Payer: Self-pay

## 2014-09-13 DIAGNOSIS — Z6826 Body mass index (BMI) 26.0-26.9, adult: Secondary | ICD-10-CM | POA: Diagnosis not present

## 2014-09-13 DIAGNOSIS — I2510 Atherosclerotic heart disease of native coronary artery without angina pectoris: Secondary | ICD-10-CM | POA: Diagnosis not present

## 2014-09-13 DIAGNOSIS — D62 Acute posthemorrhagic anemia: Secondary | ICD-10-CM | POA: Diagnosis not present

## 2014-09-13 DIAGNOSIS — I10 Essential (primary) hypertension: Secondary | ICD-10-CM | POA: Diagnosis not present

## 2014-09-13 DIAGNOSIS — Z1389 Encounter for screening for other disorder: Secondary | ICD-10-CM | POA: Diagnosis not present

## 2014-09-13 DIAGNOSIS — E785 Hyperlipidemia, unspecified: Secondary | ICD-10-CM | POA: Diagnosis not present

## 2014-09-14 ENCOUNTER — Encounter: Payer: Self-pay | Admitting: Cardiology

## 2014-09-14 NOTE — Progress Notes (Signed)
HPI:  FU CAD. She is known to have left bundle branch block. Patient had left shoulder surgery in February 2013. She then developed a GI bleed and was readmitted in March. EGD revealed an ulcer and esophagitis. Cardiac markers were checked and her troponin was elevated. An echocardiogram showed an ejection fraction of 55%. There was mild hypokinesis of the inferior and inferoseptal myocardium. There was trace aortic insufficiency. There was mild to moderate mitral regurgitation. The left atrium was mildly dilated there was mild tricuspid regurgitation. After she recovered from her GI bleed a nuclear was performed in June of 2013 and this revealed an ejection fraction of 70% and normal perfusion. Since I last saw her, she denies dyspnea, exertional chest pain or syncope. Occasional mild pedal edema.   Current Outpatient Prescriptions  Medication Sig Dispense Refill  . ALPRAZolam (XANAX) 0.5 MG tablet Take 0.5 mg by mouth 2 (two) times daily as needed (tremors).     Marland Kitchen aspirin 81 MG tablet Take 81 mg by mouth daily.    . Cholecalciferol (VITAMIN D3) 2000 UNITS TABS Take by mouth daily.    . diphenhydrAMINE (SOMINEX) 25 MG tablet Take 25 mg by mouth at bedtime as needed for sleep.    Marland Kitchen ibandronate (BONIVA) 150 MG tablet   12  . Melatonin 3 MG TABS Take by mouth at bedtime.    . metoprolol tartrate (LOPRESSOR) 25 MG tablet Take 25 mg by mouth 2 (two) times daily. Take one tablet in the morning and one at night.    Marland Kitchen omeprazole (PRILOSEC) 20 MG capsule Take 20 mg by mouth every morning.     . rosuvastatin (CRESTOR) 20 MG tablet Take 20 mg by mouth at bedtime.    . valsartan (DIOVAN) 320 MG tablet Take 320 mg by mouth every morning.     . vitamin B-12 (CYANOCOBALAMIN) 500 MCG tablet Take 500 mcg by mouth daily.    . [DISCONTINUED] zolpidem (AMBIEN) 5 MG tablet Take 1 tablet (5 mg total) by mouth at bedtime as needed and may repeat dose one time if needed for sleep. 30 tablet 1   No current  facility-administered medications for this visit.     Past Medical History  Diagnosis Date  . Anxiety   . GERD (gastroesophageal reflux disease)   . Depressive disorder   . Osteoporosis   . HTN (hypertension)   . HLD (hyperlipidemia)   . LBBB (left bundle branch block)   . Colon polyps     last colonoscopy when pt was around 59. No polyps reported but she did previously have small polyps  . PONV (postoperative nausea and vomiting)   . History of pulmonary embolism 2011  W/ THROMBOPHEBITIS-- RESOLVED  . History of GI bleed GASTRIC ULCER MARCH 2013--  BLOOD TRANSFUSION  . Benign head tremor   . Coronary artery disease CARDIOLOGIST- DR CRENSHAW-- LOV IN EPIC MAY 2013    MYOVIEW 08-20-2011--  NO ISCHEMIA-  IN EPIC  . S/P dilatation of esophageal stricture   . Bladder cancer RECURRENT- HX TCC OF BLADDER  . OA (osteoarthritis) HANDS AND JOINTS  . Numbness of fingers   . Carpal tunnel syndrome on both sides   . SUI (stress urinary incontinence, female)   . Hearing loss bilateral hearing aids  . History of non-ST elevation myocardial infarction (NSTEMI) MARCH 2013--   +TROPONINS/  GI BLEED     MEDICAL MANAGEMENT pt states was informed she did not have a MI  .  Bronchitis     pt states has yearly  . Pneumonia     hx of   . Pleurisy     hx of   . H/O hiatal hernia     pt denies  . Anemia     2013 transfusion times 3    Past Surgical History  Procedure Laterality Date  . Carpal tunnel release      BILATERAL  . Total knee arthroplasty  11-30-2007    LEFT KNEE  . Total hip arthroplasty  07-01-2003    RIGHT HIP  . Knee arthroscopy  05    rt  . Total shoulder arthroplasty  05/09/2011    Procedure: TOTAL SHOULDER ARTHROPLASTY;  Surgeon: Marin Shutter, MD;  Location: [place];  Service: Orthopedics;  Laterality: Left;  Left Shoulder Total Arthroplasty   . Esophagogastroduodenoscopy  05/23/2011    Procedure: ESOPHAGOGASTRODUODENOSCOPY (EGD);  Surgeon: Irene Shipper, MD;  Location: [facility]  ENDOSCOPY;  Service: Endoscopy;  Laterality: N/A;  . Cysto/ bilateral retrograde pyelogram/ fulgeration bladder tumor/ left ureteroscopy  02-28-2009  DR PETERSON  . Transurethral resection of bladder tumor  06-04-2005  &  11-09-2002    LOW GRADE NON-INVASIVE TRANSITIONAL CELL CARCINOMA  . Right total knee replacement  01-02-2004  . Laparoscopic cholecystectomy  1998  . Appendectomy  1970    AND TUBAL LIGATION  . Cataract extraction w/ intraocular lens  implant, bilateral    . Cardiovascular stress test  08-20-2011  DR CRENSHAW    NORMAL NUCLEAR STUDY (NO EXERCISE)/ BASELINE LBBB/ NO ISCHEMIA/ EF 70%/ LV WALL MOTION  AND FUNCTION NORMAL  . Transthoracic echocardiogram  05-24-2011    MILD LVH/ GRADE I DIASTOLIC DYSFUNTION/ EF 46%/ MILD HYPOKINESIS/ MILD TO MODERATE MITRAL REGURG./ MILD TRICUSPID REGURG./ MODERATE INCREASE SYSTOLIC PRESSURE, PULMONARY ARTERIES  . Cystoscopy w/ retrogrades  12/16/2011    Procedure: CYSTOSCOPY WITH RETROGRADE PYELOGRAM;  Surgeon: Franchot Gallo, MD;  Location: [facility];  Service: Urology;  Laterality: Bilateral;  45 mins requested for this case  C-ARM    . Total knee revision Right 12/15/2013    Procedure: RIGHT KNEE POLYETHYLENE  EXCHANGE;  Surgeon: Gearlean Alf, MD;  Location: [facility];  Service: Orthopedics;  Laterality: Right;    History   Social History  . Marital Status: Widowed    Spouse Name: N/A  . Number of Children: 1  . Years of Education: N/A   Occupational History  . retired    Social History Main Topics  . Smoking status: Never Smoker   . Smokeless tobacco: Never Used  . Alcohol Use: No  . Drug Use: No  . Sexual Activity: Not on file   Other Topics Concern  . Not on file   Social History Narrative    ROS: some pain in right lower extremity from recent trauma but no fevers or chills, productive cough, hemoptysis, dysphasia, odynophagia, melena, hematochezia, dysuria, hematuria, rash, seizure activity,  orthopnea, PND, claudication. Remaining systems are negative.  Physical Exam: Well-developed well-nourished in no acute distress.  Skin is warm and dry.  HEENT is normal.  Neck is supple.  Chest is clear to auscultation with normal expansion.  Cardiovascular exam is regular rate and rhythm. 1/6 systolic ejection murmur Abdominal exam nontender or distended. No masses palpated. Extremities show trace edema. Ecchymosis right lower extremity from recent trauma. neuro grossly intact  ECG sinus rhythm with PACs and PVCs. Left bundle branch block.

## 2014-09-16 ENCOUNTER — Encounter: Payer: Self-pay | Admitting: Cardiology

## 2014-09-16 ENCOUNTER — Ambulatory Visit (INDEPENDENT_AMBULATORY_CARE_PROVIDER_SITE_OTHER): Payer: Medicare Other | Admitting: Cardiology

## 2014-09-16 VITALS — BP 178/84 | HR 63 | Ht <= 58 in | Wt 129.0 lb

## 2014-09-16 DIAGNOSIS — I10 Essential (primary) hypertension: Secondary | ICD-10-CM

## 2014-09-16 DIAGNOSIS — I2583 Coronary atherosclerosis due to lipid rich plaque: Secondary | ICD-10-CM

## 2014-09-16 DIAGNOSIS — I2510 Atherosclerotic heart disease of native coronary artery without angina pectoris: Secondary | ICD-10-CM | POA: Diagnosis not present

## 2014-09-16 DIAGNOSIS — R609 Edema, unspecified: Secondary | ICD-10-CM | POA: Diagnosis not present

## 2014-09-16 DIAGNOSIS — E785 Hyperlipidemia, unspecified: Secondary | ICD-10-CM

## 2014-09-16 MED ORDER — FUROSEMIDE 20 MG PO TABS: 20.0000 mg | ORAL_TABLET | Freq: Every day | ORAL | Status: DC | PRN

## 2014-09-16 NOTE — Patient Instructions (Signed)
Your physician wants you to follow-up in: [place] will receive a reminder letter in the mail two months in advance. If you don't receive a letter, please call our office to schedule the follow-up appointment.   START FUROSEMIDE 20 MG ONCE DAILY AS NEEDED  Your physician recommends that you return for lab work in: [place]

## 2014-09-16 NOTE — Assessment & Plan Note (Signed)
Continue statin. 

## 2014-09-16 NOTE — Assessment & Plan Note (Signed)
I have added Lasix 20 mg every day as needed for lower extremity edema. Check potassium and renal function in 4 weeks.

## 2014-09-16 NOTE — Assessment & Plan Note (Addendum)
Patient's previous MI occurred in the setting of GI bleed and anemia. Since then she has had no chest pain. Previous Myoview was normal. Plan medical therapy. Continue aspirin, beta blocker and statin.

## 2014-09-16 NOTE — Assessment & Plan Note (Signed)
Blood pressure is elevated today but she states typically controlled. I will continue with present medications. She will follow her blood pressure at home and we will add additional medications as needed.

## 2014-09-26 ENCOUNTER — Telehealth: Payer: Self-pay | Admitting: Cardiology

## 2014-09-26 ENCOUNTER — Other Ambulatory Visit: Payer: Self-pay | Admitting: *Deleted

## 2014-09-26 MED ORDER — AMLODIPINE BESYLATE 5 MG PO TABS: 5.0000 mg | ORAL_TABLET | Freq: Every day | ORAL | Status: DC

## 2014-09-26 NOTE — Telephone Encounter (Signed)
Pt. Called in with c/o Elevated BP , Dr. Stanford Breed informed received orders to add amlodipine 5 mg daily

## 2014-09-26 NOTE — Telephone Encounter (Signed)
Called pt. No answer and not able to leave message

## 2014-09-26 NOTE — Telephone Encounter (Signed)
Pt c/o BP issue: STAT if pt c/o blurred vision, one-sided weakness or slurred speech  1. What are your last 5 BP readings? 190/80 (last night)  2. Are you having any other symptoms (ex. Dizziness, headache, blurred vision, passed out)? Pt states "i just don't feel good"  3. What is your BP issue? Pt is concerned her blood pressure has been running high and feels one of her medications needs to be changed.

## 2014-10-03 ENCOUNTER — Telehealth: Payer: Self-pay | Admitting: Cardiology

## 2014-10-03 NOTE — Telephone Encounter (Signed)
Continue present meds and follow Brian Crenshaw  

## 2014-10-03 NOTE — Telephone Encounter (Signed)
Christina [name] is calling to give a list of her bp readings for a week . Please

## 2014-10-03 NOTE — Telephone Encounter (Signed)
Spoke to  Patient She states she was suppose to call with blood pressure readings  she wanted Dr Stanford Breed to be aware she has Essential Tremors which is being managed by taking XANAX 1- 2 a day.She is aware her tremors are getting worse but xanax is helping. She wanted Dr Stanford Breed to be aware of the days he uses the medication  09/27/14  170/79  p 54 09/28/14  135/75  p 60 used xanax twice a day 09/29/14   131/73 p.64 used xanax twice a day 09/30/14   138/72  p 45 used xanax twice a day 10/01/14   168/90  p 84 10/02/14    153/66 p 51 10/02/14 at 11:30  Pm 171/90 p 61 10/03/14      167/93  p. 66 7/18 /16 hour later after taking xanax 145/75 p 62  Will defer to Dr Stanford Breed.

## 2014-10-03 NOTE — Telephone Encounter (Signed)
Christina [name] is calling to give a week of her bp readings . Please call .Marland Kitchen  Thanks

## 2014-10-04 NOTE — Telephone Encounter (Signed)
Spoke with pt, Aware of dr crenshaw's recommendations.  °

## 2014-10-11 ENCOUNTER — Other Ambulatory Visit: Payer: Self-pay | Admitting: Internal Medicine

## 2014-10-11 DIAGNOSIS — N6320 Unspecified lump in the left breast, unspecified quadrant: Secondary | ICD-10-CM

## 2014-10-12 ENCOUNTER — Ambulatory Visit
Admission: RE | Admit: 2014-10-12 | Discharge: 2014-10-12 | Disposition: A | Discharge status: Home / Self Care | Payer: Medicare Other | Source: Ambulatory Visit | Attending: Internal Medicine | Admitting: Internal Medicine

## 2014-10-12 DIAGNOSIS — N63 Unspecified lump in breast: Secondary | ICD-10-CM | POA: Diagnosis not present

## 2014-10-12 DIAGNOSIS — N6320 Unspecified lump in the left breast, unspecified quadrant: Secondary | ICD-10-CM

## 2014-10-12 DIAGNOSIS — Z01419 Encounter for gynecological examination (general) (routine) without abnormal findings: Secondary | ICD-10-CM | POA: Diagnosis not present

## 2014-12-15 ENCOUNTER — Encounter: Payer: Self-pay | Admitting: *Deleted

## 2014-12-27 ENCOUNTER — Telehealth: Payer: Self-pay | Admitting: Cardiology

## 2014-12-27 ENCOUNTER — Encounter: Payer: Self-pay | Admitting: Cardiology

## 2014-12-29 NOTE — Telephone Encounter (Signed)
Close encounter 

## 2015-03-06 ENCOUNTER — Ambulatory Visit: Payer: Medicare Other | Admitting: Cardiology

## 2015-03-09 ENCOUNTER — Encounter: Payer: Self-pay | Admitting: Cardiology

## 2015-03-15 DIAGNOSIS — Z1389 Encounter for screening for other disorder: Secondary | ICD-10-CM | POA: Diagnosis not present

## 2015-03-15 DIAGNOSIS — Z23 Encounter for immunization: Secondary | ICD-10-CM | POA: Diagnosis not present

## 2015-03-15 DIAGNOSIS — Z9181 History of falling: Secondary | ICD-10-CM | POA: Diagnosis not present

## 2015-03-15 DIAGNOSIS — D62 Acute posthemorrhagic anemia: Secondary | ICD-10-CM | POA: Diagnosis not present

## 2015-03-15 DIAGNOSIS — E559 Vitamin D deficiency, unspecified: Secondary | ICD-10-CM | POA: Diagnosis not present

## 2015-03-15 DIAGNOSIS — I10 Essential (primary) hypertension: Secondary | ICD-10-CM | POA: Diagnosis not present

## 2015-03-15 DIAGNOSIS — Z6827 Body mass index (BMI) 27.0-27.9, adult: Secondary | ICD-10-CM | POA: Diagnosis not present

## 2015-03-15 DIAGNOSIS — E785 Hyperlipidemia, unspecified: Secondary | ICD-10-CM | POA: Diagnosis not present

## 2015-03-15 DIAGNOSIS — I2510 Atherosclerotic heart disease of native coronary artery without angina pectoris: Secondary | ICD-10-CM | POA: Diagnosis not present

## 2015-03-15 DIAGNOSIS — I872 Venous insufficiency (chronic) (peripheral): Secondary | ICD-10-CM | POA: Diagnosis not present

## 2015-03-16 ENCOUNTER — Encounter: Payer: Self-pay | Admitting: Cardiology

## 2015-03-22 NOTE — Progress Notes (Signed)
HPI: FU CAD. She is known to have left bundle branch block. Patient had left shoulder surgery in February 2013. She then developed a GI bleed and was readmitted in March. EGD revealed an ulcer and esophagitis. Cardiac markers were checked and her troponin was elevated. An echocardiogram showed an ejection fraction of 55%. There was mild hypokinesis of the inferior and inferoseptal myocardium. There was trace aortic insufficiency. There was mild to moderate mitral regurgitation. The left atrium was mildly dilated there was mild tricuspid regurgitation. After she recovered from her GI bleed a nuclear was performed in June of 2013 and this revealed an ejection fraction of 70% and normal perfusion. Since I last saw her, She has had problems with lower extremity edema. Her primary care physician discontinued her amlodipine. She denies dyspnea, chest pain, palpitations or syncope.  Current Outpatient Prescriptions  Medication Sig Dispense Refill  . ALPRAZolam (XANAX) 0.5 MG tablet Take 0.5 mg by mouth 2 (two) times daily as needed (tremors).     Marland Kitchen amLODipine (NORVASC) 5 MG tablet Take 1 tablet (5 mg total) by mouth daily. 180 tablet 3  . aspirin 81 MG tablet Take 81 mg by mouth daily.    . Cholecalciferol (VITAMIN D3) 2000 UNITS TABS Take by mouth daily.    . furosemide (LASIX) 20 MG tablet Take 1 tablet (20 mg total) by mouth daily as needed. 30 tablet 6  . ibandronate (BONIVA) 150 MG tablet   12  . Melatonin 3 MG TABS Take by mouth at bedtime.    . metoprolol tartrate (LOPRESSOR) 25 MG tablet Take 25 mg by mouth 2 (two) times daily. Take one tablet in the morning and one at night.    Marland Kitchen omeprazole (PRILOSEC) 20 MG capsule Take 20 mg by mouth every morning.     . rosuvastatin (CRESTOR) 20 MG tablet Take 20 mg by mouth at bedtime.    . valsartan (DIOVAN) 320 MG tablet Take 320 mg by mouth every morning.     . vitamin B-12 (CYANOCOBALAMIN) 500 MCG tablet Take 500 mcg by mouth daily.    .  [DISCONTINUED] zolpidem (AMBIEN) 5 MG tablet Take 1 tablet (5 mg total) by mouth at bedtime as needed and may repeat dose one time if needed for sleep. 30 tablet 1   No current facility-administered medications for this visit.     Past Medical History  Diagnosis Date  . Anxiety   . GERD (gastroesophageal reflux disease)   . Depressive disorder   . Osteoporosis   . HTN (hypertension)   . HLD (hyperlipidemia)   . LBBB (left bundle branch block)   . Colon polyps     last colonoscopy when pt was around 8. No polyps reported but she did previously have small polyps  . PONV (postoperative nausea and vomiting)   . History of pulmonary embolism 2011  W/ THROMBOPHEBITIS-- RESOLVED  . History of GI bleed GASTRIC ULCER MARCH 2013--  BLOOD TRANSFUSION  . Benign head tremor   . Coronary artery disease CARDIOLOGIST- DR CRENSHAW-- LOV IN EPIC MAY 2013    MYOVIEW 08-20-2011--  NO ISCHEMIA-  IN EPIC  . S/P dilatation of esophageal stricture   . Bladder cancer (HCC) RECURRENT- HX TCC OF BLADDER  . OA (osteoarthritis) HANDS AND JOINTS  . Numbness of fingers   . Carpal tunnel syndrome on both sides   . SUI (stress urinary incontinence, female)   . Hearing loss bilateral hearing aids  . History of non-ST  elevation myocardial infarction (NSTEMI) MARCH 2013--   +TROPONINS/  GI BLEED     MEDICAL MANAGEMENT pt states was informed she did not have a MI  . Bronchitis     pt states has yearly  . Pneumonia     hx of   . Pleurisy     hx of   . H/O hiatal hernia     pt denies  . Anemia     2013 transfusion times 3    Past Surgical History  Procedure Laterality Date  . Carpal tunnel release      BILATERAL  . Total knee arthroplasty  11-30-2007    LEFT KNEE  . Total hip arthroplasty  07-01-2003    RIGHT HIP  . Knee arthroscopy  05    rt  . Total shoulder arthroplasty  05/09/2011    Procedure: TOTAL SHOULDER ARTHROPLASTY;  Surgeon: Marin Shutter, MD;  Location: [place];  Service: Orthopedics;   Laterality: Left;  Left Shoulder Total Arthroplasty   . Esophagogastroduodenoscopy  05/23/2011    Procedure: ESOPHAGOGASTRODUODENOSCOPY (EGD);  Surgeon: Irene Shipper, MD;  Location: [facility] ENDOSCOPY;  Service: Endoscopy;  Laterality: N/A;  . Cysto/ bilateral retrograde pyelogram/ fulgeration bladder tumor/ left ureteroscopy  02-28-2009  DR PETERSON  . Transurethral resection of bladder tumor  06-04-2005  &  11-09-2002    LOW GRADE NON-INVASIVE TRANSITIONAL CELL CARCINOMA  . Right total knee replacement  01-02-2004  . Laparoscopic cholecystectomy  1998  . Appendectomy  1970    AND TUBAL LIGATION  . Cataract extraction w/ intraocular lens  implant, bilateral    . Cardiovascular stress test  08-20-2011  DR CRENSHAW    NORMAL NUCLEAR STUDY (NO EXERCISE)/ BASELINE LBBB/ NO ISCHEMIA/ EF 70%/ LV WALL MOTION  AND FUNCTION NORMAL  . Transthoracic echocardiogram  05-24-2011    MILD LVH/ GRADE I DIASTOLIC DYSFUNTION/ EF [id number] MILD HYPOKINESIS/ MILD TO MODERATE MITRAL REGURG./ MILD TRICUSPID REGURG./ MODERATE INCREASE SYSTOLIC PRESSURE, PULMONARY ARTERIES  . Cystoscopy w/ retrogrades  12/16/2011    Procedure: CYSTOSCOPY WITH RETROGRADE PYELOGRAM;  Surgeon: Franchot Gallo, MD;  Location: [facility];  Service: Urology;  Laterality: Bilateral;  45 mins requested for this case  C-ARM    . Total knee revision Right 12/15/2013    Procedure: RIGHT KNEE POLYETHYLENE  EXCHANGE;  Surgeon: Gearlean Alf, MD;  Location: [facility];  Service: Orthopedics;  Laterality: Right;    Social History   Social History  . Marital Status: Widowed    Spouse Name: N/A  . Number of Children: 1  . Years of Education: N/A   Occupational History  . retired    Social History Main Topics  . Smoking status: Never Smoker   . Smokeless tobacco: Never Used  . Alcohol Use: No  . Drug Use: No  . Sexual Activity: Not on file   Other Topics Concern  . Not on file   Social History Narrative    ROS: no fevers or  chills, productive cough, hemoptysis, dysphasia, odynophagia, melena, hematochezia, dysuria, hematuria, rash, seizure activity, orthopnea, PND, claudication. Remaining systems are negative.  Physical Exam: Well-developed well-nourished in no acute distress.  Skin is warm and dry.  HEENT is normal.  Neck is supple.  Chest is clear to auscultation with normal expansion.  Cardiovascular exam is regular rate and rhythm. 1/6 systolic ejection murmur Abdominal exam nontender or distended. No masses palpated. Extremities show trace edema. neuro grossly intact  ECG Sinus rhythm with occasional PVCs. Left  bundle branch block.

## 2015-03-24 ENCOUNTER — Ambulatory Visit (INDEPENDENT_AMBULATORY_CARE_PROVIDER_SITE_OTHER): Payer: Medicare Other | Admitting: Cardiology

## 2015-03-24 ENCOUNTER — Encounter: Payer: Self-pay | Admitting: Cardiology

## 2015-03-24 VITALS — BP 118/66 | HR 60 | Ht <= 58 in | Wt 129.1 lb

## 2015-03-24 DIAGNOSIS — I10 Essential (primary) hypertension: Secondary | ICD-10-CM

## 2015-03-24 DIAGNOSIS — E785 Hyperlipidemia, unspecified: Secondary | ICD-10-CM | POA: Diagnosis not present

## 2015-03-24 DIAGNOSIS — I2510 Atherosclerotic heart disease of native coronary artery without angina pectoris: Secondary | ICD-10-CM | POA: Diagnosis not present

## 2015-03-24 DIAGNOSIS — I2583 Coronary atherosclerosis due to lipid rich plaque: Secondary | ICD-10-CM

## 2015-03-24 NOTE — Assessment & Plan Note (Signed)
Patient's previous MI occurred in the setting of GI bleed and anemia. Since then she has had no chest pain. Previous Myoview was normal. Plan medical therapy. Continue aspirin, beta blocker and statin. 

## 2015-03-24 NOTE — Assessment & Plan Note (Signed)
Patient was describing pedal edema. This may be related to amlodipine.Discontinue. Lasix 20 mg daily as needed.

## 2015-03-24 NOTE — Assessment & Plan Note (Signed)
Continue statin. 

## 2015-03-24 NOTE — Assessment & Plan Note (Signed)
Blood pressure control. Possible contribution to pedal edema by amlodipine. Discontinue and follow blood pressure.

## 2015-03-24 NOTE — Patient Instructions (Signed)
Your physician wants you to follow-up in: 6 MONTHS WITH DR CRENSHAW You will receive a reminder letter in the mail two months in advance. If you don't receive a letter, please call our office to schedule the follow-up appointment.   If you need a refill on your cardiac medications before your next appointment, please call your pharmacy.  

## 2015-04-15 DIAGNOSIS — I10 Essential (primary) hypertension: Secondary | ICD-10-CM | POA: Diagnosis not present

## 2015-04-15 DIAGNOSIS — R42 Dizziness and giddiness: Secondary | ICD-10-CM | POA: Diagnosis not present

## 2015-04-17 DIAGNOSIS — R42 Dizziness and giddiness: Secondary | ICD-10-CM | POA: Diagnosis not present

## 2015-04-17 DIAGNOSIS — Z6826 Body mass index (BMI) 26.0-26.9, adult: Secondary | ICD-10-CM | POA: Diagnosis not present

## 2015-04-17 DIAGNOSIS — I10 Essential (primary) hypertension: Secondary | ICD-10-CM | POA: Diagnosis not present

## 2015-04-21 DIAGNOSIS — I10 Essential (primary) hypertension: Secondary | ICD-10-CM | POA: Diagnosis not present

## 2015-04-21 DIAGNOSIS — Z6826 Body mass index (BMI) 26.0-26.9, adult: Secondary | ICD-10-CM | POA: Diagnosis not present

## 2015-04-28 DIAGNOSIS — I10 Essential (primary) hypertension: Secondary | ICD-10-CM | POA: Diagnosis not present

## 2015-04-28 DIAGNOSIS — Z6826 Body mass index (BMI) 26.0-26.9, adult: Secondary | ICD-10-CM | POA: Diagnosis not present

## 2015-06-08 DIAGNOSIS — R6889 Other general symptoms and signs: Secondary | ICD-10-CM | POA: Diagnosis not present

## 2015-06-08 DIAGNOSIS — J208 Acute bronchitis due to other specified organisms: Secondary | ICD-10-CM | POA: Diagnosis not present

## 2015-06-08 DIAGNOSIS — Z6826 Body mass index (BMI) 26.0-26.9, adult: Secondary | ICD-10-CM | POA: Diagnosis not present

## 2015-07-21 DIAGNOSIS — M79662 Pain in left lower leg: Secondary | ICD-10-CM | POA: Diagnosis not present

## 2015-07-21 DIAGNOSIS — M7989 Other specified soft tissue disorders: Secondary | ICD-10-CM | POA: Diagnosis not present

## 2015-07-21 DIAGNOSIS — Z6825 Body mass index (BMI) 25.0-25.9, adult: Secondary | ICD-10-CM | POA: Diagnosis not present

## 2015-07-21 DIAGNOSIS — Z86718 Personal history of other venous thrombosis and embolism: Secondary | ICD-10-CM | POA: Diagnosis not present

## 2015-07-21 DIAGNOSIS — E663 Overweight: Secondary | ICD-10-CM | POA: Diagnosis not present

## 2015-07-21 DIAGNOSIS — Z86711 Personal history of pulmonary embolism: Secondary | ICD-10-CM | POA: Diagnosis not present

## 2015-08-09 DIAGNOSIS — H524 Presbyopia: Secondary | ICD-10-CM | POA: Diagnosis not present

## 2015-08-09 DIAGNOSIS — H04123 Dry eye syndrome of bilateral lacrimal glands: Secondary | ICD-10-CM | POA: Diagnosis not present

## 2015-08-16 DIAGNOSIS — M79604 Pain in right leg: Secondary | ICD-10-CM | POA: Diagnosis not present

## 2015-08-16 DIAGNOSIS — M79605 Pain in left leg: Secondary | ICD-10-CM | POA: Diagnosis not present

## 2015-08-16 DIAGNOSIS — I8312 Varicose veins of left lower extremity with inflammation: Secondary | ICD-10-CM | POA: Diagnosis not present

## 2015-08-16 DIAGNOSIS — I8311 Varicose veins of right lower extremity with inflammation: Secondary | ICD-10-CM | POA: Diagnosis not present

## 2015-08-21 ENCOUNTER — Other Ambulatory Visit: Payer: Self-pay | Admitting: Internal Medicine

## 2015-08-21 DIAGNOSIS — N6320 Unspecified lump in the left breast, unspecified quadrant: Secondary | ICD-10-CM

## 2015-08-29 ENCOUNTER — Ambulatory Visit
Admission: RE | Admit: 2015-08-29 | Discharge: 2015-08-29 | Disposition: A | Discharge status: Home / Self Care | Payer: Medicare Other | Source: Ambulatory Visit | Attending: Internal Medicine | Admitting: Internal Medicine

## 2015-08-29 DIAGNOSIS — N6012 Diffuse cystic mastopathy of left breast: Secondary | ICD-10-CM | POA: Diagnosis not present

## 2015-08-29 DIAGNOSIS — N6320 Unspecified lump in the left breast, unspecified quadrant: Secondary | ICD-10-CM

## 2015-08-29 DIAGNOSIS — R928 Other abnormal and inconclusive findings on diagnostic imaging of breast: Secondary | ICD-10-CM | POA: Diagnosis not present

## 2015-09-27 DIAGNOSIS — E559 Vitamin D deficiency, unspecified: Secondary | ICD-10-CM | POA: Diagnosis not present

## 2015-09-27 DIAGNOSIS — I10 Essential (primary) hypertension: Secondary | ICD-10-CM | POA: Diagnosis not present

## 2015-09-27 DIAGNOSIS — I2510 Atherosclerotic heart disease of native coronary artery without angina pectoris: Secondary | ICD-10-CM | POA: Diagnosis not present

## 2015-09-27 DIAGNOSIS — Z6826 Body mass index (BMI) 26.0-26.9, adult: Secondary | ICD-10-CM | POA: Diagnosis not present

## 2015-09-27 DIAGNOSIS — D62 Acute posthemorrhagic anemia: Secondary | ICD-10-CM | POA: Diagnosis not present

## 2015-09-27 DIAGNOSIS — E785 Hyperlipidemia, unspecified: Secondary | ICD-10-CM | POA: Diagnosis not present

## 2015-10-04 ENCOUNTER — Encounter: Payer: Self-pay | Admitting: Cardiology

## 2015-11-02 ENCOUNTER — Encounter: Payer: Self-pay | Admitting: Cardiology

## 2015-11-06 IMAGING — CR DG CHEST 2V
2 series · 2 of 2 positions shown · non-contrast
Comparison: 05/23/2011

CLINICAL DATA: Preoperative evaluation for RIGHT knee surgery,
history hypertension vomiting GERD, coronary artery disease, bladder
cancer

EXAM:
CHEST  2 VIEW

[w chest pa]
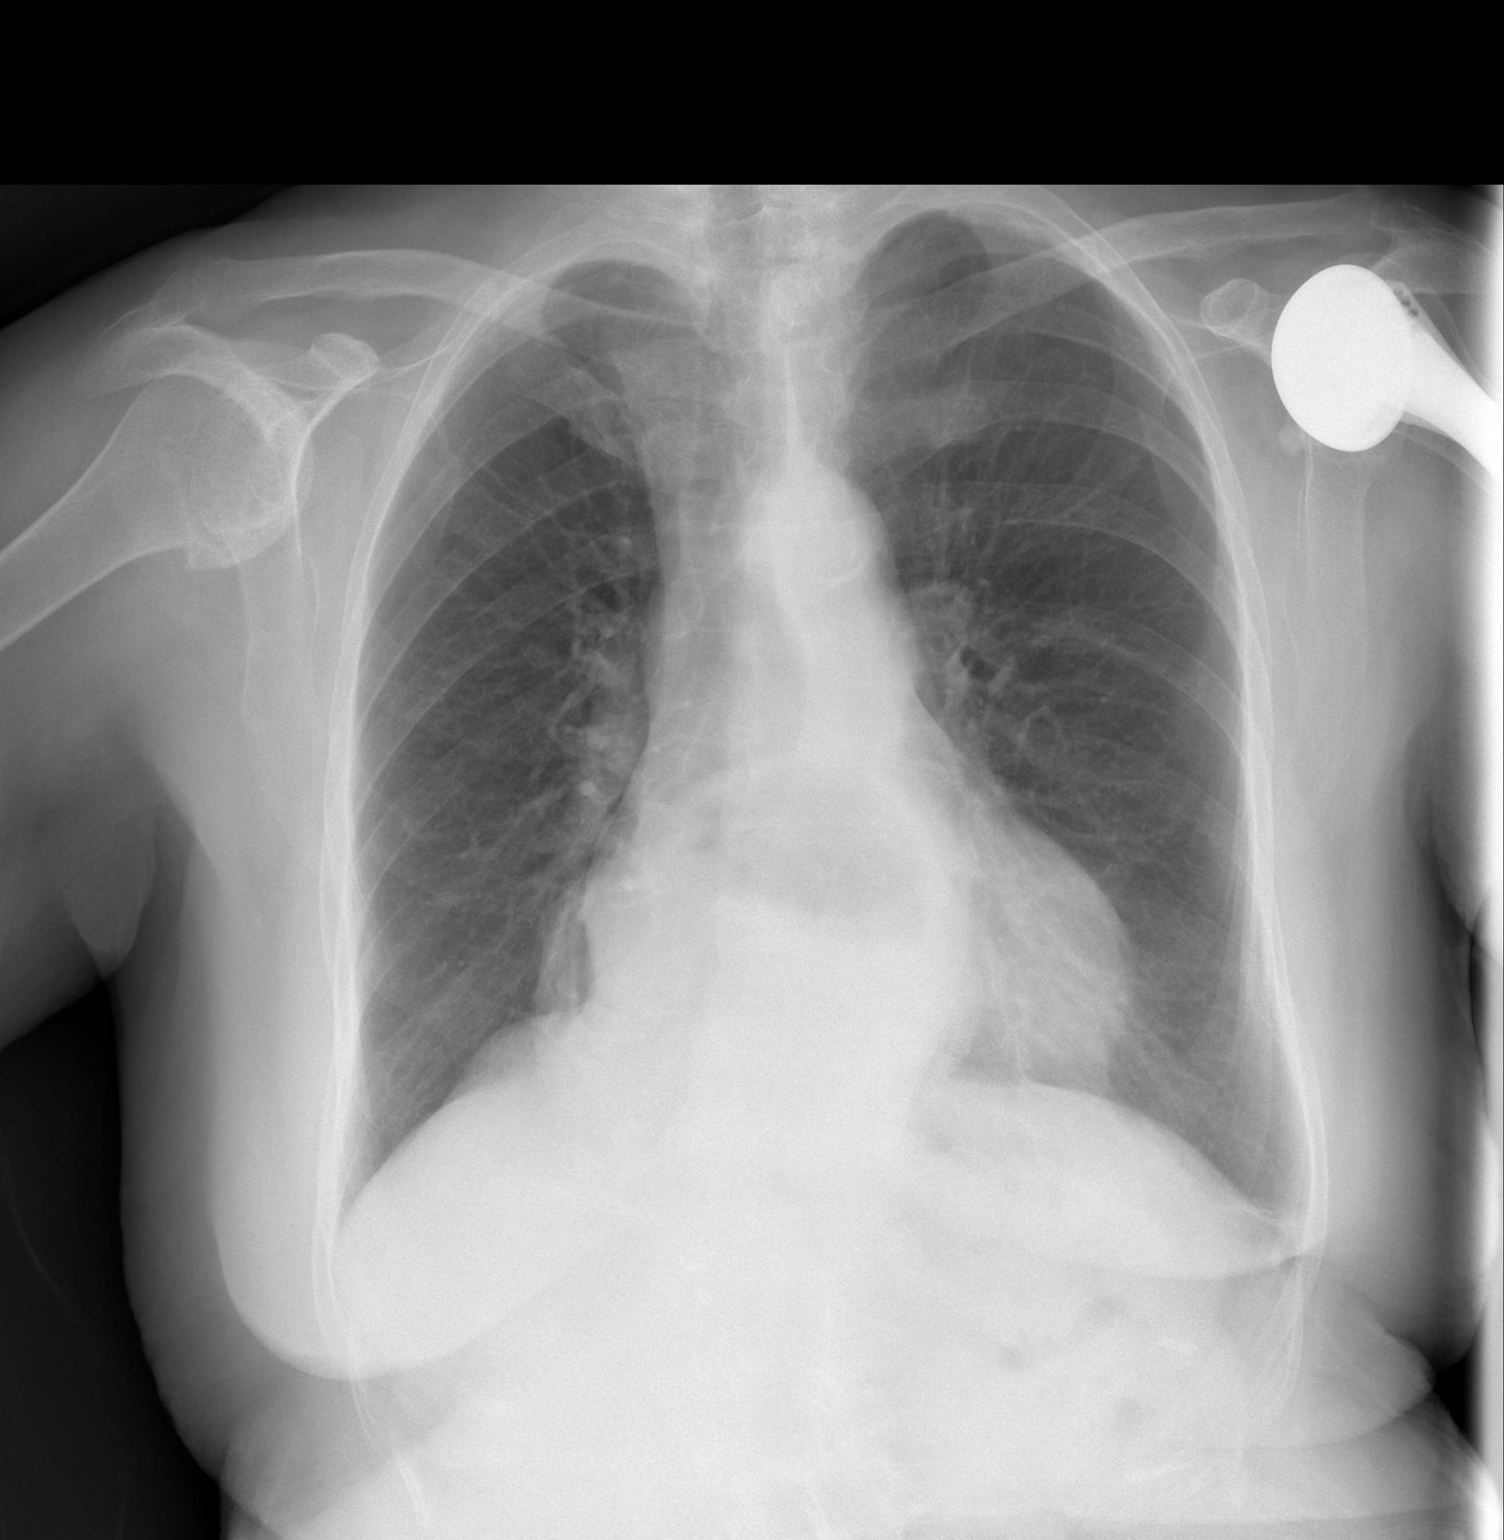

[w chest lat]
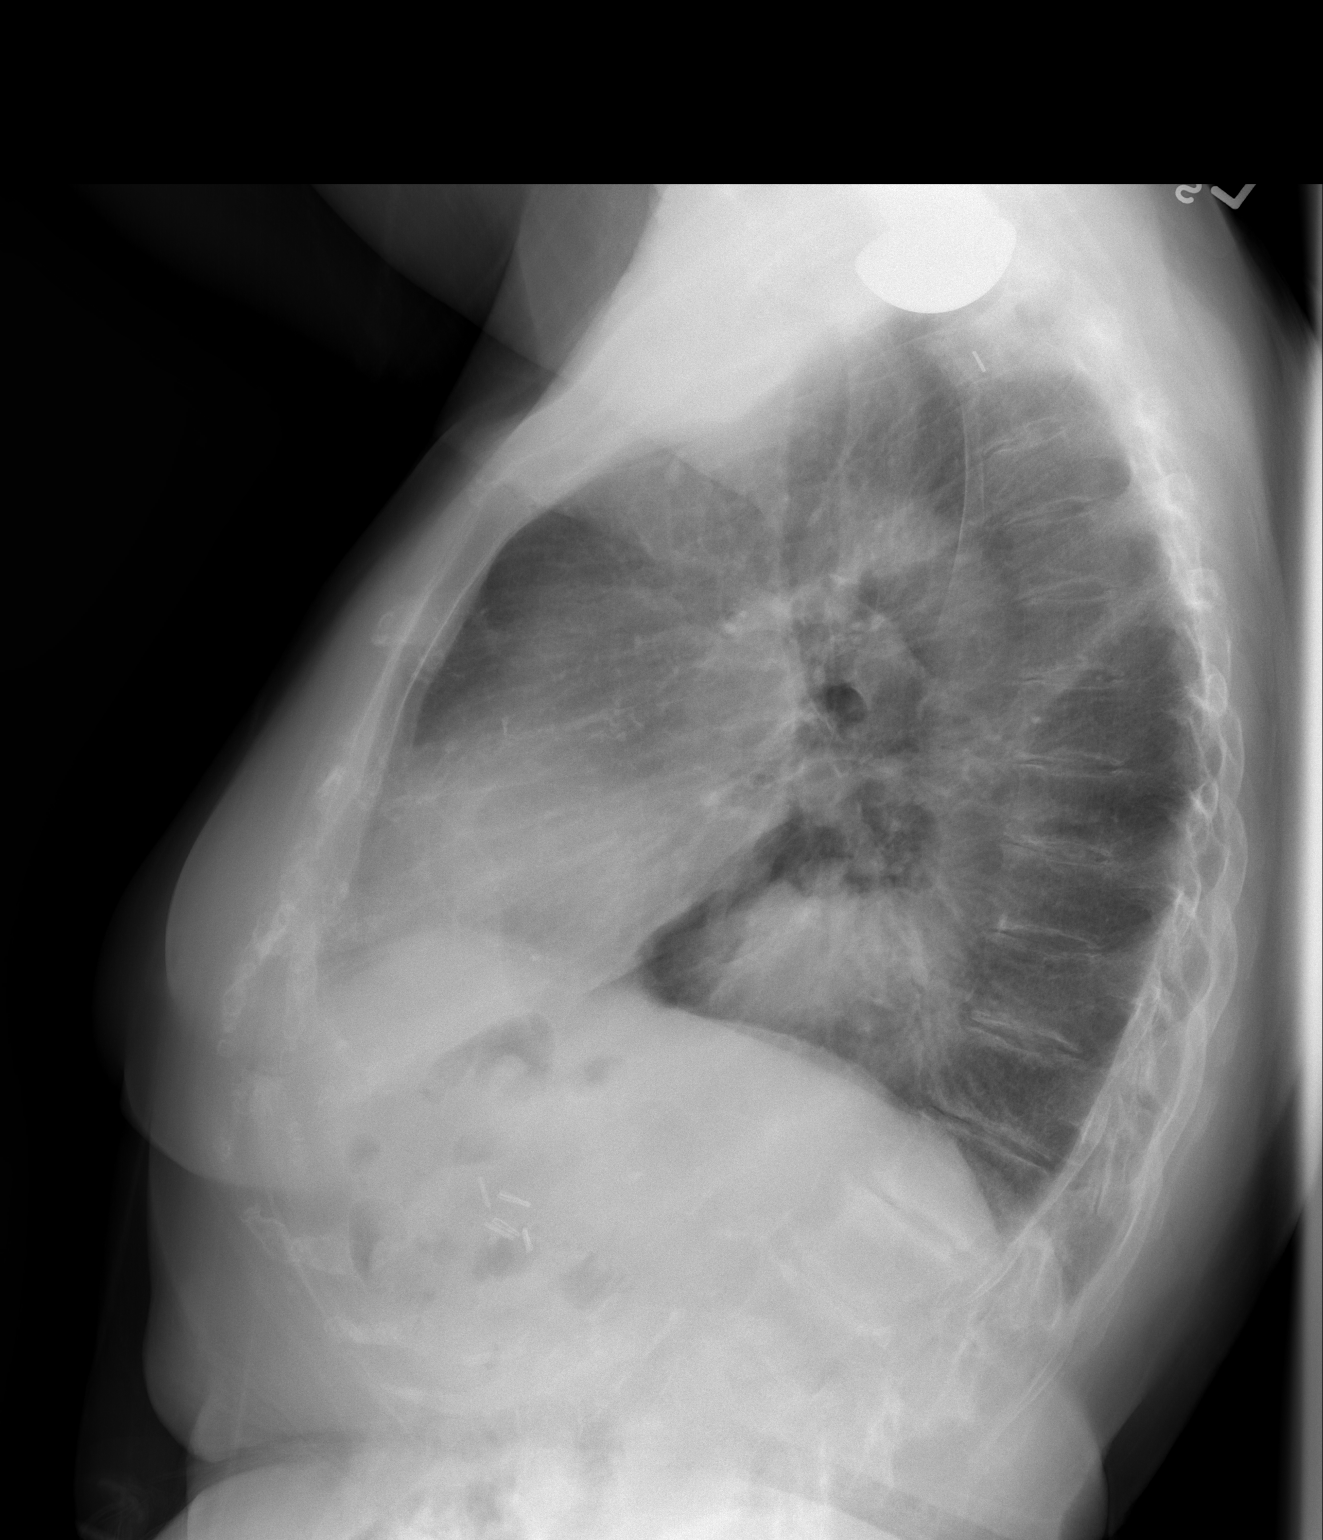

[2 of 2 positions shown; findings below may reference images not displayed]

FINDINGS: Enlargement of cardiac silhouette.

Calcified mildly tortuous thoracic aorta.

Large hiatal hernia.

Pulmonary vascularity normal.

Lungs clear.

No pleural effusion or pneumothorax.

LEFT shoulder prosthesis with RIGHT glenohumeral degenerative
changes noted.

Osseous demineralization.
IMPRESSION: Enlargement of cardiac silhouette.

Large hiatal hernia.

No acute abnormalities.

## 2015-11-15 NOTE — Progress Notes (Signed)
HPI: FU CAD. She is known to have left bundle branch block. Patient had left shoulder surgery in February 2013. She then developed a GI bleed and was readmitted in March. EGD revealed an ulcer and esophagitis. Cardiac markers were checked and her troponin was elevated. An echocardiogram showed an ejection fraction of 55%. There was mild hypokinesis of the inferior and inferoseptal myocardium. There was trace aortic insufficiency. There was mild to moderate mitral regurgitation. The left atrium was mildly dilated there was mild tricuspid regurgitation. After she recovered from her GI bleed a nuclear was performed in June of 2013 and this revealed an ejection fraction of 70% and normal perfusion. Since I last saw her, PatientWas seen in the [facility] emergency room recently for elevated blood pressure. She was placed on hydralazine. She apparently had negative MRI. No records available. She has had difficulties with pedal edema and is now wearing compression hose. She denies dyspnea, chest pain or syncope.   Current Outpatient Prescriptions  Medication Sig Dispense Refill  . ALPRAZolam (XANAX) 0.5 MG tablet Take 0.5 mg by mouth 2 (two) times daily as needed (tremors).     . Ascorbic Acid (VITAMIN C) 1000 MG tablet Take 1,000 mg by mouth daily.    Marland Kitchen aspirin 81 MG tablet Take 81 mg by mouth daily.    . Cholecalciferol (VITAMIN D3) 2000 UNITS TABS Take by mouth daily.    . furosemide (LASIX) 20 MG tablet Take 1 tablet (20 mg total) by mouth daily as needed. 30 tablet 6  . hydrALAZINE (APRESOLINE) 25 MG tablet Take 25 mg by mouth 3 (three) times daily.    Marland Kitchen ibandronate (BONIVA) 150 MG tablet   12  . meclizine (ANTIVERT) 25 MG tablet Take 25 mg by mouth 3 (three) times daily as needed for dizziness.    . Melatonin 3 MG TABS Take by mouth at bedtime.    . metoprolol tartrate (LOPRESSOR) 25 MG tablet Take 25 mg by mouth 2 (two) times daily. Take one tablet in the morning and one at night.    Marland Kitchen  omeprazole (PRILOSEC) 20 MG capsule Take 20 mg by mouth every morning.     . rosuvastatin (CRESTOR) 20 MG tablet Take 20 mg by mouth at bedtime.    . valsartan (DIOVAN) 320 MG tablet Take 320 mg by mouth every morning.     . vitamin B-12 (CYANOCOBALAMIN) 500 MCG tablet Take 500 mcg by mouth daily.     No current facility-administered medications for this visit.      Past Medical History:  Diagnosis Date  . Anemia    2013 transfusion times 3  . Anxiety   . Benign head tremor   . Bladder cancer (HCC) RECURRENT- HX TCC OF BLADDER  . Bronchitis    pt states has yearly  . Carpal tunnel syndrome on both sides   . Colon polyps    last colonoscopy when pt was around 59. No polyps reported but she did previously have small polyps  . Coronary artery disease CARDIOLOGIST- DR Chantelle Verdi-- LOV IN EPIC MAY 2013   MYOVIEW 08-20-2011--  NO ISCHEMIA-  IN EPIC  . Depressive disorder   . GERD (gastroesophageal reflux disease)   . H/O hiatal hernia    pt denies  . Hearing loss bilateral hearing aids  . History of GI bleed GASTRIC ULCER MARCH 2013--  BLOOD TRANSFUSION  . History of non-ST elevation myocardial infarction (NSTEMI) MARCH 2013--   +TROPONINS/  GI BLEED  MEDICAL MANAGEMENT pt states was informed she did not have a MI  . History of pulmonary embolism 2011  W/ THROMBOPHEBITIS-- RESOLVED  . HLD (hyperlipidemia)   . HTN (hypertension)   . LBBB (left bundle branch block)   . Numbness of fingers   . OA (osteoarthritis) HANDS AND JOINTS  . Osteoporosis   . Pleurisy    hx of   . Pneumonia    hx of   . PONV (postoperative nausea and vomiting)   . S/P dilatation of esophageal stricture   . SUI (stress urinary incontinence, female)     Past Surgical History:  Procedure Laterality Date  . APPENDECTOMY  1970   AND TUBAL LIGATION  . CARDIOVASCULAR STRESS TEST  08-20-2011  DR Elgin Carn   NORMAL NUCLEAR STUDY (NO EXERCISE)/ BASELINE LBBB/ NO ISCHEMIA/ EF 70%/ LV WALL MOTION  AND FUNCTION  NORMAL  . CARPAL TUNNEL RELEASE     BILATERAL  . CATARACT EXTRACTION W/ INTRAOCULAR LENS  IMPLANT, BILATERAL    . CYSTO/ BILATERAL RETROGRADE PYELOGRAM/ FULGERATION BLADDER TUMOR/ LEFT URETEROSCOPY  02-28-2009  DR PETERSON  . CYSTOSCOPY W/ RETROGRADES  12/16/2011   Procedure: CYSTOSCOPY WITH RETROGRADE PYELOGRAM;  Surgeon: Franchot Gallo, MD;  Location[facility];  Service: Urology;  Laterality: Bilateral;  45 mins requested for this case  C-ARM    . ESOPHAGOGASTRODUODENOSCOPY  05/23/2011   Procedure: ESOPHAGOGASTRODUODENOSCOPY (EGD);  Surgeon: Irene Shipper, MD;  Location: [facility] ENDOSCOPY;  Service: Endoscopy;  Laterality: N/A;  . KNEE ARTHROSCOPY  05   rt  . LAPAROSCOPIC CHOLECYSTECTOMY  1998  . RIGHT TOTAL KNEE REPLACEMENT  01-02-2004  . TOTAL HIP ARTHROPLASTY  07-01-2003   RIGHT HIP  . TOTAL KNEE ARTHROPLASTY  11-30-2007   LEFT KNEE  . TOTAL KNEE REVISION Right 12/15/2013   Procedure: RIGHT KNEE POLYETHYLENE  EXCHANGE;  Surgeon: Gearlean Alf, MD;  Location: [facility];  Service: Orthopedics;  Laterality: Right;  . TOTAL SHOULDER ARTHROPLASTY  05/09/2011   Procedure: TOTAL SHOULDER ARTHROPLASTY;  Surgeon: Marin Shutter, MD;  Location: [place];  Service: Orthopedics;  Laterality: Left;  Left Shoulder Total Arthroplasty   . TRANSTHORACIC ECHOCARDIOGRAM  05-24-2011   MILD LVH/ GRADE I DIASTOLIC DYSFUNTION/ EF 123456 MILD HYPOKINESIS/ MILD TO MODERATE MITRAL REGURG./ MILD TRICUSPID REGURG./ MODERATE INCREASE SYSTOLIC PRESSURE, PULMONARY ARTERIES  . TRANSURETHRAL RESECTION OF BLADDER TUMOR  06-04-2005  &  11-09-2002   LOW GRADE NON-INVASIVE TRANSITIONAL CELL CARCINOMA    Social History   Social History  . Marital status: Widowed    Spouse name: N/A  . Number of children: 1  . Years of education: N/A   Occupational History  . retired    Social History Main Topics  . Smoking status: Never Smoker  . Smokeless tobacco: Never Used  . Alcohol use No  . Drug use: No  .  Sexual activity: Not on file   Other Topics Concern  . Not on file   Social History Narrative  . No narrative on file    Family History  Problem Relation Age of Onset  . Heart disease    . Hypertension    . Heart attack    . Stroke      ROS: Pain in lower extremities but no fevers or chills, productive cough, hemoptysis, dysphasia, odynophagia, melena, hematochezia, dysuria, hematuria, rash, seizure activity, orthopnea, PND, claudication. Remaining systems are negative.  Physical Exam: Well-developed well-nourished in no acute distress.  Skin is warm and dry.  HEENT  is normal.  Neck is supple.  Chest is clear to auscultation with normal expansion.  Cardiovascular exam is regular rate and rhythm.  Abdominal exam nontender or distended. No masses palpated. Extremities show no edema. neuro grossly intact  ECG- Sinus rhythm, left bundle branch block.  A/P  1 Coronary artery disease-previous MI in the setting of GI bleed and anemia.Previous nuclear study however normal and no chest pain. Plan medical therapy including aspirin, beta blocker and statin.  2 edema-Likely venous insufficiency. She will continue compression hose. We discussed keeping her feet elevated at night. Continue Lasix as needed. We will have her most recent laboratories forwarded to [place] including potassium and renal function.  3 hyperlipidemia-continue statin.  4 hypertension-blood pressure appears to be controlled with the addition of hydralazine. She will monitor this at home and we will increase as needed.  Kirk Ruths, MD

## 2015-11-16 ENCOUNTER — Encounter: Payer: Self-pay | Admitting: Cardiology

## 2015-11-16 ENCOUNTER — Ambulatory Visit (INDEPENDENT_AMBULATORY_CARE_PROVIDER_SITE_OTHER): Payer: Medicare Other | Admitting: Cardiology

## 2015-11-16 VITALS — BP 146/72 | HR 60 | Ht <= 58 in | Wt 123.0 lb

## 2015-11-16 DIAGNOSIS — I10 Essential (primary) hypertension: Secondary | ICD-10-CM | POA: Diagnosis not present

## 2015-11-16 DIAGNOSIS — E785 Hyperlipidemia, unspecified: Secondary | ICD-10-CM | POA: Diagnosis not present

## 2015-11-16 DIAGNOSIS — I2510 Atherosclerotic heart disease of native coronary artery without angina pectoris: Secondary | ICD-10-CM | POA: Diagnosis not present

## 2015-11-16 DIAGNOSIS — R609 Edema, unspecified: Secondary | ICD-10-CM | POA: Diagnosis not present

## 2015-11-16 NOTE — Patient Instructions (Signed)
Medication Instructions:   NO CHANGES.  Labwork: Labwork will be requested from your primary care physician.   Follow-Up: Your physician wants you to follow-up in: [place]. You will receive a reminder letter in the mail two months in advance. If you don't receive a letter, please call our office to schedule the follow-up appointment.   If you need a refill on your cardiac medications before your next appointment, please call your pharmacy.

## 2015-12-13 ENCOUNTER — Other Ambulatory Visit: Payer: Self-pay | Admitting: Internal Medicine

## 2015-12-13 DIAGNOSIS — Z1231 Encounter for screening mammogram for malignant neoplasm of breast: Secondary | ICD-10-CM

## 2015-12-29 ENCOUNTER — Ambulatory Visit: Payer: Medicare Other

## 2016-01-02 ENCOUNTER — Ambulatory Visit
Admission: RE | Admit: 2016-01-02 | Discharge: 2016-01-02 | Disposition: A | Discharge status: Home / Self Care | Payer: Medicare Other | Source: Ambulatory Visit | Attending: Internal Medicine | Admitting: Internal Medicine

## 2016-01-02 DIAGNOSIS — Z1231 Encounter for screening mammogram for malignant neoplasm of breast: Secondary | ICD-10-CM | POA: Diagnosis not present

## 2016-01-26 DIAGNOSIS — Z23 Encounter for immunization: Secondary | ICD-10-CM | POA: Diagnosis not present

## 2016-04-02 ENCOUNTER — Encounter: Payer: Self-pay | Admitting: Cardiology

## 2016-04-02 DIAGNOSIS — Z6825 Body mass index (BMI) 25.0-25.9, adult: Secondary | ICD-10-CM | POA: Diagnosis not present

## 2016-04-02 DIAGNOSIS — D62 Acute posthemorrhagic anemia: Secondary | ICD-10-CM | POA: Diagnosis not present

## 2016-04-02 DIAGNOSIS — Z9181 History of falling: Secondary | ICD-10-CM | POA: Diagnosis not present

## 2016-04-02 DIAGNOSIS — I2510 Atherosclerotic heart disease of native coronary artery without angina pectoris: Secondary | ICD-10-CM | POA: Diagnosis not present

## 2016-04-02 DIAGNOSIS — Z1389 Encounter for screening for other disorder: Secondary | ICD-10-CM | POA: Diagnosis not present

## 2016-04-02 DIAGNOSIS — E559 Vitamin D deficiency, unspecified: Secondary | ICD-10-CM | POA: Diagnosis not present

## 2016-04-02 DIAGNOSIS — E785 Hyperlipidemia, unspecified: Secondary | ICD-10-CM | POA: Diagnosis not present

## 2016-04-02 DIAGNOSIS — I10 Essential (primary) hypertension: Secondary | ICD-10-CM | POA: Diagnosis not present

## 2016-04-22 NOTE — Progress Notes (Signed)
HPI: FU CAD. She is known to have left bundle branch block. Patient had left shoulder surgery in February 2013. She then developed a GI bleed and was readmitted in March. EGD revealed an ulcer and esophagitis. Cardiac markers were checked and her troponin was elevated. An echocardiogram showed an ejection fraction of 55%. There was mild hypokinesis of the inferior and inferoseptal myocardium. There was trace aortic insufficiency. There was mild to moderate mitral regurgitation. The left atrium was mildly dilated there was mild tricuspid regurgitation. After she recovered from her GI bleed a nuclear was performed in June of 2013 and this revealed an ejection fraction of 70% and normal perfusion. Since I last saw her, she denies dyspnea, chest pain, palpitations or syncope.   Current Outpatient Prescriptions  Medication Sig Dispense Refill  . ALPRAZolam (XANAX) 0.5 MG tablet Take 0.5 mg by mouth 2 (two) times daily as needed (tremors).     . Ascorbic Acid (VITAMIN C) 1000 MG tablet Take 1,000 mg by mouth daily.    Marland Kitchen aspirin 81 MG tablet Take 81 mg by mouth daily.    . Cholecalciferol (VITAMIN D3) 2000 UNITS TABS Take by mouth daily.    . furosemide (LASIX) 20 MG tablet Take 1 tablet (20 mg total) by mouth daily as needed. 30 tablet 6  . hydrALAZINE (APRESOLINE) 25 MG tablet Take 25 mg by mouth 3 (three) times daily.    . meclizine (ANTIVERT) 25 MG tablet Take 25 mg by mouth 3 (three) times daily as needed for dizziness.    . Melatonin 3 MG TABS Take by mouth at bedtime.    . metoprolol tartrate (LOPRESSOR) 25 MG tablet Take 25 mg by mouth 2 (two) times daily. Take one tablet in the morning and one at night.    Marland Kitchen omeprazole (PRILOSEC) 20 MG capsule Take 20 mg by mouth every morning.     . rosuvastatin (CRESTOR) 20 MG tablet Take 20 mg by mouth at bedtime.    . valsartan (DIOVAN) 320 MG tablet Take 320 mg by mouth every morning.     . vitamin B-12 (CYANOCOBALAMIN) 500 MCG tablet Take 500 mcg by  mouth daily.     No current facility-administered medications for this visit.      Past Medical History:  Diagnosis Date  . Anemia    2013 transfusion times 3  . Anxiety   . Benign head tremor   . Bladder cancer (HCC) RECURRENT- HX TCC OF BLADDER  . Bronchitis    pt states has yearly  . Carpal tunnel syndrome on both sides   . Colon polyps    last colonoscopy when pt was around 80. No polyps reported but she did previously have small polyps  . Coronary artery disease CARDIOLOGIST- DR Raeden Schippers-- LOV IN EPIC MAY 2013   MYOVIEW 08-20-2011--  NO ISCHEMIA-  IN EPIC  . Depressive disorder   . GERD (gastroesophageal reflux disease)   . H/O hiatal hernia    pt denies  . Hearing loss bilateral hearing aids  . History of GI bleed GASTRIC ULCER MARCH 2013--  BLOOD TRANSFUSION  . History of non-ST elevation myocardial infarction (NSTEMI) MARCH 2013--   +TROPONINS/  GI BLEED    MEDICAL MANAGEMENT pt states was informed she did not have a MI  . History of pulmonary embolism 2011  W/ THROMBOPHEBITIS-- RESOLVED  . HLD (hyperlipidemia)   . HTN (hypertension)   . LBBB (left bundle branch block)   . Numbness of  fingers   . OA (osteoarthritis) HANDS AND JOINTS  . Osteoporosis   . Pleurisy    hx of   . Pneumonia    hx of   . PONV (postoperative nausea and vomiting)   . S/P dilatation of esophageal stricture   . SUI (stress urinary incontinence, female)     Past Surgical History:  Procedure Laterality Date  . APPENDECTOMY  1970   AND TUBAL LIGATION  . CARDIOVASCULAR STRESS TEST  08-20-2011  DR Humbert Morozov   NORMAL NUCLEAR STUDY (NO EXERCISE)/ BASELINE LBBB/ NO ISCHEMIA/ EF 70%/ LV WALL MOTION  AND FUNCTION NORMAL  . CARPAL TUNNEL RELEASE     BILATERAL  . CATARACT EXTRACTION W/ INTRAOCULAR LENS  IMPLANT, BILATERAL    . CYSTO/ BILATERAL RETROGRADE PYELOGRAM/ FULGERATION BLADDER TUMOR/ LEFT URETEROSCOPY  02-28-2009  DR PETERSON  . CYSTOSCOPY W/ RETROGRADES  12/16/2011   Procedure:  CYSTOSCOPY WITH RETROGRADE PYELOGRAM;  Surgeon: Franchot Gallo, MD;  Location: [facility];  Service: Urology;  Laterality: Bilateral;  45 mins requested for this case  C-ARM    . ESOPHAGOGASTRODUODENOSCOPY  05/23/2011   Procedure: ESOPHAGOGASTRODUODENOSCOPY (EGD);  Surgeon: Irene Shipper, MD;  Location[facility] ENDOSCOPY;  Service: Endoscopy;  Laterality: N/A;  . KNEE ARTHROSCOPY  05   rt  . LAPAROSCOPIC CHOLECYSTECTOMY  1998  . RIGHT TOTAL KNEE REPLACEMENT  01-02-2004  . TOTAL HIP ARTHROPLASTY  07-01-2003   RIGHT HIP  . TOTAL KNEE ARTHROPLASTY  11-30-2007   LEFT KNEE  . TOTAL KNEE REVISION Right 12/15/2013   Procedure: RIGHT KNEE POLYETHYLENE  EXCHANGE;  Surgeon: Gearlean Alf, MD;  Location: [facility];  Service: Orthopedics;  Laterality: Right;  . TOTAL SHOULDER ARTHROPLASTY  05/09/2011   Procedure: TOTAL SHOULDER ARTHROPLASTY;  Surgeon: Marin Shutter, MD;  Location: [place];  Service: Orthopedics;  Laterality: Left;  Left Shoulder Total Arthroplasty   . TRANSTHORACIC ECHOCARDIOGRAM  05-24-2011   MILD LVH/ GRADE I DIASTOLIC DYSFUNTION/ EF 123456 MILD HYPOKINESIS/ MILD TO MODERATE MITRAL REGURG./ MILD TRICUSPID REGURG./ MODERATE INCREASE SYSTOLIC PRESSURE, PULMONARY ARTERIES  . TRANSURETHRAL RESECTION OF BLADDER TUMOR  06-04-2005  &  11-09-2002   LOW GRADE NON-INVASIVE TRANSITIONAL CELL CARCINOMA    Social History   Social History  . Marital status: Widowed    Spouse name: N/A  . Number of children: 1  . Years of education: N/A   Occupational History  . retired    Social History Main Topics  . Smoking status: Never Smoker  . Smokeless tobacco: Never Used  . Alcohol use No  . Drug use: No  . Sexual activity: Not on file   Other Topics Concern  . Not on file   Social History Narrative  . No narrative on file    Family History  Problem Relation Age of Onset  . Heart disease    . Hypertension    . Heart attack    . Stroke      ROS: no fevers or chills,  productive cough, hemoptysis, dysphasia, odynophagia, melena, hematochezia, dysuria, hematuria, rash, seizure activity, orthopnea, PND, pedal edema, claudication. Remaining systems are negative.  Physical Exam: Well-developed well-nourished in no acute distress.  Skin is warm and dry.  HEENT is normal.  Neck is supple.  Chest is clear to auscultation with normal expansion.  Cardiovascular exam is regular rate and rhythm. 2/6 SEM Abdominal exam nontender or distended. No masses palpated. Extremities show no edema. neuro grossly intact  ECG-sinus rhythm with occasional PVC. Left bundle  branch block.  A/P  1 coronary artery disease-continue aspirin, beta blocker and statin. Patient had a previous myocardial infarction via enzymes in the setting of GI bleed and anemia but follow-up nuclear study negative. We are treating medically.  2 hyperlipidemia-continue statin. Lipids and liver monitored by primary care.  3 hypertension-blood pressure elevated but she states typically controlled. Continue present medications and follow.  4 edema-Continue Lasix as needed. Potassium and renal function monitored by primary care.  Kirk Ruths, MD

## 2016-04-26 ENCOUNTER — Ambulatory Visit (INDEPENDENT_AMBULATORY_CARE_PROVIDER_SITE_OTHER): Payer: Medicare Other | Admitting: Cardiology

## 2016-04-26 ENCOUNTER — Encounter: Payer: Self-pay | Admitting: Cardiology

## 2016-04-26 VITALS — BP 148/86 | HR 62 | Ht <= 58 in | Wt 119.0 lb

## 2016-04-26 DIAGNOSIS — I10 Essential (primary) hypertension: Secondary | ICD-10-CM

## 2016-04-26 DIAGNOSIS — E7800 Pure hypercholesterolemia, unspecified: Secondary | ICD-10-CM | POA: Diagnosis not present

## 2016-04-26 DIAGNOSIS — I2510 Atherosclerotic heart disease of native coronary artery without angina pectoris: Secondary | ICD-10-CM

## 2016-04-26 NOTE — Patient Instructions (Signed)
Your physician wants you to follow-up in: ONE YEAR WITH DR CRENSHAW You will receive a reminder letter in the mail two months in advance. If you don't receive a letter, please call our office to schedule the follow-up appointment.   If you need a refill on your cardiac medications before your next appointment, please call your pharmacy.  

## 2016-05-13 ENCOUNTER — Encounter: Payer: Self-pay | Admitting: Cardiology

## 2016-05-13 ENCOUNTER — Ambulatory Visit (INDEPENDENT_AMBULATORY_CARE_PROVIDER_SITE_OTHER): Payer: Medicare Other | Admitting: Cardiology

## 2016-05-13 ENCOUNTER — Other Ambulatory Visit: Payer: Self-pay | Admitting: Cardiology

## 2016-05-13 VITALS — BP 162/76 | HR 58 | Ht <= 58 in | Wt 119.0 lb

## 2016-05-13 DIAGNOSIS — I2510 Atherosclerotic heart disease of native coronary artery without angina pectoris: Secondary | ICD-10-CM

## 2016-05-13 DIAGNOSIS — I10 Essential (primary) hypertension: Secondary | ICD-10-CM | POA: Diagnosis not present

## 2016-05-13 MED ORDER — HYDRALAZINE HCL 50 MG PO TABS: 50.0000 mg | ORAL_TABLET | Freq: Three times a day (TID) | ORAL | 3 refills | Status: AC

## 2016-05-13 NOTE — Progress Notes (Signed)
HPI: FU CAD. She is known to have left bundle branch block. Patient had left shoulder surgery in February 2013. She then developed a GI bleed and was readmitted in March. EGD revealed an ulcer and esophagitis. Cardiac markers were checked and her troponin was elevated. An echocardiogram showed an ejection fraction of 55%. There was mild hypokinesis of the inferior and inferoseptal myocardium. There was trace aortic insufficiency. There was mild to moderate mitral regurgitation. The left atrium was mildly dilated there was mild tricuspid regurgitation. After she recovered from her GI bleed a nuclear was performed in June of 2013 and this revealed an ejection fraction of 70% and normal perfusion. Since I last saw her, she denies dyspnea, chest pain, palpitations or syncope. However she was seen recently at urgent care with elevated blood pressure. Her metoprolol was increased to 50 mg twice a day. Her pressure still is running high with systolic of 99991111 and diastolic of 123456.  Current Outpatient Prescriptions  Medication Sig Dispense Refill  . ALPRAZolam (XANAX) 0.5 MG tablet Take 0.5 mg by mouth 2 (two) times daily as needed (tremors).     . Ascorbic Acid (VITAMIN C) 1000 MG tablet Take 1,000 mg by mouth daily.    Marland Kitchen aspirin 81 MG tablet Take 81 mg by mouth daily.    . Cholecalciferol (VITAMIN D3) 2000 UNITS TABS Take by mouth daily.    . furosemide (LASIX) 20 MG tablet Take 1 tablet (20 mg total) by mouth daily as needed. 30 tablet 6  . hydrALAZINE (APRESOLINE) 25 MG tablet Take 25 mg by mouth 3 (three) times daily.    . meclizine (ANTIVERT) 25 MG tablet Take 25 mg by mouth 3 (three) times daily as needed for dizziness.    . Melatonin 3 MG TABS Take by mouth at bedtime.    . metoprolol tartrate (LOPRESSOR) 25 MG tablet Take 25 mg by mouth 2 (two) times daily. Take one tablet in the morning and one at night.    Marland Kitchen omeprazole (PRILOSEC) 20 MG capsule Take 20 mg by mouth every morning.     .  rosuvastatin (CRESTOR) 20 MG tablet Take 20 mg by mouth at bedtime.    . valsartan (DIOVAN) 320 MG tablet Take 320 mg by mouth every morning.     . vitamin B-12 (CYANOCOBALAMIN) 500 MCG tablet Take 500 mcg by mouth daily.     No current facility-administered medications for this visit.      Past Medical History:  Diagnosis Date  . Anemia    2013 transfusion times 3  . Anxiety   . Benign head tremor   . Bladder cancer (HCC) RECURRENT- HX TCC OF BLADDER  . Bronchitis    pt states has yearly  . Carpal tunnel syndrome on both sides   . Colon polyps    last colonoscopy when pt was around 73. No polyps reported but she did previously have small polyps  . Coronary artery disease CARDIOLOGIST- DR Sheyli Horwitz-- LOV IN EPIC MAY 2013   MYOVIEW 08-20-2011--  NO ISCHEMIA-  IN EPIC  . Depressive disorder   . GERD (gastroesophageal reflux disease)   . H/O hiatal hernia    pt denies  . Hearing loss bilateral hearing aids  . History of GI bleed GASTRIC ULCER MARCH 2013--  BLOOD TRANSFUSION  . History of non-ST elevation myocardial infarction (NSTEMI) MARCH 2013--   +TROPONINS/  GI BLEED    MEDICAL MANAGEMENT pt states was informed she did not have  a MI  . History of pulmonary embolism 2011  W/ THROMBOPHEBITIS-- RESOLVED  . HLD (hyperlipidemia)   . HTN (hypertension)   . LBBB (left bundle branch block)   . Numbness of fingers   . OA (osteoarthritis) HANDS AND JOINTS  . Osteoporosis   . Pleurisy    hx of   . Pneumonia    hx of   . PONV (postoperative nausea and vomiting)   . S/P dilatation of esophageal stricture   . SUI (stress urinary incontinence, female)     Past Surgical History:  Procedure Laterality Date  . APPENDECTOMY  1970   AND TUBAL LIGATION  . CARDIOVASCULAR STRESS TEST  08-20-2011  DR Laakea Pereira   NORMAL NUCLEAR STUDY (NO EXERCISE)/ BASELINE LBBB/ NO ISCHEMIA/ EF 70%/ LV WALL MOTION  AND FUNCTION NORMAL  . CARPAL TUNNEL RELEASE     BILATERAL  . CATARACT EXTRACTION W/  INTRAOCULAR LENS  IMPLANT, BILATERAL    . CYSTO/ BILATERAL RETROGRADE PYELOGRAM/ FULGERATION BLADDER TUMOR/ LEFT URETEROSCOPY  02-28-2009  DR PETERSON  . CYSTOSCOPY W/ RETROGRADES  12/16/2011   Procedure: CYSTOSCOPY WITH RETROGRADE PYELOGRAM;  Surgeon: Franchot Gallo, MD;  Location: [facility];  Service: Urology;  Laterality: Bilateral;  45 mins requested for this case  C-ARM    . ESOPHAGOGASTRODUODENOSCOPY  05/23/2011   Procedure: ESOPHAGOGASTRODUODENOSCOPY (EGD);  Surgeon: Irene Shipper, MD;  Location: [facility] ENDOSCOPY;  Service: Endoscopy;  Laterality: N/A;  . KNEE ARTHROSCOPY  05   rt  . LAPAROSCOPIC CHOLECYSTECTOMY  1998  . RIGHT TOTAL KNEE REPLACEMENT  01-02-2004  . TOTAL HIP ARTHROPLASTY  07-01-2003   RIGHT HIP  . TOTAL KNEE ARTHROPLASTY  11-30-2007   LEFT KNEE  . TOTAL KNEE REVISION Right 12/15/2013   Procedure: RIGHT KNEE POLYETHYLENE  EXCHANGE;  Surgeon: Gearlean Alf, MD;  Location: [facility];  Service: Orthopedics;  Laterality: Right;  . TOTAL SHOULDER ARTHROPLASTY  05/09/2011   Procedure: TOTAL SHOULDER ARTHROPLASTY;  Surgeon: Marin Shutter, MD;  Location: [place];  Service: Orthopedics;  Laterality: Left;  Left Shoulder Total Arthroplasty   . TRANSTHORACIC ECHOCARDIOGRAM  05-24-2011   MILD LVH/ GRADE I DIASTOLIC DYSFUNTION/ EF 123456 MILD HYPOKINESIS/ MILD TO MODERATE MITRAL REGURG./ MILD TRICUSPID REGURG./ MODERATE INCREASE SYSTOLIC PRESSURE, PULMONARY ARTERIES  . TRANSURETHRAL RESECTION OF BLADDER TUMOR  06-04-2005  &  11-09-2002   LOW GRADE NON-INVASIVE TRANSITIONAL CELL CARCINOMA    Social History   Social History  . Marital status: Widowed    Spouse name: N/A  . Number of children: 1  . Years of education: N/A   Occupational History  . retired    Social History Main Topics  . Smoking status: Never Smoker  . Smokeless tobacco: Never Used  . Alcohol use No  . Drug use: No  . Sexual activity: Not on file   Other Topics Concern  . Not on file    Social History Narrative  . No narrative on file    Family History  Problem Relation Age of Onset  . Heart disease    . Hypertension    . Heart attack    . Stroke      ROS: no fevers or chills, productive cough, hemoptysis, dysphasia, odynophagia, melena, hematochezia, dysuria, hematuria, rash, seizure activity, orthopnea, PND, pedal edema, claudication. Remaining systems are negative.  Physical Exam: Well-developed well-nourished in no acute distress.  Skin is warm and dry.  HEENT is normal.  Neck is supple. No bruits Chest is clear to auscultation  with normal expansion.  Cardiovascular exam is regular rate and rhythm.  Abdominal exam nontender or distended. No masses palpated. Extremities show no edema. neuro grossly intact  A/P  1 coronary artery disease-continue aspirin, beta blocker and statin. Patient had a previous myocardial infarction via enzymes in the setting of GI bleed and anemia but follow-up nuclear study negative. We are treating medically.  2 hyperlipidemia-continue statin. Lipids and liver monitored by primary care.  3 hypertension-blood pressure elevated. I will continue metoprolol at 50 mg twice a day. Increase hydralazine to 50 mg by mouth 3 times a day. She states amlodipine caused pedal edema previously. She will follow-up in the hypertension clinic in 2 weeks for further medication titration. I have asked her to bring her blood pressure cuff to correlate with ours. She will also keep records of her blood pressure at home.  4 edema-Continue Lasix as needed. Potassium and renal function monitored by primary care.  Kirk Ruths, MD

## 2016-05-13 NOTE — Patient Instructions (Signed)
Medication Instructions:   INCREASE HYDRALAZINE TO 50 MG THREE TIMES DAILY= 2 OF THE 25 MG TABLETS THREE TIMES DAILY  CHANGE METOPROLOL TO 50 MG TWICE DAILY= 2 OF THE 25 MG TABLETS TWICE DAILY  Follow-Up:  Your physician recommends that you schedule a follow-up appointment in: [place] 2 WEEKS= BRING HOME BP CUFF TO APPOINTMENT  Your physician recommends that you schedule a follow-up appointment in: [place]

## 2016-05-30 ENCOUNTER — Ambulatory Visit (INDEPENDENT_AMBULATORY_CARE_PROVIDER_SITE_OTHER): Payer: Medicare Other | Admitting: Pharmacist

## 2016-05-30 VITALS — BP 148/72 | HR 58

## 2016-05-30 DIAGNOSIS — I10 Essential (primary) hypertension: Secondary | ICD-10-CM

## 2016-05-30 NOTE — Progress Notes (Signed)
Patient ID: QUIDA GLASSER                 DOB: 12-31-1925                      MRN: [id number]     HPI:  Christina [name] is a 83 y.o. female referred by Dr. Stanford Breed to HTN clinic. PMH includes hypertension for > 20 years, LBBB, NSTEMI and CAD.  Was seen by Urgent Care about 4 weeks ago with elevated blood pressure and Metoprolol dose was increased from 25mg  BID to 50mg  BID.  During follow up visit with Dr Stanford Breed on 05/13/16 her hydralazine dose was also increased from 25mg  to 50mg  three times daily.  Patient denies dizziness, fatigue, swelling or hypotension.  Noted some drowsiness after hydralazine 50mg  started but tolerates well now and not willing to decrease the medication at this time.  Current HTN meds:  Valsartan 320mg  daily Metoprolol tartrate 50mg  twice daily Hydralazine 50mg  three times daily Furosemide 20mg  daily PRN (for edema)  Previously tried:  Amlodipine - caused edema  BP goal: 140/90  Social History: denies tobacco, alcohol use  Home BP readings:  3 readings since medication changed  05/15/2016: 137/ 67 pulse 50  05/18/2016: 147/62 pulse 49  05/25/2016: 139/65 pulse 49  Wt Readings from Last 3 Encounters:  05/13/16 119 lb (54 kg)  04/26/16 119 lb (54 kg)  11/16/15 123 lb (55.8 kg)   BP Readings from Last 3 Encounters:  05/30/16 (!) 148/72  05/13/16 (!) 162/76  04/26/16 (!) 148/86   Pulse Readings from Last 3 Encounters:  05/30/16 (!) 58  05/13/16 (!) 58  04/26/16 62    Past Medical History:  Diagnosis Date  . Anemia    2013 transfusion times 3  . Anxiety   . Benign head tremor   . Bladder cancer (HCC) RECURRENT- HX TCC OF BLADDER  . Bronchitis    pt states has yearly  . Carpal tunnel syndrome on both sides   . Colon polyps    last colonoscopy when pt was around 40. No polyps reported but she did previously have small polyps  . Coronary artery disease CARDIOLOGIST- DR CRENSHAW-- LOV IN EPIC MAY 2013   MYOVIEW 08-20-2011--  NO ISCHEMIA-  IN EPIC    . Depressive disorder   . GERD (gastroesophageal reflux disease)   . H/O hiatal hernia    pt denies  . Hearing loss bilateral hearing aids  . History of GI bleed GASTRIC ULCER MARCH 2013--  BLOOD TRANSFUSION  . History of non-ST elevation myocardial infarction (NSTEMI) MARCH 2013--   +TROPONINS/  GI BLEED    MEDICAL MANAGEMENT pt states was informed she did not have a MI  . History of pulmonary embolism 2011  W/ THROMBOPHEBITIS-- RESOLVED  . HLD (hyperlipidemia)   . HTN (hypertension)   . LBBB (left bundle branch block)   . Numbness of fingers   . OA (osteoarthritis) HANDS AND JOINTS  . Osteoporosis   . Pleurisy    hx of   . Pneumonia    hx of   . PONV (postoperative nausea and vomiting)   . S/P dilatation of esophageal stricture   . SUI (stress urinary incontinence, female)     Current Outpatient Prescriptions on File Prior to Visit  Medication Sig Dispense Refill  . ALPRAZolam (XANAX) 0.5 MG tablet Take 0.5 mg by mouth 2 (two) times daily as needed (tremors).     . Ascorbic Acid (  VITAMIN C) 1000 MG tablet Take 1,000 mg by mouth daily.    Marland Kitchen aspirin 81 MG tablet Take 81 mg by mouth daily.    . Cholecalciferol (VITAMIN D3) 2000 UNITS TABS Take by mouth daily.    . furosemide (LASIX) 20 MG tablet Take 1 tablet (20 mg total) by mouth daily as needed. 30 tablet 6  . hydrALAZINE (APRESOLINE) 50 MG tablet Take 1 tablet (50 mg total) by mouth 3 (three) times daily. 270 tablet 3  . meclizine (ANTIVERT) 25 MG tablet Take 25 mg by mouth 3 (three) times daily as needed for dizziness.    . Melatonin 3 MG TABS Take by mouth at bedtime.    . metoprolol tartrate (LOPRESSOR) 25 MG tablet TAKE ONE TABLET TWICE DAILY 60 tablet 6  . omeprazole (PRILOSEC) 20 MG capsule Take 20 mg by mouth every morning.     . rosuvastatin (CRESTOR) 20 MG tablet Take 20 mg by mouth at bedtime.    . valsartan (DIOVAN) 320 MG tablet Take 320 mg by mouth every morning.     . vitamin B-12 (CYANOCOBALAMIN) 500 MCG  tablet Take 500 mcg by mouth daily.    . [DISCONTINUED] zolpidem (AMBIEN) 5 MG tablet Take 1 tablet (5 mg total) by mouth at bedtime as needed and may repeat dose one time if needed for sleep. 30 tablet 1   No current facility-administered medications on file prior to visit.     Allergies  Allergen Reactions  . Morphine And Related     Altered mental status    Blood pressure (!) 148/72, pulse (!) 58, SpO2 92 %.  Essential hypertension:  Blood pressure better controlled today and appropriate for a 83yo patient.  She is tolerating medication changes well.  Noted HR low at home but sustained in the high 50s in clinic and patient asymptomatic.  Will continue current therapy without changes at this time. Patient to follow up with cardiologist in 6 weeks and with HTN clinic as needed.  Betta Balla Rodriguez-Guzman PharmD, [facility] [place] [id number] 05/30/2016 4:56 PM

## 2016-05-30 NOTE — Patient Instructions (Addendum)
Return for a follow up appointment in as needed  Your blood pressure today is 148/72 pulse 58  Check your blood pressure at home daily (if able) and keep record of the readings.  Take your BP meds as follows: Valsartan 320mg  daily Metoprolol tartrate 50mg  twice daily Hydralazine 50mg  three times daily   Bring all of your meds, your BP cuff and your record of home blood pressures to your next appointment.  Exercise as you're able, try to walk approximately 30 minutes per day.  Keep salt intake to a minimum, especially watch canned and prepared boxed foods.  Eat more fresh fruits and vegetables and fewer canned items.  Avoid eating in fast food restaurants.    HOW TO TAKE YOUR BLOOD PRESSURE: . Rest 5 minutes before taking your blood pressure. .  Don't smoke or drink caffeinated beverages for at least 30 minutes before. . Take your blood pressure before (not after) you eat. . Sit comfortably with your back supported and both feet on the floor (don't cross your legs). . Elevate your arm to heart level on a table or a desk. . Use the proper sized cuff. It should fit smoothly and snugly around your bare upper arm. There should be enough room to slip a fingertip under the cuff. The bottom edge of the cuff should be 1 inch above the crease of the elbow. . Ideally, take 3 measurements at one sitting and record the average.

## 2016-07-08 ENCOUNTER — Telehealth: Payer: Self-pay | Admitting: Cardiology

## 2016-07-08 MED ORDER — METOPROLOL TARTRATE 25 MG PO TABS: 25.0000 mg | ORAL_TABLET | Freq: Two times a day (BID) | ORAL | 3 refills | Status: DC

## 2016-07-08 NOTE — Telephone Encounter (Signed)
Rx(s) sent to pharmacy electronically.  

## 2016-07-08 NOTE — Telephone Encounter (Signed)
New Message     *STAT* If patient is at the pharmacy, call can be transferred to refill team.   1. Which medications need to be refilled? (please list name of each medication and dose if known)  metoprolol tartrate (LOPRESSOR) 25 MG tablet TAKE ONE TABLET TWICE DAILY     2. Which pharmacy/location (including street and city if local pharmacy) is medication to be sent to?[facility] [phone]  3. Do they need a 30 day or 90 day supply? [place]

## 2016-07-10 ENCOUNTER — Other Ambulatory Visit: Payer: Self-pay | Admitting: Cardiology

## 2016-07-10 MED ORDER — METOPROLOL TARTRATE 50 MG PO TABS: 50.0000 mg | ORAL_TABLET | Freq: Two times a day (BID) | ORAL | 3 refills | Status: DC

## 2016-07-10 NOTE — Telephone Encounter (Signed)
LOV on 05/30/16, Raquel Rodriquesz-Guzman, RPH. Increased pt's Metoprolol 25 mg tablet twice daily, to Metoprolol 50 mg tablet twice daily. Medication was sent in wrong and I resent the right medication to pt's pharmacy. Confirmation received.

## 2016-07-22 ENCOUNTER — Encounter: Payer: Self-pay | Admitting: Cardiology

## 2016-07-26 DIAGNOSIS — K922 Gastrointestinal hemorrhage, unspecified: Secondary | ICD-10-CM | POA: Diagnosis not present

## 2016-07-26 DIAGNOSIS — Z1212 Encounter for screening for malignant neoplasm of rectum: Secondary | ICD-10-CM | POA: Diagnosis not present

## 2016-08-01 NOTE — Progress Notes (Signed)
HPI: FU CAD and hypertension. She is known to have left bundle branch block. Patient had left shoulder surgery in February 2013. She then developed a GI bleed and was readmitted in March. EGD revealed an ulcer and esophagitis. Cardiac markers were checked and her troponin was elevated. An echocardiogram showed an ejection fraction of 55%. There was mild hypokinesis of the inferior and inferoseptal myocardium. There was trace aortic insufficiency. There was mild to moderate mitral regurgitation. The left atrium was mildly dilated there was mild tricuspid regurgitation. After she recovered from her GI bleed a nuclear was performed in June of 2013 and this revealed an ejection fraction of 70% and normal perfusion. Since I last saw her, she denies dyspnea, chest pain, palpitations or syncope. Occasional mild pedal edema.  Current Outpatient Prescriptions  Medication Sig Dispense Refill  . ALPRAZolam (XANAX) 0.5 MG tablet Take 0.5 mg by mouth 2 (two) times daily as needed (tremors).     . Ascorbic Acid (VITAMIN C) 1000 MG tablet Take 1,000 mg by mouth daily.    Marland Kitchen aspirin 81 MG tablet Take 81 mg by mouth daily.    . Cholecalciferol (VITAMIN D3) 2000 UNITS TABS Take by mouth daily.    . furosemide (LASIX) 20 MG tablet Take 1 tablet (20 mg total) by mouth daily as needed. 30 tablet 6  . hydrALAZINE (APRESOLINE) 50 MG tablet Take 1 tablet (50 mg total) by mouth 3 (three) times daily. 270 tablet 3  . meclizine (ANTIVERT) 25 MG tablet Take 25 mg by mouth 3 (three) times daily as needed for dizziness.    . Melatonin 3 MG TABS Take by mouth at bedtime.    . metoprolol (LOPRESSOR) 50 MG tablet Take 1 tablet (50 mg total) by mouth 2 (two) times daily. 180 tablet 3  . omeprazole (PRILOSEC) 40 MG capsule Take 40 mg by mouth daily.    . rosuvastatin (CRESTOR) 20 MG tablet Take 20 mg by mouth at bedtime.    . valsartan (DIOVAN) 320 MG tablet Take 320 mg by mouth every morning.     . vitamin B-12  (CYANOCOBALAMIN) 500 MCG tablet Take 500 mcg by mouth daily.     No current facility-administered medications for this visit.      Past Medical History:  Diagnosis Date  . Anemia    2013 transfusion times 3  . Anxiety   . Benign head tremor   . Bladder cancer (HCC) RECURRENT- HX TCC OF BLADDER  . Bronchitis    pt states has yearly  . Carpal tunnel syndrome on both sides   . Colon polyps    last colonoscopy when pt was around 59. No polyps reported but she did previously have small polyps  . Coronary artery disease CARDIOLOGIST- DR Akeia Perot-- LOV IN EPIC MAY 2013   MYOVIEW 08-20-2011--  NO ISCHEMIA-  IN EPIC  . Depressive disorder   . GERD (gastroesophageal reflux disease)   . H/O hiatal hernia    pt denies  . Hearing loss bilateral hearing aids  . History of GI bleed GASTRIC ULCER MARCH 2013--  BLOOD TRANSFUSION  . History of non-ST elevation myocardial infarction (NSTEMI) MARCH 2013--   +TROPONINS/  GI BLEED    MEDICAL MANAGEMENT pt states was informed she did not have a MI  . History of pulmonary embolism 2011  W/ THROMBOPHEBITIS-- RESOLVED  . HLD (hyperlipidemia)   . HTN (hypertension)   . LBBB (left bundle branch block)   . Numbness of  fingers   . OA (osteoarthritis) HANDS AND JOINTS  . Osteoporosis   . Pleurisy    hx of   . Pneumonia    hx of   . PONV (postoperative nausea and vomiting)   . S/P dilatation of esophageal stricture   . SUI (stress urinary incontinence, female)     Past Surgical History:  Procedure Laterality Date  . APPENDECTOMY  1970   AND TUBAL LIGATION  . CARDIOVASCULAR STRESS TEST  08-20-2011  DR Pascha Fogal   NORMAL NUCLEAR STUDY (NO EXERCISE)/ BASELINE LBBB/ NO ISCHEMIA/ EF 70%/ LV WALL MOTION  AND FUNCTION NORMAL  . CARPAL TUNNEL RELEASE     BILATERAL  . CATARACT EXTRACTION W/ INTRAOCULAR LENS  IMPLANT, BILATERAL    . CYSTO/ BILATERAL RETROGRADE PYELOGRAM/ FULGERATION BLADDER TUMOR/ LEFT URETEROSCOPY  02-28-2009  DR PETERSON  . CYSTOSCOPY  W/ RETROGRADES  12/16/2011   Procedure: CYSTOSCOPY WITH RETROGRADE PYELOGRAM;  Surgeon: Franchot Gallo, MD;  Location: [facility];  Service: Urology;  Laterality: Bilateral;  45 mins requested for this case  C-ARM    . ESOPHAGOGASTRODUODENOSCOPY  05/23/2011   Procedure: ESOPHAGOGASTRODUODENOSCOPY (EGD);  Surgeon: Irene Shipper, MD;  Location: [facility] ENDOSCOPY;  Service: Endoscopy;  Laterality: N/A;  . KNEE ARTHROSCOPY  05   rt  . LAPAROSCOPIC CHOLECYSTECTOMY  1998  . RIGHT TOTAL KNEE REPLACEMENT  01-02-2004  . TOTAL HIP ARTHROPLASTY  07-01-2003   RIGHT HIP  . TOTAL KNEE ARTHROPLASTY  11-30-2007   LEFT KNEE  . TOTAL KNEE REVISION Right 12/15/2013   Procedure: RIGHT KNEE POLYETHYLENE  EXCHANGE;  Surgeon: Gearlean Alf, MD;  Location: [facility];  Service: Orthopedics;  Laterality: Right;  . TOTAL SHOULDER ARTHROPLASTY  05/09/2011   Procedure: TOTAL SHOULDER ARTHROPLASTY;  Surgeon: Marin Shutter, MD;  Location: [place];  Service: Orthopedics;  Laterality: Left;  Left Shoulder Total Arthroplasty   . TRANSTHORACIC ECHOCARDIOGRAM  05-24-2011   MILD LVH/ GRADE I DIASTOLIC DYSFUNTION/ EF 76%/ MILD HYPOKINESIS/ MILD TO MODERATE MITRAL REGURG./ MILD TRICUSPID REGURG./ MODERATE INCREASE SYSTOLIC PRESSURE, PULMONARY ARTERIES  . TRANSURETHRAL RESECTION OF BLADDER TUMOR  06-04-2005  &  11-09-2002   LOW GRADE NON-INVASIVE TRANSITIONAL CELL CARCINOMA    Social History   Social History  . Marital status: Widowed    Spouse name: N/A  . Number of children: 1  . Years of education: N/A   Occupational History  . retired    Social History Main Topics  . Smoking status: Never Smoker  . Smokeless tobacco: Never Used  . Alcohol use No  . Drug use: No  . Sexual activity: Not on file   Other Topics Concern  . Not on file   Social History Narrative  . No narrative on file    Family History  Problem Relation Age of Onset  . Heart disease Unknown   . Hypertension Unknown   . Heart  attack Unknown   . Stroke Unknown     ROS: no fevers or chills, productive cough, hemoptysis, dysphasia, odynophagia, melena, hematochezia, dysuria, hematuria, rash, seizure activity, orthopnea, PND, claudication. Remaining systems are negative.  Physical Exam: Well-developed well-nourished in no acute distress.  Skin is warm and dry.  HEENT is normal.  Neck is supple.  Chest is clear to auscultation with normal expansion.  Cardiovascular exam is regular rate and rhythm.  Abdominal exam nontender or distended. No masses palpated. Extremities show no edema. neuro grossly intact  A/P  1 coronary artery disease-continue aspirin, beta blocker and  statin. As outlined previously she had a myocardial infarction by enzymes in the setting of GI bleed. Follow-up nuclear study negative. We are treating medically.  2 hyperlipidemia-continue statin.  3 hypertension-blood pressure is now controlled. Continue present medications.  4 edema-continue Lasix as needed.  Kirk Ruths, MD

## 2016-08-06 ENCOUNTER — Ambulatory Visit (INDEPENDENT_AMBULATORY_CARE_PROVIDER_SITE_OTHER): Payer: Medicare Other | Admitting: Cardiology

## 2016-08-06 ENCOUNTER — Encounter: Payer: Self-pay | Admitting: Cardiology

## 2016-08-06 VITALS — BP 116/64 | HR 56 | Ht <= 58 in | Wt 117.0 lb

## 2016-08-06 DIAGNOSIS — I2510 Atherosclerotic heart disease of native coronary artery without angina pectoris: Secondary | ICD-10-CM | POA: Diagnosis not present

## 2016-08-06 DIAGNOSIS — E7800 Pure hypercholesterolemia, unspecified: Secondary | ICD-10-CM

## 2016-08-06 DIAGNOSIS — I10 Essential (primary) hypertension: Secondary | ICD-10-CM | POA: Diagnosis not present

## 2016-08-06 NOTE — Patient Instructions (Signed)
Your physician wants you to follow-up in: 6 MONTHS WITH DR CRENSHAW You will receive a reminder letter in the mail two months in advance. If you don't receive a letter, please call our office to schedule the follow-up appointment.   If you need a refill on your cardiac medications before your next appointment, please call your pharmacy.  

## 2016-08-08 DIAGNOSIS — K922 Gastrointestinal hemorrhage, unspecified: Secondary | ICD-10-CM | POA: Diagnosis not present

## 2016-08-08 DIAGNOSIS — D500 Iron deficiency anemia secondary to blood loss (chronic): Secondary | ICD-10-CM | POA: Diagnosis not present

## 2016-11-21 DIAGNOSIS — D61818 Other pancytopenia: Secondary | ICD-10-CM | POA: Diagnosis not present

## 2016-11-27 DIAGNOSIS — D472 Monoclonal gammopathy: Secondary | ICD-10-CM | POA: Diagnosis not present

## 2016-11-27 DIAGNOSIS — D61818 Other pancytopenia: Secondary | ICD-10-CM | POA: Diagnosis not present

## 2016-12-04 DIAGNOSIS — D61818 Other pancytopenia: Secondary | ICD-10-CM | POA: Diagnosis not present

## 2016-12-04 DIAGNOSIS — D472 Monoclonal gammopathy: Secondary | ICD-10-CM | POA: Diagnosis not present

## 2016-12-05 ENCOUNTER — Other Ambulatory Visit (HOSPITAL_COMMUNITY)
Admission: RE | Admit: 2016-12-05 | Disposition: A | Discharge status: Home / Self Care | Payer: Medicare Other | Source: Ambulatory Visit | Attending: Oncology | Admitting: Oncology

## 2016-12-05 DIAGNOSIS — D472 Monoclonal gammopathy: Secondary | ICD-10-CM | POA: Diagnosis not present

## 2016-12-17 DIAGNOSIS — C9000 Multiple myeloma not having achieved remission: Secondary | ICD-10-CM | POA: Diagnosis not present

## 2017-01-01 ENCOUNTER — Encounter (HOSPITAL_COMMUNITY): Payer: Self-pay

## 2017-01-01 LAB — CHROMOSOME ANALYSIS, BONE MARROW

## 2017-01-01 LAB — TISSUE HYBRIDIZATION TO NCBH

## 2017-01-14 DIAGNOSIS — R251 Tremor, unspecified: Secondary | ICD-10-CM | POA: Diagnosis not present

## 2017-01-14 DIAGNOSIS — C9000 Multiple myeloma not having achieved remission: Secondary | ICD-10-CM | POA: Diagnosis not present

## 2017-01-21 DIAGNOSIS — C9000 Multiple myeloma not having achieved remission: Secondary | ICD-10-CM | POA: Diagnosis not present

## 2017-01-21 DIAGNOSIS — M25552 Pain in left hip: Secondary | ICD-10-CM | POA: Diagnosis not present

## 2017-01-21 DIAGNOSIS — G251 Drug-induced tremor: Secondary | ICD-10-CM | POA: Diagnosis not present

## 2017-01-22 NOTE — Telephone Encounter (Signed)
01/22/2017 Received faxed medical records from [facility] for upcoming appointment with Dr. Stanford Breed on 02/11/2017.  Records given to [facility]. cbr

## 2017-01-23 DIAGNOSIS — C9000 Multiple myeloma not having achieved remission: Secondary | ICD-10-CM | POA: Diagnosis not present

## 2017-01-23 DIAGNOSIS — M25552 Pain in left hip: Secondary | ICD-10-CM | POA: Diagnosis not present

## 2017-01-27 ENCOUNTER — Telehealth: Payer: Self-pay | Admitting: *Deleted

## 2017-01-27 NOTE — Telephone Encounter (Signed)
NOTES FAXED TO [facility] °

## 2017-01-28 ENCOUNTER — Telehealth: Payer: Self-pay | Admitting: Cardiology

## 2017-01-28 NOTE — Telephone Encounter (Signed)
Received incoming records from [facility] for upcoming appointment on 02/11/17 @ 2:20 pm with Dr. Stanford Breed. Records given to [facility] in Medical Records. 01/28/17 ab

## 2017-01-29 ENCOUNTER — Encounter: Payer: Self-pay | Admitting: *Deleted

## 2017-01-30 NOTE — Progress Notes (Deleted)
HPI: FU CAD and hypertension. She is known to have left bundle branch block. Patient had left shoulder surgery in February 2013. She then developed a GI bleed and was readmitted in March. EGD revealed an ulcer and esophagitis. Cardiac markers were checked and her troponin was elevated. An echocardiogram showed an ejection fraction of 55%. There was mild hypokinesis of the inferior and inferoseptal myocardium. There was trace aortic insufficiency. There was mild to moderate mitral regurgitation. The left atrium was mildly dilated there was mild tricuspid regurgitation. After she recovered from her GI bleed a nuclear was performed in June of 2013 and this revealed an ejection fraction of 70% and normal perfusion. Since I last saw her,   Current Outpatient Medications  Medication Sig Dispense Refill  . ALPRAZolam (XANAX) 0.5 MG tablet Take 0.5 mg by mouth 2 (two) times daily as needed (tremors).     . Ascorbic Acid (VITAMIN C) 1000 MG tablet Take 1,000 mg by mouth daily.    Marland Kitchen aspirin 81 MG tablet Take 81 mg by mouth daily.    . Cholecalciferol (VITAMIN D3) 2000 UNITS TABS Take by mouth daily.    . furosemide (LASIX) 20 MG tablet Take 1 tablet (20 mg total) by mouth daily as needed. 30 tablet 6  . hydrALAZINE (APRESOLINE) 50 MG tablet Take 1 tablet (50 mg total) by mouth 3 (three) times daily. 270 tablet 3  . meclizine (ANTIVERT) 25 MG tablet Take 25 mg by mouth 3 (three) times daily as needed for dizziness.    . Melatonin 3 MG TABS Take by mouth at bedtime.    . metoprolol (LOPRESSOR) 50 MG tablet Take 1 tablet (50 mg total) by mouth 2 (two) times daily. 180 tablet 3  . omeprazole (PRILOSEC) 40 MG capsule Take 40 mg by mouth daily.    . rosuvastatin (CRESTOR) 20 MG tablet Take 20 mg by mouth at bedtime.    . valsartan (DIOVAN) 320 MG tablet Take 320 mg by mouth every morning.     . vitamin B-12 (CYANOCOBALAMIN) 500 MCG tablet Take 500 mcg by mouth daily.     No current facility-administered  medications for this visit.      Past Medical History:  Diagnosis Date  . Anemia    2013 transfusion times 3  . Anxiety   . Benign head tremor   . Bladder cancer (HCC) RECURRENT- HX TCC OF BLADDER  . Bronchitis    pt states has yearly  . Carpal tunnel syndrome on both sides   . Colon polyps    last colonoscopy when pt was around 57. No polyps reported but she did previously have small polyps  . Coronary artery disease CARDIOLOGIST- DR Sylvio Weatherall-- LOV IN EPIC MAY 2013   MYOVIEW 08-20-2011--  NO ISCHEMIA-  IN EPIC  . Depressive disorder   . GERD (gastroesophageal reflux disease)   . H/O hiatal hernia    pt denies  . Hearing loss bilateral hearing aids  . History of GI bleed GASTRIC ULCER MARCH 2013--  BLOOD TRANSFUSION  . History of non-ST elevation myocardial infarction (NSTEMI) MARCH 2013--   +TROPONINS/  GI BLEED    MEDICAL MANAGEMENT pt states was informed she did not have a MI  . History of pulmonary embolism 2011  W/ THROMBOPHEBITIS-- RESOLVED  . HLD (hyperlipidemia)   . HTN (hypertension)   . LBBB (left bundle branch block)   . Numbness of fingers   . OA (osteoarthritis) HANDS AND JOINTS  .  Osteoporosis   . Pleurisy    hx of   . Pneumonia    hx of   . PONV (postoperative nausea and vomiting)   . S/P dilatation of esophageal stricture   . SUI (stress urinary incontinence, female)     Past Surgical History:  Procedure Laterality Date  . APPENDECTOMY  1970   AND TUBAL LIGATION  . CARDIOVASCULAR STRESS TEST  08-20-2011  DR Marykay Mccleod   NORMAL NUCLEAR STUDY (NO EXERCISE)/ BASELINE LBBB/ NO ISCHEMIA/ EF 70%/ LV WALL MOTION  AND FUNCTION NORMAL  . CARPAL TUNNEL RELEASE     BILATERAL  . CATARACT EXTRACTION W/ INTRAOCULAR LENS  IMPLANT, BILATERAL    . CYSTO/ BILATERAL RETROGRADE PYELOGRAM/ FULGERATION BLADDER TUMOR/ LEFT URETEROSCOPY  02-28-2009  DR PETERSON  . CYSTOSCOPY W/ RETROGRADES  12/16/2011   Procedure: CYSTOSCOPY WITH RETROGRADE PYELOGRAM;  Surgeon: Franchot Gallo, MD;  Location: [facility];  Service: Urology;  Laterality: Bilateral;  45 mins requested for this case  C-ARM    . ESOPHAGOGASTRODUODENOSCOPY  05/23/2011   Procedure: ESOPHAGOGASTRODUODENOSCOPY (EGD);  Surgeon: Irene Shipper, MD;  Location: [facility] ENDOSCOPY;  Service: Endoscopy;  Laterality: N/A;  . KNEE ARTHROSCOPY  05   rt  . LAPAROSCOPIC CHOLECYSTECTOMY  1998  . RIGHT TOTAL KNEE REPLACEMENT  01-02-2004  . TOTAL HIP ARTHROPLASTY  07-01-2003   RIGHT HIP  . TOTAL KNEE ARTHROPLASTY  11-30-2007   LEFT KNEE  . TOTAL KNEE REVISION Right 12/15/2013   Procedure: RIGHT KNEE POLYETHYLENE  EXCHANGE;  Surgeon: Gearlean Alf, MD;  Location: [facility];  Service: Orthopedics;  Laterality: Right;  . TOTAL SHOULDER ARTHROPLASTY  05/09/2011   Procedure: TOTAL SHOULDER ARTHROPLASTY;  Surgeon: Marin Shutter, MD;  Location: [facility];  Service: Orthopedics;  Laterality: Left;  Left Shoulder Total Arthroplasty   . TRANSTHORACIC ECHOCARDIOGRAM  05-24-2011   MILD LVH/ GRADE I DIASTOLIC DYSFUNTION/ EF 99%/ MILD HYPOKINESIS/ MILD TO MODERATE MITRAL REGURG./ MILD TRICUSPID REGURG./ MODERATE INCREASE SYSTOLIC PRESSURE, PULMONARY ARTERIES  . TRANSURETHRAL RESECTION OF BLADDER TUMOR  06-04-2005  &  11-09-2002   LOW GRADE NON-INVASIVE TRANSITIONAL CELL CARCINOMA    Social History   Socioeconomic History  . Marital status: Widowed    Spouse name: Not on file  . Number of children: 1  . Years of education: Not on file  . Highest education level: Not on file  Social Needs  . Financial resource strain: Not on file  . Food insecurity - worry: Not on file  . Food insecurity - inability: Not on file  . Transportation needs - medical: Not on file  . Transportation needs - non-medical: Not on file  Occupational History  . Occupation: retired  Tobacco Use  . Smoking status: Never Smoker  . Smokeless tobacco: Never Used  Substance and Sexual Activity  . Alcohol use: No  . Drug use: No  . Sexual  activity: Not on file  Other Topics Concern  . Not on file  Social History Narrative  . Not on file    Family History  Problem Relation Age of Onset  . Heart disease Unknown   . Hypertension Unknown   . Heart attack Unknown   . Stroke Unknown     ROS: no fevers or chills, productive cough, hemoptysis, dysphasia, odynophagia, melena, hematochezia, dysuria, hematuria, rash, seizure activity, orthopnea, PND, pedal edema, claudication. Remaining systems are negative.  Physical Exam: Well-developed well-nourished in no acute distress.  Skin is warm and dry.  HEENT is normal. **Note De-Identified Morina Obfuscation** Neck is supple.  Chest is clear to auscultation with normal expansion.  Cardiovascular exam is regular rate and rhythm.  Abdominal exam nontender or distended. No masses palpated. Extremities show no edema. neuro grossly intact  ECG- personally reviewed  A/P  1  Kirk Ruths, MD

## 2017-02-10 ENCOUNTER — Telehealth: Payer: Self-pay | Admitting: Cardiology

## 2017-02-10 NOTE — Telephone Encounter (Signed)
Received incoming records from [facility] for upcoming appointment on 02/11/17 @ 2:20pm with Dr. Stanford Breed. Records given to [facility] in Medical Records. 02/10/17 ab

## 2017-02-11 ENCOUNTER — Ambulatory Visit: Payer: Medicare Other | Admitting: Cardiology

## 2017-02-11 ENCOUNTER — Telehealth: Payer: Self-pay | Admitting: Cardiology

## 2017-03-04 DIAGNOSIS — C9000 Multiple myeloma not having achieved remission: Secondary | ICD-10-CM | POA: Diagnosis not present

## 2017-04-09 ENCOUNTER — Other Ambulatory Visit: Payer: Self-pay | Admitting: Internal Medicine

## 2017-04-09 DIAGNOSIS — Z1231 Encounter for screening mammogram for malignant neoplasm of breast: Secondary | ICD-10-CM

## 2017-04-15 DIAGNOSIS — M170 Bilateral primary osteoarthritis of knee: Secondary | ICD-10-CM | POA: Diagnosis not present

## 2017-04-15 DIAGNOSIS — C9000 Multiple myeloma not having achieved remission: Secondary | ICD-10-CM | POA: Diagnosis not present

## 2017-04-29 ENCOUNTER — Ambulatory Visit
Admission: RE | Admit: 2017-04-29 | Discharge: 2017-04-29 | Disposition: A | Discharge status: Home / Self Care | Payer: Medicare Other | Source: Ambulatory Visit | Attending: Internal Medicine | Admitting: Internal Medicine

## 2017-04-29 DIAGNOSIS — Z1231 Encounter for screening mammogram for malignant neoplasm of breast: Secondary | ICD-10-CM

## 2017-05-16 ENCOUNTER — Other Ambulatory Visit: Payer: Self-pay | Admitting: Urology

## 2017-06-17 DIAGNOSIS — C9000 Multiple myeloma not having achieved remission: Secondary | ICD-10-CM

## 2017-06-17 DIAGNOSIS — G92 Toxic encephalopathy: Secondary | ICD-10-CM

## 2017-06-17 DIAGNOSIS — I10 Essential (primary) hypertension: Secondary | ICD-10-CM | POA: Diagnosis not present

## 2017-06-17 DIAGNOSIS — T380X5A Adverse effect of glucocorticoids and synthetic analogues, initial encounter: Secondary | ICD-10-CM

## 2017-06-17 DIAGNOSIS — I2510 Atherosclerotic heart disease of native coronary artery without angina pectoris: Secondary | ICD-10-CM | POA: Diagnosis not present

## 2017-06-17 DIAGNOSIS — K219 Gastro-esophageal reflux disease without esophagitis: Secondary | ICD-10-CM | POA: Diagnosis not present

## 2017-06-17 DIAGNOSIS — G9341 Metabolic encephalopathy: Secondary | ICD-10-CM

## 2017-06-17 DIAGNOSIS — E785 Hyperlipidemia, unspecified: Secondary | ICD-10-CM | POA: Diagnosis not present

## 2017-06-18 DIAGNOSIS — C9000 Multiple myeloma not having achieved remission: Secondary | ICD-10-CM | POA: Diagnosis not present

## 2017-06-18 DIAGNOSIS — G9341 Metabolic encephalopathy: Secondary | ICD-10-CM | POA: Diagnosis not present

## 2017-06-18 DIAGNOSIS — G92 Toxic encephalopathy: Secondary | ICD-10-CM | POA: Diagnosis not present

## 2017-06-18 DIAGNOSIS — T380X5A Adverse effect of glucocorticoids and synthetic analogues, initial encounter: Secondary | ICD-10-CM | POA: Diagnosis not present

## 2017-06-19 DIAGNOSIS — G92 Toxic encephalopathy: Secondary | ICD-10-CM | POA: Diagnosis not present

## 2017-06-19 DIAGNOSIS — T380X5A Adverse effect of glucocorticoids and synthetic analogues, initial encounter: Secondary | ICD-10-CM | POA: Diagnosis not present

## 2017-06-19 DIAGNOSIS — G9341 Metabolic encephalopathy: Secondary | ICD-10-CM | POA: Diagnosis not present

## 2017-06-19 DIAGNOSIS — C9000 Multiple myeloma not having achieved remission: Secondary | ICD-10-CM | POA: Diagnosis not present

## 2017-06-21 ENCOUNTER — Other Ambulatory Visit: Payer: Self-pay | Admitting: Cardiology

## 2017-06-23 ENCOUNTER — Other Ambulatory Visit: Payer: Self-pay | Admitting: Urology

## 2017-07-30 DIAGNOSIS — C9000 Multiple myeloma not having achieved remission: Secondary | ICD-10-CM | POA: Diagnosis not present

## 2017-07-31 ENCOUNTER — Ambulatory Visit (HOSPITAL_BASED_OUTPATIENT_CLINIC_OR_DEPARTMENT_OTHER): Admit: 2017-07-31 | Payer: Medicare Other | Admitting: Urology

## 2017-07-31 ENCOUNTER — Encounter (HOSPITAL_BASED_OUTPATIENT_CLINIC_OR_DEPARTMENT_OTHER): Payer: Self-pay

## 2017-07-31 SURGERY — TURBT (TRANSURETHRAL RESECTION OF BLADDER TUMOR)
Anesthesia: General

## 2017-08-05 ENCOUNTER — Ambulatory Visit: Payer: Self-pay | Admitting: Podiatry

## 2017-08-05 ENCOUNTER — Telehealth: Payer: Self-pay | Admitting: Cardiology

## 2017-08-05 NOTE — Telephone Encounter (Signed)
Received note from [facility] on 08/05/17, Appt 09/04/17 @ 1:20 PM. NV

## 2017-09-03 NOTE — Progress Notes (Signed)
HPI: FU CAD and hypertension. She is known to have left bundle branch block. Patient had left shoulder surgery in February 2013. She then developed a GI bleed and was readmitted in March. EGD revealed an ulcer and esophagitis. Cardiac markers were checked and her troponin was elevated. An echocardiogram showed an ejection fraction of 55%. There was mild hypokinesis of the inferior and inferoseptal myocardium. There was trace aortic insufficiency. There was mild to moderate mitral regurgitation. The left atrium was mildly dilated there was mild tricuspid regurgitation. After she recovered from her GI bleed a nuclear was performed in June of 2013 and this revealed an ejection fraction of 70% and normal perfusion. Pt recently diagnosed with multiple myeloma. Since I last saw her,  patient has problems with back pain related to scoliosis.  She denies dyspnea or chest pain.  She has mild pedal edema.  Current Outpatient Medications  Medication Sig Dispense Refill  . ALPRAZolam (XANAX) 0.5 MG tablet Take 0.5 mg by mouth 2 (two) times daily as needed (tremors).     . Ascorbic Acid (VITAMIN C) 1000 MG tablet Take 1,000 mg by mouth daily.    Marland Kitchen aspirin 81 MG tablet Take 81 mg by mouth daily.    . Cholecalciferol (VITAMIN D3) 2000 UNITS TABS Take by mouth daily.    . furosemide (LASIX) 20 MG tablet Take 1 tablet (20 mg total) by mouth daily as needed. 30 tablet 6  . hydrALAZINE (APRESOLINE) 50 MG tablet Take 1 tablet (50 mg total) by mouth 3 (three) times daily. 270 tablet 3  . meclizine (ANTIVERT) 25 MG tablet Take 25 mg by mouth 3 (three) times daily as needed for dizziness.    . Melatonin 3 MG TABS Take by mouth at bedtime.    . metoprolol tartrate (LOPRESSOR) 50 MG tablet TAKE ONE TABLET BY MOUTH TWICE DAILY 180 tablet 3  . omeprazole (PRILOSEC) 40 MG capsule Take 40 mg by mouth daily.    . rosuvastatin (CRESTOR) 20 MG tablet Take 20 mg by mouth at bedtime.    . valsartan (DIOVAN) 320 MG tablet  Take 320 mg by mouth every morning.     . vitamin B-12 (CYANOCOBALAMIN) 500 MCG tablet Take 500 mcg by mouth daily.     No current facility-administered medications for this visit.      Past Medical History:  Diagnosis Date  . Anemia    2013 transfusion times 3  . Anxiety   . Benign head tremor   . Bladder cancer (HCC) RECURRENT- HX TCC OF BLADDER  . Bronchitis    pt states has yearly  . Carpal tunnel syndrome on both sides   . Colon polyps    last colonoscopy when pt was around 41. No polyps reported but she did previously have small polyps  . Coronary artery disease CARDIOLOGIST- DR Taunja Brickner-- LOV IN EPIC MAY 2013   MYOVIEW 08-20-2011--  NO ISCHEMIA-  IN EPIC  . Depressive disorder   . GERD (gastroesophageal reflux disease)   . H/O hiatal hernia    pt denies  . Hearing loss bilateral hearing aids  . History of GI bleed GASTRIC ULCER MARCH 2013--  BLOOD TRANSFUSION  . History of non-ST elevation myocardial infarction (NSTEMI) MARCH 2013--   +TROPONINS/  GI BLEED    MEDICAL MANAGEMENT pt states was informed she did not have a MI  . History of pulmonary embolism 2011  W/ THROMBOPHEBITIS-- RESOLVED  . HLD (hyperlipidemia)   . HTN (hypertension)   .  LBBB (left bundle branch block)   . Numbness of fingers   . OA (osteoarthritis) HANDS AND JOINTS  . Osteoporosis   . Pleurisy    hx of   . Pneumonia    hx of   . PONV (postoperative nausea and vomiting)   . S/P dilatation of esophageal stricture   . SUI (stress urinary incontinence, female)     Past Surgical History:  Procedure Laterality Date  . APPENDECTOMY  1970   AND TUBAL LIGATION  . CARDIOVASCULAR STRESS TEST  08-20-2011  DR Ariza Evans   NORMAL NUCLEAR STUDY (NO EXERCISE)/ BASELINE LBBB/ NO ISCHEMIA/ EF 70%/ LV WALL MOTION  AND FUNCTION NORMAL  . CARPAL TUNNEL RELEASE     BILATERAL  . CATARACT EXTRACTION W/ INTRAOCULAR LENS  IMPLANT, BILATERAL    . CYSTO/ BILATERAL RETROGRADE PYELOGRAM/ FULGERATION BLADDER TUMOR/  LEFT URETEROSCOPY  02-28-2009  DR PETERSON  . CYSTOSCOPY W/ RETROGRADES  12/16/2011   Procedure: CYSTOSCOPY WITH RETROGRADE PYELOGRAM;  Surgeon: Franchot Gallo, MD;  Location: [facility];  Service: Urology;  Laterality: Bilateral;  45 mins requested for this case  C-ARM    . ESOPHAGOGASTRODUODENOSCOPY  05/23/2011   Procedure: ESOPHAGOGASTRODUODENOSCOPY (EGD);  Surgeon: Irene Shipper, MD;  Location: [facility] ENDOSCOPY;  Service: Endoscopy;  Laterality: N/A;  . KNEE ARTHROSCOPY  05   rt  . LAPAROSCOPIC CHOLECYSTECTOMY  1998  . RIGHT TOTAL KNEE REPLACEMENT  01-02-2004  . TOTAL HIP ARTHROPLASTY  07-01-2003   RIGHT HIP  . TOTAL KNEE ARTHROPLASTY  11-30-2007   LEFT KNEE  . TOTAL KNEE REVISION Right 12/15/2013   Procedure: RIGHT KNEE POLYETHYLENE  EXCHANGE;  Surgeon: Gearlean Alf, MD;  Location: [facility];  Service: Orthopedics;  Laterality: Right;  . TOTAL SHOULDER ARTHROPLASTY  05/09/2011   Procedure: TOTAL SHOULDER ARTHROPLASTY;  Surgeon: Marin Shutter, MD;  Location: [place];  Service: Orthopedics;  Laterality: Left;  Left Shoulder Total Arthroplasty   . TRANSTHORACIC ECHOCARDIOGRAM  05-24-2011   MILD LVH/ GRADE I DIASTOLIC DYSFUNTION/ EF 22%/ MILD HYPOKINESIS/ MILD TO MODERATE MITRAL REGURG./ MILD TRICUSPID REGURG./ MODERATE INCREASE SYSTOLIC PRESSURE, PULMONARY ARTERIES  . TRANSURETHRAL RESECTION OF BLADDER TUMOR  06-04-2005  &  11-09-2002   LOW GRADE NON-INVASIVE TRANSITIONAL CELL CARCINOMA    Social History   Socioeconomic History  . Marital status: Widowed    Spouse name: Not on file  . Number of children: 1  . Years of education: Not on file  . Highest education level: Not on file  Occupational History  . Occupation: retired  Scientific laboratory technician  . Financial resource strain: Not on file  . Food insecurity:    Worry: Not on file    Inability: Not on file  . Transportation needs:    Medical: Not on file    Non-medical: Not on file  Tobacco Use  . Smoking status: Never  Smoker  . Smokeless tobacco: Never Used  Substance and Sexual Activity  . Alcohol use: No  . Drug use: No  . Sexual activity: Not on file  Lifestyle  . Physical activity:    Days per week: Not on file    Minutes per session: Not on file  . Stress: Not on file  Relationships  . Social connections:    Talks on phone: Not on file    Gets together: Not on file    Attends religious service: Not on file    Active member of club or organization: Not on file    Attends  meetings of clubs or organizations: Not on file    Relationship status: Not on file  . Intimate partner violence:    Fear of current or ex partner: Not on file    Emotionally abused: Not on file    Physically abused: Not on file    Forced sexual activity: Not on file  Other Topics Concern  . Not on file  Social History Narrative  . Not on file    Family History  Problem Relation Age of Onset  . Heart disease Unknown   . Hypertension Unknown   . Heart attack Unknown   . Stroke Unknown     ROS: Back pain, worsening tremor, weakness and decreased appetite but no fevers or chills, productive cough, hemoptysis, dysphasia, odynophagia, melena, hematochezia, dysuria, hematuria, rash, seizure activity, orthopnea, PND, claudication. Remaining systems are negative.  Physical Exam: Well-developed frail in no acute distress.  Skin is warm and dry.  HEENT is normal.  Neck is supple.  Chest is clear to auscultation with normal expansion.  Cardiovascular exam is regular rate and rhythm.  Abdominal exam nontender or distended. No masses palpated. Extremities show trace edema. neuro grossly intact  ECG-sinus rhythm at a rate of 75.  First-degree AV block.  Left bundle branch block.  Personally reviewed  A/P  1 coronary artery disease-patient denies chest pain.  She is felt to have coronary disease as she had elevated troponins previously in the setting of GI bleed.  However follow-up nuclear study was negative and we are  treating conservatively.  Continue aspirin, beta-blocker and statin.  2 hypertension-blood pressure is controlled.  Continue present medications.  3 hyperlipidemia-continue statin.  4 lower extremity edema-patient given prescription for refill of Lasix 20 mg daily as needed.  Kirk Ruths, MD

## 2017-09-04 ENCOUNTER — Ambulatory Visit: Payer: Medicare Other | Admitting: Cardiology

## 2017-09-04 ENCOUNTER — Encounter: Payer: Self-pay | Admitting: Cardiology

## 2017-09-04 VITALS — BP 110/60 | HR 75 | Ht <= 58 in | Wt 105.0 lb

## 2017-09-04 DIAGNOSIS — E7800 Pure hypercholesterolemia, unspecified: Secondary | ICD-10-CM

## 2017-09-04 DIAGNOSIS — I10 Essential (primary) hypertension: Secondary | ICD-10-CM

## 2017-09-04 DIAGNOSIS — I2510 Atherosclerotic heart disease of native coronary artery without angina pectoris: Secondary | ICD-10-CM

## 2017-09-04 MED ORDER — FUROSEMIDE 20 MG PO TABS: 20.0000 mg | ORAL_TABLET | Freq: Every day | ORAL | 6 refills | Status: AC | PRN

## 2017-09-04 NOTE — Patient Instructions (Signed)
Your physician recommends that you schedule a follow-up appointment in: 6 MONTHS WITH DR CRENSHAW  If you need a refill on your cardiac medications before your next appointment, please call your pharmacy.   

## 2017-09-24 ENCOUNTER — Other Ambulatory Visit: Payer: Self-pay | Admitting: Pharmacist

## 2017-09-24 NOTE — Patient Outreach (Signed)
[facility]) Care Management  09/24/2017  Christina [name] Sep 28, 1925 [id number]   Incoming call from Ottis Stain in response to the [facility] Medication Adherence Campaign. Speak with patient. HIPAA identifiers verified. [name] asks that I speak with her caregiver, Kennyth Lose, who is present with her.   Kennyth Lose reports that the patient is taking both her valsartan and rosuvastatin once daily as directed. Denies any missed doses. Reports that the patient is now using a pill package to help insure her medication adherence. Counsel on the importance of adherence to these medications.   Kennyth Lose denies any medication questions/concerns on behalf of [name]. Will close pharmacy episode at this time.  Harlow Asa, PharmD, [place] [facility] [phone]

## 2017-10-15 DIAGNOSIS — C9000 Multiple myeloma not having achieved remission: Secondary | ICD-10-CM

## 2017-10-20 ENCOUNTER — Telehealth: Payer: Self-pay | Admitting: Cardiology

## 2017-10-20 NOTE — Telephone Encounter (Signed)
Received records from [facility] on 10/20/17, Appt 03/02/18 @ 1:20PM. NV

## 2018-01-22 DIAGNOSIS — R627 Adult failure to thrive: Secondary | ICD-10-CM

## 2018-01-22 DIAGNOSIS — C9000 Multiple myeloma not having achieved remission: Secondary | ICD-10-CM

## 2018-01-22 DIAGNOSIS — R4182 Altered mental status, unspecified: Secondary | ICD-10-CM | POA: Diagnosis not present

## 2018-03-02 ENCOUNTER — Ambulatory Visit: Payer: Medicare Other | Admitting: Cardiology

## 2018-04-09 ENCOUNTER — Ambulatory Visit: Payer: Medicare Other | Admitting: Neurology

## 2018-04-18 DEATH — deceased
